# Patient Record
Sex: Female | Born: 1995
Health system: Southern US, Community
[De-identification: ages and names within clinical notes are randomized; demographics above are authoritative.]

## PROBLEM LIST (undated history)

## (undated) DIAGNOSIS — I498 Other specified cardiac arrhythmias: Secondary | ICD-10-CM

## (undated) DIAGNOSIS — I951 Orthostatic hypotension: Secondary | ICD-10-CM

## (undated) DIAGNOSIS — G90A Postural orthostatic tachycardia syndrome (POTS): Secondary | ICD-10-CM

## (undated) DIAGNOSIS — F319 Bipolar disorder, unspecified: Secondary | ICD-10-CM

## (undated) DIAGNOSIS — R000 Tachycardia, unspecified: Secondary | ICD-10-CM

## (undated) DIAGNOSIS — I341 Nonrheumatic mitral (valve) prolapse: Secondary | ICD-10-CM

## (undated) HISTORY — PX: EXTRACORPOREAL CIRCULATION: SHX266

## (undated) HISTORY — PX: COLPOSCOPY: SHX161

## (undated) HISTORY — DX: Nonrheumatic mitral (valve) prolapse: I34.1

---
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
## 2013-08-16 ENCOUNTER — Other Ambulatory Visit: Payer: Self-pay

## 2013-11-04 ENCOUNTER — Emergency Department (HOSPITAL_COMMUNITY)
Admission: EM | Admit: 2013-11-04 | Discharge: 2013-11-04 | Disposition: A | Discharge status: Home / Self Care | Payer: 59 | Attending: Emergency Medicine | Admitting: Emergency Medicine

## 2013-11-04 ENCOUNTER — Encounter (HOSPITAL_COMMUNITY): Payer: Self-pay | Admitting: Emergency Medicine

## 2013-11-04 DIAGNOSIS — Z3202 Encounter for pregnancy test, result negative: Secondary | ICD-10-CM | POA: Insufficient documentation

## 2013-11-04 DIAGNOSIS — Z8679 Personal history of other diseases of the circulatory system: Secondary | ICD-10-CM | POA: Insufficient documentation

## 2013-11-04 DIAGNOSIS — R45851 Suicidal ideations: Secondary | ICD-10-CM | POA: Insufficient documentation

## 2013-11-04 DIAGNOSIS — F319 Bipolar disorder, unspecified: Secondary | ICD-10-CM | POA: Insufficient documentation

## 2013-11-04 DIAGNOSIS — F39 Unspecified mood [affective] disorder: Secondary | ICD-10-CM | POA: Insufficient documentation

## 2013-11-04 DIAGNOSIS — IMO0002 Reserved for concepts with insufficient information to code with codable children: Secondary | ICD-10-CM | POA: Insufficient documentation

## 2013-11-04 DIAGNOSIS — F329 Major depressive disorder, single episode, unspecified: Secondary | ICD-10-CM | POA: Insufficient documentation

## 2013-11-04 DIAGNOSIS — F32A Depression, unspecified: Secondary | ICD-10-CM

## 2013-11-04 DIAGNOSIS — F3289 Other specified depressive episodes: Secondary | ICD-10-CM | POA: Insufficient documentation

## 2013-11-04 HISTORY — DX: Bipolar disorder, unspecified: F31.9

## 2013-11-04 LAB — POCT I-STAT, CHEM 8
BUN: 5 mg/dL — AB (ref 6–23)
CREATININE: 1 mg/dL (ref 0.50–1.10)
Calcium, Ion: 1.25 mmol/L — ABNORMAL HIGH (ref 1.12–1.23)
Chloride: 103 mEq/L (ref 96–112)
Glucose, Bld: 107 mg/dL — ABNORMAL HIGH (ref 70–99)
HCT: 44 % (ref 36.0–46.0)
Hemoglobin: 15 g/dL (ref 12.0–15.0)
Potassium: 3.5 mEq/L — ABNORMAL LOW (ref 3.7–5.3)
SODIUM: 146 meq/L (ref 137–147)
TCO2: 26 mmol/L (ref 0–100)

## 2013-11-04 LAB — CBC WITH DIFFERENTIAL/PLATELET
Basophils Absolute: 0 10*3/uL (ref 0.0–0.1)
Basophils Relative: 0 % (ref 0–1)
Eosinophils Absolute: 0.1 10*3/uL (ref 0.0–0.7)
Eosinophils Relative: 1 % (ref 0–5)
HCT: 38.8 % (ref 36.0–46.0)
Hemoglobin: 13.2 g/dL (ref 12.0–15.0)
LYMPHS ABS: 2 10*3/uL (ref 0.7–4.0)
LYMPHS PCT: 40 % (ref 12–46)
MCH: 29.7 pg (ref 26.0–34.0)
MCHC: 34 g/dL (ref 30.0–36.0)
MCV: 87.2 fL (ref 78.0–100.0)
Monocytes Absolute: 0.3 10*3/uL (ref 0.1–1.0)
Monocytes Relative: 7 % (ref 3–12)
NEUTROS ABS: 2.6 10*3/uL (ref 1.7–7.7)
NEUTROS PCT: 52 % (ref 43–77)
PLATELETS: 284 10*3/uL (ref 150–400)
RBC: 4.45 MIL/uL (ref 3.87–5.11)
RDW: 11.9 % (ref 11.5–15.5)
WBC: 5 10*3/uL (ref 4.0–10.5)

## 2013-11-04 LAB — ETHANOL: ALCOHOL ETHYL (B): 131 mg/dL — AB (ref 0–11)

## 2013-11-04 LAB — RAPID URINE DRUG SCREEN, HOSP PERFORMED
AMPHETAMINES: NOT DETECTED
BENZODIAZEPINES: NOT DETECTED
Barbiturates: NOT DETECTED
Cocaine: NOT DETECTED
OPIATES: NOT DETECTED
Tetrahydrocannabinol: NOT DETECTED

## 2013-11-04 LAB — PREGNANCY, URINE: Preg Test, Ur: NEGATIVE

## 2013-11-04 MED ORDER — ONDANSETRON HCL 4 MG PO TABS
4.0000 mg | ORAL_TABLET | Freq: Three times a day (TID) | ORAL | Status: DC | PRN
  Administered 2013-11-04: 4 mg via ORAL
  Filled 2013-11-04: qty 1

## 2013-11-04 MED ORDER — NICOTINE 21 MG/24HR TD PT24
21.0000 mg | MEDICATED_PATCH | Freq: Every day | TRANSDERMAL | Status: DC
  Filled 2013-11-04: qty 1

## 2013-11-04 NOTE — ED Provider Notes (Signed)
Medical screening examination/treatment/procedure(s) were performed by non-physician practitioner and as supervising physician I was immediately available for consultation/collaboration.    Kanija Remmel, MD 11/04/13 0714 

## 2013-11-04 NOTE — BH Assessment (Signed)
Clinician consulted with Alberteen SamFran Hobson NP who states pt does not meet criteria for inpatient admission. Clinician provided recommendation to Dr. Theodoro Kalataptiz who agreed. Pt contracted for safety with clinician and was discharged to follow up with Out-patient referral to [facility] where pt receives medication management services.  Victoria [name], MSW, LCSW Triage Specialist [phone]

## 2013-11-04 NOTE — ED Notes (Signed)
Per pt report: Pt c/o of feeling depressed for the past year.  Pt endorses ETOH use this evening. Pt currently denies SI/HI.  Pt denies pain.  Pt reports seeing psychiatry for control of her bipolar disorder.  Pt tearful in triage.

## 2013-11-04 NOTE — BH Assessment (Signed)
Clinician consulted with Dondra SpryGail, NP assigned to Pt for further information prior to assessment. Contacted assigned nurse to set up tele- assessment.  Assessment will be initiated.  Yaakov Guthrieelilah Stewart, MSW, LCSW Triage Specialist [phone]

## 2013-11-04 NOTE — ED Provider Notes (Signed)
CSN: [id number]     Arrival date & time 11/04/13  0210 History   First MD Initiated Contact with Patient 11/04/13 0254     Chief Complaint  Patient presents with  . Medical Clearance   (Consider location/radiation/quality/duration/timing/severity/associated sxs/prior Treatment) HPI Comments: This is a 18 year old female, who has had depression for the past year.  Tonight.  She drank alcohol She states she saw her therapist.  Burgess Estelle.  He was cutting down on her Risperdal has been making her too sleepy, but continued with her Lamictal at the same dosage.  She states this has been helping her, but she's been having crying episodes with increased depression for while. She can't put a trigger to her increased crying episodes.  She is suicidal without a specific plan  The history is provided by the patient.    Past Medical History  Diagnosis Date  . POTS (postural orthostatic tachycardia syndrome)   . Bipolar disorder    Past Surgical History  Procedure Laterality Date  . Extracorporeal circulation     No family history on file. History  Substance Use Topics  . Smoking status: Never Smoker   . Smokeless tobacco: Not on file  . Alcohol Use: No   OB History   Grav Para Term Preterm Abortions TAB SAB Ect Mult Living                 Review of Systems  Constitutional: Negative for fever.  Respiratory: Negative for cough and shortness of breath.   Cardiovascular: Negative for chest pain.  Neurological: Negative for headaches.  Psychiatric/Behavioral: Positive for suicidal ideas and dysphoric mood.  All other systems reviewed and are negative.    Allergies  Review of patient's allergies indicates no known allergies.  Home Medications   Current Outpatient Rx  Name  Route  Sig  Dispense  Refill  . fludrocortisone (FLORINEF) 0.1 MG tablet   Oral   Take 0.2 mg by mouth daily.          Marland Kitchen lamoTRIgine (LAMICTAL) 200 MG tablet   Oral   Take 200 mg by mouth at bedtime.         . risperiDONE (RISPERDAL) 1 MG tablet   Oral   Take 1 mg by mouth at bedtime.          BP 122/58  Pulse 109  Temp(Src) 98 F (36.7 C) (Oral)  Resp 18  SpO2 100%  LMP 10/07/2013 Physical Exam  Nursing note and vitals reviewed. Constitutional: She is oriented to person, place, and time. She appears well-developed and well-nourished. No distress.  HENT:  Head: Normocephalic and atraumatic.  Eyes: Pupils are equal, round, and reactive to light.  Neck: Normal range of motion.  Cardiovascular: Normal rate.   Pulmonary/Chest: Effort normal.  Musculoskeletal: Normal range of motion.  Neurological: She is alert and oriented to person, place, and time.  Skin: Skin is warm.  Psychiatric: Her speech is normal and behavior is normal. Judgment normal. Cognition and memory are normal. She exhibits a depressed mood. She expresses suicidal ideation. She expresses no suicidal plans.    ED Course  Procedures (including critical care time) Labs Review Labs Reviewed  ETHANOL - Abnormal; Notable for the following:    Alcohol, Ethyl (B) 131 (*)    All other components within normal limits  POCT I-STAT, CHEM 8 - Abnormal; Notable for the following:    Potassium 3.5 (*)    BUN 5 (*)    Glucose, Bld 107 (*)  Calcium, Ion 1.25 (*)    All other components within normal limits  CBC WITH DIFFERENTIAL  URINE RAPID DRUG SCREEN (HOSP PERFORMED)  PREGNANCY, URINE   Imaging Review No results found.  EKG Interpretation   None       MDM   1. Depression     Will have this patient evaluated by TTS TTS evaluation.  Patient contracts for safety.  I recommend she call her counselor first thing in the morning.  Return anytime she becomes concerned about her overall health, depression, increases, or she becomes suicidal   Arman FilterGail K Kenzlie Disch, NP 11/04/13 [phone]

## 2013-11-04 NOTE — Discharge Instructions (Signed)
Depression, Adult Depression is feeling sad, low, down in the dumps, blue, gloomy, or empty. In general, there are two kinds of depression:  Normal sadness or grief. This can happen after something upsetting. It often goes away on its own within 2 weeks. After losing a loved one (bereavement), normal sadness and grief may last longer than two weeks. It usually gets better with time.  Clinical depression. This kind lasts longer than normal sadness or grief. It keeps you from doing the things you normally do in life. It is often hard to function at home, work, or at school. It may affect your relationships with others. Treatment is often needed. GET HELP RIGHT AWAY IF:  You have thoughts about hurting yourself or others.  You lose touch with reality (psychotic symptoms). You may:  See or hear things that are not real.  Have untrue beliefs about your life or people around you.  Your medicine is giving you problems. MAKE SURE YOU:  Understand these instructions.  Will watch your condition.  Will get help right away if you are not doing well or get worse. Document Released: 10/30/2010 Document Revised: 06/21/2012 Document Reviewed: 01/27/2012 [facility] Patient Information 2014 [place]. Please call your counselor today.  Continue with your medication regime.  Return at anytime, you become acutely depressed or suicidal

## 2013-11-04 NOTE — ED Notes (Signed)
Pt states the Zofran helped her nausea

## 2013-11-04 NOTE — BH Assessment (Signed)
Tele Assessment Note   Marjory LiesMorgan M Sapp is an 18 y.o. female who [facility] depressive symptoms and SI.  At start of assessment Pt reported she was no longer experiencing SI.  She reported she was depressed and dx with anxiety and Bipolar. Pt reported her stressors were triggered by her fear of not performing well academically. Pt reported she was currently in school [facility] [employer]. She lives at home with her parents.  Pt reports she had some drinks tonight, which could have exacerbated her symptoms. She reported drinking about a 1/4 of bottle of liquor tonight.  Pt reported that for about a year now she has been depressed and experiencing symptoms of anxiety, increased sleep, decreased appetite, tearfulness, fatigue, guilt, anhedonia and irritability.  Pt reported experiencing anxiety attacks with most recent 1 day ago.    Pt was calm and cooperative.Pt was Ox4. Pt reported depressed mood with congruent affect.  Pt lives in the home with her parents.    Pt denied HI and any past suicide attempts. Pt denies A/V/H. She reports she is prescribed Risperdal and Lamictal [facility]  Pt denied past hospitalizations.  She reported having counseling at school which ended in November 2014.  Pt was able to contract for safety.  Axis I: Anxiety Disorder NOS and Major Depression, single episode Axis II: Deferred Axis III:  Past Medical History  Diagnosis Date  . POTS (postural orthostatic tachycardia syndrome)   . Bipolar disorder    Axis IV: academic concerns Axis V: 55  Past Medical History:  Past Medical History  Diagnosis Date  . POTS (postural orthostatic tachycardia syndrome)   . Bipolar disorder     Past Surgical History  Procedure Laterality Date  . Extracorporeal circulation      Family History: No family history on file.  Social History:  reports that she has never smoked. She does not have any smokeless tobacco history on file. She  reports that she drinks alcohol. She reports that she does not use illicit drugs.  Additional Social History:  Alcohol / Drug Use Pain Medications: none reported Prescriptions: Risperdal; Lamictal Over the Counter: none reported History of alcohol / drug use?: Yes Longest period of sobriety (when/how long): unk Substance #1 Name of Substance 1: ETOH 1 - Age of First Use: 16 1 - Amount (size/oz): 1/4 bottle of liquor 1 - Frequency: one occurence in a few months 1 - Duration: 9 months 1 - Last Use / Amount: 2 hours ago  CIWA: CIWA-Ar BP: 100/52 mmHg Pulse Rate: 92 COWS:    Allergies: No Known Allergies  Home Medications:  (Not in a hospital admission)  OB/GYN Status:  Patient's last menstrual period was 10/07/2013.  General Assessment Data Location of Assessment: [facility] Services Is this a Tele or Face-to-Face Assessment?: Tele Assessment Is this an Initial Assessment or a Re-assessment for this encounter?: Initial Assessment Living Arrangements: Parent Can pt return to current living arrangement?: Yes Admission Status: Voluntary Is patient capable of signing voluntary admission?: Yes Transfer from: Acute Hospital Referral Source: Self/Family/Friend  Medical Screening Exam [facility] Walk-in ONLY) Medical Exam completed: Yes  [facility] Living Arrangements: Parent Name of Psychiatrist:  (none) Name of Therapist:  (none)  Education Status Is patient currently in school?: Yes Current Grade:  (college courses) Highest grade of school patient has completed: 12 Name of school:  ([employer]) Contact person:  (none)  Risk to self Suicidal Ideation: No Suicidal Intent:  No Is patient at risk for suicide?: No Suicidal Plan?: No Access to Means: No What has been your use of drugs/alcohol within the last 12 months?:  (ETOH) Previous Attempts/Gestures: No How many times?: 0 Other Self Harm Risks:  (none) Triggers for Past Attempts:  (NA) Intentional Self  Injurious Behavior: None Family Suicide History: Unknown Recent stressful life event(s): Other (Comment) (Pt reports being stressed about academic performance. ) Persecutory voices/beliefs?: No Depression: Yes Depression Symptoms: Despondent;Tearfulness;Isolating;Fatigue;Guilt;Loss of interest in usual pleasures;Feeling worthless/self pity;Feeling angry/irritable Substance abuse history and/or treatment for substance abuse?: No Suicide prevention information given to non-admitted patients: Yes  Risk to Others Homicidal Ideation: No Thoughts of Harm to Others: No Current Homicidal Intent: No Current Homicidal Plan: No Access to Homicidal Means: No Identified Victim:  (NA) History of harm to others?: No Assessment of Violence: None Noted Violent Behavior Description:  (Pt was calm and cooperative. ) Does patient have access to weapons?: No Criminal Charges Pending?: No Does patient have a court date: No  Psychosis Hallucinations: None noted Delusions: None noted  Mental Status Report Appear/Hygiene:  (Pt wearing paper scrubs. ) Eye Contact: Good Motor Activity: Unremarkable Speech: Logical/coherent Level of Consciousness: Alert Mood: Depressed Affect: Depressed Anxiety Level: Moderate Thought Processes: Coherent;Relevant Judgement: Unimpaired Orientation: Person;Place;Situation;Time Obsessive Compulsive Thoughts/Behaviors: None  Cognitive Functioning Concentration: Normal Memory: Recent Intact;Remote Intact IQ: Average Insight: Fair Impulse Control: Fair Appetite:  (Pt reported decreased appetite. ) Weight Loss: 10 Weight Gain: 0 Sleep: Increased Total Hours of Sleep: 12 Vegetative Symptoms: None  ADLScreening [facility] Assessment Services) Patient's cognitive ability adequate to safely complete daily activities?: Yes Patient able to express need for assistance with ADLs?: Yes Independently performs ADLs?: Yes (appropriate for developmental age)  Prior Inpatient  Therapy Prior Inpatient Therapy: No Prior Therapy Dates:  (NA) Prior Therapy Facilty/Provider(s):  (NA) Reason for Treatment:  (NA)  Prior Outpatient Therapy Prior Outpatient Therapy: Yes Prior Therapy Dates:  (08/2013) Prior Therapy Facilty/Provider(s):  (Pt reported receving counseling at school.) Reason for Treatment:  (depression)  ADL Screening (condition at time of admission) Patient's cognitive ability adequate to safely complete daily activities?: Yes Is the patient deaf or have difficulty hearing?: No Does the patient have difficulty seeing, even when wearing glasses/contacts?: No Does the patient have difficulty concentrating, remembering, or making decisions?: No Patient able to express need for assistance with ADLs?: Yes Does the patient have difficulty dressing or bathing?: No Independently performs ADLs?: Yes (appropriate for developmental age) Does the patient have difficulty walking or climbing stairs?: No       Abuse/Neglect Assessment (Assessment to be complete while patient is alone) Physical Abuse: Denies Verbal Abuse: Denies Sexual Abuse: Denies Exploitation of patient/patient's resources: Denies Self-Neglect: Denies Values / Beliefs Cultural Requests During Hospitalization: None Spiritual Requests During Hospitalization: None   Advance Directives (For Healthcare) Advance Directive: Patient does not have advance directive    Additional Information 1:1 In Past 12 Months?: No CIRT Risk: No Elopement Risk: No Does patient have medical clearance?: Yes    Disposition: Clinician consulted with Alberteen Sam NP who states pt does not meet criteria for inpatient admission. Pt contracted for safety with clinician and was discharged to follow up with Out-patient referral to [facility] where pt receives medication management services. Yaakov Guthrie, MSW, LCSW Triage Specialist [phone]  Disposition:  Disposition Initial Assessment  Completed for this Encounter: Yes Disposition of Patient: Outpatient treatment Type of outpatient treatment: Adult  Stewart,Geraline Halberstadt R 11/04/2013 5:08 AM

## 2013-12-23 ENCOUNTER — Emergency Department (HOSPITAL_COMMUNITY)
Admission: EM | Admit: 2013-12-23 | Discharge: 2013-12-23 | Disposition: A | Discharge status: Home / Self Care | Payer: 59 | Attending: Emergency Medicine | Admitting: Emergency Medicine

## 2013-12-23 ENCOUNTER — Encounter (HOSPITAL_COMMUNITY): Payer: Self-pay | Admitting: Emergency Medicine

## 2013-12-23 DIAGNOSIS — IMO0002 Reserved for concepts with insufficient information to code with codable children: Secondary | ICD-10-CM | POA: Insufficient documentation

## 2013-12-23 DIAGNOSIS — I498 Other specified cardiac arrhythmias: Secondary | ICD-10-CM | POA: Insufficient documentation

## 2013-12-23 DIAGNOSIS — J069 Acute upper respiratory infection, unspecified: Secondary | ICD-10-CM

## 2013-12-23 DIAGNOSIS — R110 Nausea: Secondary | ICD-10-CM | POA: Insufficient documentation

## 2013-12-23 DIAGNOSIS — F319 Bipolar disorder, unspecified: Secondary | ICD-10-CM | POA: Insufficient documentation

## 2013-12-23 DIAGNOSIS — Z79899 Other long term (current) drug therapy: Secondary | ICD-10-CM | POA: Insufficient documentation

## 2013-12-23 NOTE — ED Provider Notes (Signed)
Medical screening examination/treatment/procedure(s) were performed by non-physician practitioner and as supervising physician I was immediately available for consultation/collaboration.   EKG Interpretation None        Richardean Canalavid H Tyshan Enderle, MD 12/23/13 2100

## 2013-12-23 NOTE — ED Notes (Signed)
Pt c/o URI s/s onset yesterday, with minimal nausea. Pt states she needs a note for work.

## 2013-12-23 NOTE — ED Provider Notes (Signed)
CSN: [id number]     Arrival date & time 12/23/13  1936 History  This chart was scribed for non-physician practitioner Marlon Peliffany Laurelle Skiver, PA-C working with Richardean Canalavid H Yao, MD by Dorothey Basemania Sutton, ED Scribe. This patient was seen in room WTR9/WTR9 and the patient's care was started at 7:51 PM.    Chief Complaint  Patient presents with  . URI   The history is provided by the patient. No language interpreter was used.   HPI Comments: Victoria [name] is a 18 y.o. female who presents to the Emergency Department complaining of URI-like symptoms including a mild, dry cough, congestion, sinus pressure, and general fatigue onset yesterday. She reports taking Mucinex at home without significant relief. She denies sore throat, fever, emesis. Patient is requesting a note for work. Patient has a history of postural orthostatic tachycardia syndrome and bipolar disorder.   Past Medical History  Diagnosis Date  . POTS (postural orthostatic tachycardia syndrome)   . Bipolar disorder    Past Surgical History  Procedure Laterality Date  . Extracorporeal circulation     No family history on file. History  Substance Use Topics  . Smoking status: Never Smoker   . Smokeless tobacco: Not on file  . Alcohol Use: Yes   OB History   Grav Para Term Preterm Abortions TAB SAB Ect Mult Living                 Review of Systems  Constitutional: Positive for fatigue. Negative for fever.  HENT: Positive for congestion and sinus pressure. Negative for sore throat.   Respiratory: Positive for cough.   Gastrointestinal: Positive for nausea. Negative for vomiting.  All other systems reviewed and are negative.   Allergies  Review of patient's allergies indicates no known allergies.  Home Medications   Current Outpatient Rx  Name  Route  Sig  Dispense  Refill  . fludrocortisone (FLORINEF) 0.1 MG tablet   Oral   Take 0.2 mg by mouth daily.          Marland Kitchen. lamoTRIgine (LAMICTAL) 200 MG tablet   Oral   Take 200 mg by mouth  at bedtime.         . risperiDONE (RISPERDAL) 1 MG tablet   Oral   Take 1 mg by mouth at bedtime.          Triage Vitals: BP 104/65  Pulse 79  Temp(Src) 97.9 F (36.6 C) (Oral)  Resp 18  SpO2 100%  LMP 11/25/2013  Physical Exam  Nursing note and vitals reviewed. Constitutional: She is oriented to person, place, and time. She appears well-developed and well-nourished. No distress.  HENT:  Head: Normocephalic and atraumatic.  Right Ear: Hearing, tympanic membrane, external ear and ear canal normal.  Left Ear: Hearing, tympanic membrane, external ear and ear canal normal.  Nose: Nose normal.  Mouth/Throat: Oropharynx is clear and moist. No oropharyngeal exudate.  Eyes: Conjunctivae are normal.  Neck: Normal range of motion. Neck supple.  Cardiovascular: Normal rate, regular rhythm and normal heart sounds.   Pulmonary/Chest: Effort normal and breath sounds normal. No respiratory distress.  Abdominal: She exhibits no distension.  Musculoskeletal: Normal range of motion.  Neurological: She is alert and oriented to person, place, and time.  Skin: Skin is warm and dry.  Psychiatric: She has a normal mood and affect. Her behavior is normal.    ED Course  Procedures (including critical care time)  DIAGNOSTIC STUDIES: Oxygen Saturation is 100% on room air, normal by my interpretation.  COORDINATION OF CARE: 7:54 PM- Discussed that symptoms are likely viral in nature. Advised patient to continue taking the Mucinex, but to increased her fluid intake. Patient is stable for discharge. Will provide a work note. Discussed treatment plan with patient at bedside and patient verbalized agreement.     Labs Review Labs Reviewed - No data to display Imaging Review No results found.   EKG Interpretation None      MDM   Final diagnoses:  URI (upper respiratory infection)    Patient really wants a work note. Feels fine now but didn't feel too great this morning when she  called out of work. Normal physical exam, is wearing sunglasses and a tank top.  17 y.o.Victoria [name]'s evaluation in the Emergency Department is complete. It has been determined that no acute conditions requiring further emergency intervention are present at this time. The patient/guardian have been advised of the diagnosis and plan. We have discussed signs and symptoms that warrant return to the ED, such as changes or worsening in symptoms.  Vital signs are stable at discharge. Filed Vitals:   12/23/13 1945  BP: 104/65  Pulse: 79  Temp: 97.9 F (36.6 C)  Resp: 18    Patient/guardian has voiced understanding and agreed to follow-up with the PCP or specialist.  I personally performed the services described in this documentation, which was scribed in my presence. The recorded information has been reviewed and is accurate.     Dorthula Matas, PA-C 12/23/13 2001

## 2013-12-23 NOTE — Discharge Instructions (Signed)

## 2014-01-07 DIAGNOSIS — F339 Major depressive disorder, recurrent, unspecified: Secondary | ICD-10-CM | POA: Insufficient documentation

## 2014-07-07 ENCOUNTER — Encounter (HOSPITAL_COMMUNITY): Payer: Self-pay | Admitting: Emergency Medicine

## 2014-07-07 ENCOUNTER — Emergency Department (INDEPENDENT_AMBULATORY_CARE_PROVIDER_SITE_OTHER)
Admission: EM | Admit: 2014-07-07 | Discharge: 2014-07-07 | Disposition: A | Discharge status: Home / Self Care | Payer: 59 | Source: Home / Self Care | Attending: Family Medicine | Admitting: Family Medicine

## 2014-07-07 DIAGNOSIS — J069 Acute upper respiratory infection, unspecified: Secondary | ICD-10-CM

## 2014-07-07 NOTE — Discharge Instructions (Signed)
Upper Respiratory Infection, Adult °An upper respiratory infection (URI) is also sometimes known as the common cold. The upper respiratory tract includes the nose, sinuses, throat, trachea, and bronchi. Bronchi are the airways leading to the lungs. Most people improve within 1 week, but symptoms can last up to 2 weeks. A residual cough may last even longer.  °CAUSES °Many different viruses can infect the tissues lining the upper respiratory tract. The tissues become irritated and inflamed and often become very moist. Mucus production is also common. A cold is contagious. You can easily spread the virus to others by oral contact. This includes kissing, sharing a glass, coughing, or sneezing. Touching your mouth or nose and then touching a surface, which is then touched by another person, can also spread the virus. °SYMPTOMS  °Symptoms typically develop 1 to 3 days after you come in contact with a cold virus. Symptoms vary from person to person. They may include: °· Runny nose. °· Sneezing. °· Nasal congestion. °· Sinus irritation. °· Sore throat. °· Loss of voice (laryngitis). °· Cough. °· Fatigue. °· Muscle aches. °· Loss of appetite. °· Headache. °· Low-grade fever. °DIAGNOSIS  °You might diagnose your own cold based on familiar symptoms, since most people get a cold 2 to 3 times a year. Your caregiver can confirm this based on your exam. Most importantly, your caregiver can check that your symptoms are not due to another disease such as strep throat, sinusitis, pneumonia, asthma, or epiglottitis. Blood tests, throat tests, and X-rays are not necessary to diagnose a common cold, but they may sometimes be helpful in excluding other more serious diseases. Your caregiver will decide if any further tests are required. °RISKS AND COMPLICATIONS  °You may be at risk for a more severe case of the common cold if you smoke cigarettes, have chronic heart disease (such as heart failure) or lung disease (such as asthma), or if  you have a weakened immune system. The very Zanaria Morell and very old are also at risk for more serious infections. Bacterial sinusitis, middle ear infections, and bacterial pneumonia can complicate the common cold. The common cold can worsen asthma and chronic obstructive pulmonary disease (COPD). Sometimes, these complications can require emergency medical care and may be life-threatening. °PREVENTION  °The best way to protect against getting a cold is to practice good hygiene. Avoid oral or hand contact with people with cold symptoms. Wash your hands often if contact occurs. There is no clear evidence that vitamin C, vitamin E, echinacea, or exercise reduces the chance of developing a cold. However, it is always recommended to get plenty of rest and practice good nutrition. °TREATMENT  °Treatment is directed at relieving symptoms. There is no cure. Antibiotics are not effective, because the infection is caused by a virus, not by bacteria. Treatment may include: °· Increased fluid intake. Sports drinks offer valuable electrolytes, sugars, and fluids. °· Breathing heated mist or steam (vaporizer or shower). °· Eating chicken soup or other clear broths, and maintaining good nutrition. °· Getting plenty of rest. °· Using gargles or lozenges for comfort. °· Controlling fevers with ibuprofen or acetaminophen as directed by your caregiver. °· Increasing usage of your inhaler if you have asthma. °Zinc gel and zinc lozenges, taken in the first 24 hours of the common cold, can shorten the duration and lessen the severity of symptoms. Pain medicines may help with fever, muscle aches, and throat pain. A variety of non-prescription medicines are available to treat congestion and runny nose. Your caregiver   can make recommendations and may suggest nasal or lung inhalers for other symptoms.  HOME CARE INSTRUCTIONS   Only take over-the-counter or prescription medicines for pain, discomfort, or fever as directed by your  caregiver.  Use a warm mist humidifier or inhale steam from a shower to increase air moisture. This may keep secretions moist and make it easier to breathe.  Drink enough water and fluids to keep your urine clear or pale yellow.  Rest as needed.  Return to work when your temperature has returned to normal or as your caregiver advises. You may need to stay home longer to avoid infecting others. You can also use a face mask and careful hand washing to prevent spread of the virus. SEEK MEDICAL CARE IF:   After the first few days, you feel you are getting worse rather than better.  You need your caregiver's advice about medicines to control symptoms.  You develop chills, worsening shortness of breath, or brown or red sputum. These may be signs of pneumonia.  You develop yellow or brown nasal discharge or pain in the face, especially when you bend forward. These may be signs of sinusitis.  You develop a fever, swollen neck glands, pain with swallowing, or white areas in the back of your throat. These may be signs of strep throat. SEEK IMMEDIATE MEDICAL CARE IF:   You have a fever.  You develop severe or persistent headache, ear pain, sinus pain, or chest pain.  You develop wheezing, a prolonged cough, cough up blood, or have a change in your usual mucus (if you have chronic lung disease).  You develop sore muscles or a stiff neck. Document Released: 03/23/2001 Document Revised: 12/20/2011 Document Reviewed: 01/02/2014 [facility] Patient Information 2015 [place], [place]. This information is not intended to replace advice given to you by your health care provider. Make sure you discuss any questions you have with your health care provider.    Rest, lots of fluids. Sudafed as directed (buy behind the counter), claritin as directed with 4 Motrin or Ibuprofen. Usually passes within 4-5 days. If worsens return.

## 2014-07-07 NOTE — ED Provider Notes (Signed)
Medical screening examination/treatment/procedure(s) were performed by non-physician practitioner and as supervising physician I was immediately available for consultation/collaboration.  Leslee Home, M.D.  Reuben Likes, MD 07/07/14 [phone]

## 2014-07-07 NOTE — ED Provider Notes (Signed)
CSN: [id number]     Arrival date & time 07/07/14  1632 History   First MD Initiated Contact with Patient 07/07/14 1637     Chief Complaint  Patient presents with  . URI   (Consider location/radiation/quality/duration/timing/severity/associated sxs/prior Treatment) HPI Comments: Patient presents with nasal congestion x 2 days. Completed abx for tonsillitis last week. No fever or chills. No return of sore throat. No malase. No cough.   Patient is a 18 y.o. female presenting with URI. The history is provided by the patient.  URI Presenting symptoms: congestion   Presenting symptoms: no cough, no ear pain, no fever and no sore throat   Associated symptoms: no sneezing and no wheezing     Past Medical History  Diagnosis Date  . POTS (postural orthostatic tachycardia syndrome)   . Bipolar disorder    Past Surgical History  Procedure Laterality Date  . Extracorporeal circulation     History reviewed. No pertinent family history. History  Substance Use Topics  . Smoking status: Never Smoker   . Smokeless tobacco: Not on file  . Alcohol Use: Yes   OB History   Grav Para Term Preterm Abortions TAB SAB Ect Mult Living                 Review of Systems  Constitutional: Negative for fever and chills.  HENT: Positive for congestion and postnasal drip. Negative for ear pain, sinus pressure, sneezing and sore throat.   Eyes: Negative.   Respiratory: Negative for cough and wheezing.   Skin: Negative.   Allergic/Immunologic: Negative.     Allergies  Review of patient's allergies indicates no known allergies.  Home Medications   Prior to Admission medications   Medication Sig Start Date End Date Taking? Authorizing Provider  fludrocortisone (FLORINEF) 0.1 MG tablet Take 0.2 mg by mouth daily.    Yes Historical Provider, MD  risperiDONE (RISPERDAL) 1 MG tablet Take 1 mg by mouth at bedtime.   Yes Historical Provider, MD  lamoTRIgine (LAMICTAL) 200 MG tablet Take 200 mg by mouth at  bedtime.    Historical Provider, MD   BP 106/66  Pulse 106  Temp(Src) 98.4 F (36.9 C) (Oral)  Resp 16  SpO2 96%  LMP 07/07/2014 Physical Exam  Nursing note and vitals reviewed. Constitutional: She is oriented to person, place, and time. She appears well-developed and well-nourished. No distress.  HENT:  Head: Normocephalic and atraumatic.  Right Ear: External ear normal.  Left Ear: External ear normal.  Mild oropharynx injection. Mild turbinate erythema and swelling. Clear drainage  Eyes: Pupils are equal, round, and reactive to light.  Neck: Normal range of motion.  Cardiovascular: Normal rate, regular rhythm and normal heart sounds.   Pulmonary/Chest: Effort normal and breath sounds normal. She has no wheezes.  Lymphadenopathy:    She has no cervical adenopathy.  Neurological: She is alert and oriented to person, place, and time.  Skin: Skin is warm and dry. No rash noted.  Psychiatric: Her behavior is normal.    ED Course  Procedures (including critical care time) Labs Review Labs Reviewed - No data to display  Imaging Review No results found.   MDM   1. URI (upper respiratory infection)    No indication for an antibiotic. Symptomatic treatment only. F/U if worsens. Education given.     Riki Sheer, PA-C 07/07/14 1700

## 2014-07-07 NOTE — ED Notes (Signed)
C/o  Sinus pressure and pain.  Finished antibiotic for tonsillitis last week and states two days after finishing meds started feeling sick again.  Denies fever, n/v/d.   No otc treatments tried.

## 2016-03-24 ENCOUNTER — Encounter (HOSPITAL_COMMUNITY): Payer: Self-pay | Admitting: *Deleted

## 2016-03-24 DIAGNOSIS — R1032 Left lower quadrant pain: Secondary | ICD-10-CM | POA: Insufficient documentation

## 2016-03-24 DIAGNOSIS — M545 Low back pain: Secondary | ICD-10-CM | POA: Insufficient documentation

## 2016-03-24 DIAGNOSIS — Z79899 Other long term (current) drug therapy: Secondary | ICD-10-CM | POA: Insufficient documentation

## 2016-03-24 DIAGNOSIS — F319 Bipolar disorder, unspecified: Secondary | ICD-10-CM | POA: Insufficient documentation

## 2016-03-24 LAB — URINALYSIS, ROUTINE W REFLEX MICROSCOPIC
Bilirubin Urine: NEGATIVE
Glucose, UA: NEGATIVE mg/dL
KETONES UR: NEGATIVE mg/dL
LEUKOCYTES UA: NEGATIVE
NITRITE: NEGATIVE
PH: 5.5 (ref 5.0–8.0)
Protein, ur: NEGATIVE mg/dL
SPECIFIC GRAVITY, URINE: 1.008 (ref 1.005–1.030)

## 2016-03-24 LAB — COMPREHENSIVE METABOLIC PANEL
ALT: 13 U/L — ABNORMAL LOW (ref 14–54)
ANION GAP: 6 (ref 5–15)
AST: 24 U/L (ref 15–41)
Albumin: 4.1 g/dL (ref 3.5–5.0)
Alkaline Phosphatase: 82 U/L (ref 38–126)
BILIRUBIN TOTAL: 0.6 mg/dL (ref 0.3–1.2)
BUN: 7 mg/dL (ref 6–20)
CHLORIDE: 104 mmol/L (ref 101–111)
CO2: 27 mmol/L (ref 22–32)
Calcium: 9.3 mg/dL (ref 8.9–10.3)
Creatinine, Ser: 0.82 mg/dL (ref 0.44–1.00)
Glucose, Bld: 100 mg/dL — ABNORMAL HIGH (ref 65–99)
POTASSIUM: 4 mmol/L (ref 3.5–5.1)
Sodium: 137 mmol/L (ref 135–145)
TOTAL PROTEIN: 7.2 g/dL (ref 6.5–8.1)

## 2016-03-24 LAB — CBC
HEMATOCRIT: 41.4 % (ref 36.0–46.0)
HEMOGLOBIN: 13.7 g/dL (ref 12.0–15.0)
MCH: 29.7 pg (ref 26.0–34.0)
MCHC: 33.1 g/dL (ref 30.0–36.0)
MCV: 89.8 fL (ref 78.0–100.0)
Platelets: 352 10*3/uL (ref 150–400)
RBC: 4.61 MIL/uL (ref 3.87–5.11)
RDW: 12.6 % (ref 11.5–15.5)
WBC: 8 10*3/uL (ref 4.0–10.5)

## 2016-03-24 LAB — URINE MICROSCOPIC-ADD ON

## 2016-03-24 LAB — LIPASE, BLOOD: LIPASE: 25 U/L (ref 11–51)

## 2016-03-24 NOTE — ED Notes (Signed)
The pt ic c/o lower back and lower abd pain for 2 days with nausea and some diarrhea today  lmp

## 2016-03-25 ENCOUNTER — Emergency Department (HOSPITAL_COMMUNITY)
Admission: EM | Admit: 2016-03-25 | Discharge: 2016-03-25 | Disposition: A | Discharge status: Home / Self Care | Payer: Self-pay | Attending: Emergency Medicine | Admitting: Emergency Medicine

## 2016-03-25 DIAGNOSIS — M545 Low back pain, unspecified: Secondary | ICD-10-CM

## 2016-03-25 LAB — GC/CHLAMYDIA PROBE AMP (~~LOC~~) NOT AT ARMC
CHLAMYDIA, DNA PROBE: NEGATIVE
NEISSERIA GONORRHEA: NEGATIVE

## 2016-03-25 LAB — PREGNANCY, URINE: PREG TEST UR: NEGATIVE

## 2016-03-25 LAB — WET PREP, GENITAL
Clue Cells Wet Prep HPF POC: NONE SEEN
Sperm: NONE SEEN
Trich, Wet Prep: NONE SEEN
YEAST WET PREP: NONE SEEN

## 2016-03-25 MED ORDER — ONDANSETRON 4 MG PO TBDP
4.0000 mg | ORAL_TABLET | Freq: Once | ORAL | Status: AC
  Administered 2016-03-25: 4 mg via ORAL
  Filled 2016-03-25: qty 1

## 2016-03-25 MED ORDER — AZITHROMYCIN 250 MG PO TABS
1000.0000 mg | ORAL_TABLET | Freq: Once | ORAL | Status: AC
  Administered 2016-03-25: 1000 mg via ORAL
  Filled 2016-03-25: qty 4

## 2016-03-25 MED ORDER — ONDANSETRON HCL 4 MG PO TABS: 4.0000 mg | ORAL_TABLET | Freq: Four times a day (QID) | ORAL | Status: DC

## 2016-03-25 MED ORDER — CEFTRIAXONE SODIUM 250 MG IJ SOLR
250.0000 mg | Freq: Once | INTRAMUSCULAR | Status: AC
  Administered 2016-03-25: 250 mg via INTRAMUSCULAR
  Filled 2016-03-25: qty 250

## 2016-03-25 MED ORDER — OXYCODONE-ACETAMINOPHEN 5-325 MG PO TABS
1.0000 | ORAL_TABLET | Freq: Once | ORAL | Status: AC
  Administered 2016-03-25: 1 via ORAL
  Filled 2016-03-25: qty 1

## 2016-03-25 MED ORDER — TRAMADOL HCL 50 MG PO TABS: 50.0000 mg | ORAL_TABLET | Freq: Four times a day (QID) | ORAL | Status: DC | PRN

## 2016-03-25 NOTE — Discharge Instructions (Signed)

## 2016-03-25 NOTE — ED Notes (Signed)
Pelvic cart ready, pt placed in gown.

## 2016-03-25 NOTE — ED Provider Notes (Signed)
CSN: [id number]     Arrival date & time 03/24/16  1959 History   First MD Initiated Contact with Patient 03/25/16 0124     Chief Complaint  Patient presents with  . Abdominal Pain     (Consider location/radiation/quality/duration/timing/severity/associated sxs/prior Treatment) HPI  This is a 20 year old Caucasian female who presents to the emergency department with complaints of bilateral low back pain and abdominal pain. She states that the back pain came on randomly two days ago and describes the pain as an "intense ache" that radiates to her lower abdomen bilaterally. The pain is constant and does not improve with Ibuprofen. She is not able to reproduce the pain. She rates the pain a 7/10. She also reports nausea and one episode of diarrhea today. She denies urinary symptoms, vomiting, fever, chills, and trauma.  She is currently on her menstrual cycle and states that she frequently has cramping associated with her period, but this pain is different from what she typically experiences.  Past Medical History  Diagnosis Date  . POTS (postural orthostatic tachycardia syndrome)   . Bipolar disorder [facility](HCC)    Past Surgical History  Procedure Laterality Date  . Extracorporeal circulation     No family history on file. Social History  Substance Use Topics  . Smoking status: Never Smoker   . Smokeless tobacco: None  . Alcohol Use: Yes   OB History    No data available     Review of Systems  Review of Systems All other systems negative except as documented in the HPI. All pertinent positives and negatives as reviewed in the HPI.   Allergies  Review of patient's allergies indicates no known allergies.  Home Medications   Prior to Admission medications   Medication Sig Start Date End Date Taking? Authorizing Provider  ondansetron (ZOFRAN) 4 MG tablet Take 1 tablet (4 mg total) by mouth every 6 (six) hours. 03/25/16   Casen Pryor Neva SeatGreene, PA-C  traMADol (ULTRAM) 50 MG tablet Take 1  tablet (50 mg total) by mouth every 6 (six) hours as needed. 03/25/16   Briselda Naval Neva SeatGreene, PA-C   BP 107/61 mmHg  Pulse 78  Temp(Src) 97.5 F (36.4 C) (Oral)  Resp 14  Ht 5\' 4"  (1.626 m)  Wt 74.617 kg  BMI 28.22 kg/m2  SpO2 99%  LMP 03/24/2016 Physical Exam  Constitutional: She appears well-developed and well-nourished. No distress.  HENT:  Head: Normocephalic and atraumatic.  Left Ear: External ear and ear canal normal.  Nose: Nose normal.  Mouth/Throat: Uvula is midline, oropharynx is clear and moist and mucous membranes are normal.  Eyes: Pupils are equal, round, and reactive to light.  Neck: Normal range of motion. Neck supple.  Cardiovascular: Normal rate and regular rhythm.   Pulmonary/Chest: Effort normal.  Abdominal: Soft. Bowel sounds are normal. She exhibits no distension. There is no hepatosplenomegaly. There is no tenderness. There is no rigidity, no rebound, no guarding and no CVA tenderness.  No signs of abdominal distention  Genitourinary: Vagina normal. There is no tenderness on the right labia. There is no tenderness on the left labia.  Musculoskeletal:  No LE swelling  Neurological: She is alert.  Acting at baseline  Skin: Skin is warm and dry. No rash noted.  Nursing note and vitals reviewed.   ED Course  Procedures (including critical care time) Labs Review Labs Reviewed  WET PREP, GENITAL - Abnormal; Notable for the following:    WBC, Wet Prep HPF POC MANY (*)  All other components within normal limits  COMPREHENSIVE METABOLIC PANEL - Abnormal; Notable for the following:    Glucose, Bld 100 (*)    ALT 13 (*)    All other components within normal limits  URINALYSIS, ROUTINE W REFLEX MICROSCOPIC (NOT AT [facility]) - Abnormal; Notable for the following:    Hgb urine dipstick LARGE (*)    All other components within normal limits  URINE MICROSCOPIC-ADD ON - Abnormal; Notable for the following:    Squamous Epithelial / LPF 0-5 (*)    Bacteria, UA RARE (*)     All other components within normal limits  LIPASE, BLOOD  CBC  PREGNANCY, URINE  GC/CHLAMYDIA PROBE AMP ([place]) NOT AT [facility]    Imaging Review No results found. I have personally reviewed and evaluated these images and lab results as part of my medical decision-making.   EKG Interpretation None      MDM   Final diagnoses:  Bilateral low back pain without sciatica   Medications  oxyCODONE-acetaminophen (PERCOCET/ROXICET) 5-325 MG per tablet 1 tablet (1 tablet Oral Given 03/25/16 0313)  azithromycin (ZITHROMAX) tablet 1,000 mg (1,000 mg Oral Given 03/25/16 0457)  cefTRIAXone (ROCEPHIN) injection 250 mg (250 mg Intramuscular Given 03/25/16 0454)  ondansetron (ZOFRAN-ODT) disintegrating tablet 4 mg (4 mg Oral Given 03/25/16 0453)   Patient does not have any abdominal pain on physical exam. Unclear what the etiology of patients pain is. She has WBC on wet prep. Treated with Azithromycin and Rocephin in ED.  Discussed with patient that cultures have been sent out. Will give pain and nausea medication PRN. She is to follow-up with her PCP or Ob/Gyn provider.  I discussed results, diagnoses and plan with [facility] Better Sapp. They voice there understanding and questions were answered. We discussed follow-up recommendations and return precautions.    Marlon Pel, PA-C 03/25/16 1610  Zadie Rhine, MD 03/25/16 [phone]

## 2016-03-25 NOTE — ED Notes (Signed)
Discharge instructions and prescriptions reviewed - voiced understanding 

## 2016-05-01 ENCOUNTER — Emergency Department (HOSPITAL_COMMUNITY)
Admission: EM | Admit: 2016-05-01 | Discharge: 2016-05-02 | Disposition: A | Discharge status: Home / Self Care | Payer: Self-pay | Attending: Emergency Medicine | Admitting: Emergency Medicine

## 2016-05-01 DIAGNOSIS — W228XXA Striking against or struck by other objects, initial encounter: Secondary | ICD-10-CM | POA: Insufficient documentation

## 2016-05-01 DIAGNOSIS — Y939 Activity, unspecified: Secondary | ICD-10-CM | POA: Insufficient documentation

## 2016-05-01 DIAGNOSIS — S0093XA Contusion of unspecified part of head, initial encounter: Secondary | ICD-10-CM | POA: Insufficient documentation

## 2016-05-01 DIAGNOSIS — S060X1A Concussion with loss of consciousness of 30 minutes or less, initial encounter: Secondary | ICD-10-CM | POA: Insufficient documentation

## 2016-05-01 DIAGNOSIS — T148XXA Other injury of unspecified body region, initial encounter: Secondary | ICD-10-CM

## 2016-05-01 DIAGNOSIS — Y929 Unspecified place or not applicable: Secondary | ICD-10-CM | POA: Insufficient documentation

## 2016-05-01 DIAGNOSIS — F319 Bipolar disorder, unspecified: Secondary | ICD-10-CM | POA: Insufficient documentation

## 2016-05-01 DIAGNOSIS — Y999 Unspecified external cause status: Secondary | ICD-10-CM | POA: Insufficient documentation

## 2016-05-01 MED ORDER — FENTANYL CITRATE (PF) 100 MCG/2ML IJ SOLN
75.0000 ug | Freq: Once | INTRAMUSCULAR | Status: AC
  Administered 2016-05-02: 75 ug via INTRAMUSCULAR
  Filled 2016-05-01: qty 2
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
## 2016-05-01 NOTE — ED Notes (Signed)
MD at bedside completing full physical assessment. Patient shows no signs of acute distress at this time.

## 2016-05-02 ENCOUNTER — Emergency Department (HOSPITAL_COMMUNITY): Payer: Self-pay

## 2016-05-02 LAB — POC URINE PREG, ED: PREG TEST UR: NEGATIVE

## 2016-05-02 NOTE — ED Notes (Signed)
Patient ambulatory to restroom with steady gait. Denies dizziness, lightheadedness at this time.

## 2016-05-02 NOTE — ED Notes (Signed)
See downtime notes.
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
## 2017-04-27 ENCOUNTER — Encounter: Payer: Self-pay | Admitting: Adult Health

## 2018-04-11 DIAGNOSIS — F331 Major depressive disorder, recurrent, moderate: Secondary | ICD-10-CM | POA: Insufficient documentation

## 2018-06-30 ENCOUNTER — Emergency Department (HOSPITAL_COMMUNITY)
Admission: EM | Admit: 2018-06-30 | Discharge: 2018-07-01 | Discharge status: Against Medical Advice | Payer: BLUE CROSS/BLUE SHIELD | Attending: Emergency Medicine | Admitting: Emergency Medicine

## 2018-06-30 ENCOUNTER — Encounter (HOSPITAL_COMMUNITY): Payer: Self-pay | Admitting: Emergency Medicine

## 2018-06-30 ENCOUNTER — Ambulatory Visit (HOSPITAL_COMMUNITY)
Admission: RE | Admit: 2018-06-30 | Discharge: 2018-06-30 | Disposition: A | Discharge status: Home / Self Care | Payer: BLUE CROSS/BLUE SHIELD | Source: Home / Self Care | Attending: Psychiatry | Admitting: Psychiatry

## 2018-06-30 DIAGNOSIS — F329 Major depressive disorder, single episode, unspecified: Secondary | ICD-10-CM | POA: Insufficient documentation

## 2018-06-30 DIAGNOSIS — R45851 Suicidal ideations: Secondary | ICD-10-CM

## 2018-06-30 DIAGNOSIS — Z79899 Other long term (current) drug therapy: Secondary | ICD-10-CM | POA: Diagnosis not present

## 2018-06-30 DIAGNOSIS — F32A Depression, unspecified: Secondary | ICD-10-CM
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
## 2018-06-30 NOTE — ED Triage Notes (Signed)
[facility],  Pt. Stated having active thoughts of hurting herself and being suicidal  for the past 4 days., denied HI. Alert and oriented x 4, calm and cooperative.

## 2018-06-30 NOTE — ED Notes (Signed)
Bed: WTR6 Expected date:  Expected time:  Means of arrival:  Comments: 

## 2018-07-01 DIAGNOSIS — F332 Major depressive disorder, recurrent severe without psychotic features: Secondary | ICD-10-CM | POA: Insufficient documentation

## 2018-07-01 DIAGNOSIS — F411 Generalized anxiety disorder: Secondary | ICD-10-CM | POA: Insufficient documentation

## 2018-07-01 LAB — CBC WITH DIFFERENTIAL/PLATELET
BASOS PCT: 0 %
Basophils Absolute: 0 10*3/uL (ref 0.0–0.1)
EOS ABS: 0 10*3/uL (ref 0.0–0.7)
Eosinophils Relative: 0 %
HEMATOCRIT: 44.5 % (ref 36.0–46.0)
Hemoglobin: 14.5 g/dL (ref 12.0–15.0)
Lymphocytes Relative: 17 %
Lymphs Abs: 1.8 10*3/uL (ref 0.7–4.0)
MCH: 30.4 pg (ref 26.0–34.0)
MCHC: 32.6 g/dL (ref 30.0–36.0)
MCV: 93.3 fL (ref 78.0–100.0)
MONOS PCT: 5 %
Monocytes Absolute: 0.5 10*3/uL (ref 0.1–1.0)
NEUTROS ABS: 8.2 10*3/uL — AB (ref 1.7–7.7)
Neutrophils Relative %: 78 %
PLATELETS: 368 10*3/uL (ref 150–400)
RBC: 4.77 MIL/uL (ref 3.87–5.11)
RDW: 13.1 % (ref 11.5–15.5)
WBC: 10.5 10*3/uL (ref 4.0–10.5)

## 2018-07-01 LAB — COMPREHENSIVE METABOLIC PANEL
ALBUMIN: 4.2 g/dL (ref 3.5–5.0)
ALK PHOS: 88 U/L (ref 38–126)
ALT: 23 U/L (ref 0–44)
AST: 20 U/L (ref 15–41)
Anion gap: 10 (ref 5–15)
BILIRUBIN TOTAL: 0.3 mg/dL (ref 0.3–1.2)
BUN: 12 mg/dL (ref 6–20)
CALCIUM: 9.6 mg/dL (ref 8.9–10.3)
CO2: 28 mmol/L (ref 22–32)
CREATININE: 0.88 mg/dL (ref 0.44–1.00)
Chloride: 107 mmol/L (ref 98–111)
GFR calc Af Amer: >60 mL/min (ref >60)
GLUCOSE: 115 mg/dL — AB (ref 70–99)
Potassium: 4.1 mmol/L (ref 3.5–5.1)
Sodium: 145 mmol/L (ref 135–145)
TOTAL PROTEIN: 7.6 g/dL (ref 6.5–8.1)

## 2018-07-01 LAB — SALICYLATE LEVEL: Salicylate Lvl: <7 mg/dL (ref 2.8–30.0)

## 2018-07-01 LAB — I-STAT BETA HCG BLOOD, ED (MC, WL, AP ONLY): I-stat hCG, quantitative: <5 m[IU]/mL (ref <5)

## 2018-07-01 LAB — ACETAMINOPHEN LEVEL: Acetaminophen (Tylenol), Serum: <10 ug/mL — ABNORMAL LOW (ref 10–30)

## 2018-07-01 LAB — ETHANOL: Alcohol, Ethyl (B): <10 mg/dL (ref <10)

## 2018-07-01 NOTE — [facility] Counselor (Signed)
Attempted to follow up with Mrs. Victoria [name] at [phone] and left HIPPA compliant voicemail message with return telephone number and a request to contact Counselor.  Attempted to follow up with Mr. Victoria [name] at [phone] and left HIPPA compliant voicemail message with a return telephone number and a request to contact this Counselor

## 2018-07-01 NOTE — H&P (Signed)
Behavioral Health Medical Screening Exam  Victoria [name] is an 22 y.o. female.The pt came in due to having suicidal thoughts.  The pt stopped her medication (lithium and Mirapex) 3 weeks ago.  Since she stopped taking the medication, her depression has increased and she has mentioned to her husband thoughts of wanting to crash her car into a tree.  The pt has one suicide attempt 2 years ago, when she overdosed on pills.  [facility].  The pt stated her biggest stressor [place].  The pt is currently going to [facility].    Total Time spent with patient: 15 minutes  Psychiatric Specialty Exam: Physical Exam  Constitutional: She is oriented to person, place, and time. She appears well-developed and well-nourished. No distress.  HENT:  Head: Normocephalic.  Eyes: Pupils are equal, round, and reactive to light.  Respiratory: Effort normal and breath sounds normal. No respiratory distress.  Neurological: She is alert and oriented to person, place, and time. No cranial nerve deficit.  Skin: Skin is warm and dry. She is not diaphoretic.  Psychiatric: Her speech is normal. She is withdrawn. Thought content is paranoid. Cognition and memory are normal. She expresses impulsivity. She exhibits a depressed mood. She expresses suicidal ideation. She expresses suicidal plans.    Review of Systems  Constitutional: Negative for chills, diaphoresis, fever, malaise/fatigue and weight loss.  Cardiovascular: Negative for chest pain and palpitations.  Gastrointestinal: Negative for nausea and vomiting.  Psychiatric/Behavioral: Positive for depression and suicidal ideas. Negative for hallucinations and substance abuse. The patient is not nervous/anxious and does not have insomnia.     Last menstrual period 06/26/2018.There is no height or weight on file to calculate BMI.  General Appearance: Casual  Eye Contact:  Fair  Speech:  Clear  and Coherent  Volume:  Normal  Mood:  Depressed  Affect:  Congruent  Thought Process:  Goal Directed  Orientation:  Full (Time, Place, and Person)  Thought Content:  Logical  Suicidal Thoughts:  Yes.  with intent/plan  Homicidal Thoughts:  No  Memory:  Immediate;   Fair  Judgement:  Fair  Insight:  Fair  Psychomotor Activity:  Normal  Concentration: Concentration: Fair  Recall:  [employer]Fair  Fund of Knowledge:Fair  Language: Fair  Akathisia:  Negative  Handed:  Right  AIMS (if indicated):     Assets:  Desire for Improvement  Sleep:       Musculoskeletal: Strength & Muscle Tone: within normal limits Gait & Station: normal Patient leans: N/A  Last menstrual period 06/26/2018.  Recommendations:  Based on my evaluation the patient does not appear to have an emergency medical condition.  Victoria HoughSpencer E Lavenia Stumpo, PA-C 07/01/2018, 3:14 AM

## 2018-07-01 NOTE — [facility] Counselor (Signed)
[id number]:  Attempted to follow up with Mrs. Victoria [name] at [phone] and left HIPPA compliant voicemail message with return telephone number and a request to contact Counselor.  Attempted to follow up with Mr. Victoria [name] at [phone] and left HIPPA compliant voicemail message with a return telephone number and a request to contact this Counselor   07-01-2018/1520:  Attempted to contact Mrs. Victoria [name] and Mr. Victoria [name] again after receiving no response from voicemail messages left this morning.

## 2018-07-01 NOTE — ED Notes (Signed)
While with another patient, patient has seemed to have left. No one present in room and family member gone.

## 2018-07-01 NOTE — ED Notes (Signed)
[facility]  Outpt services to offered to pt in the AM

## 2018-07-01 NOTE — ED Notes (Signed)
Call to pt residence @ [phone] spoke with female who identified himself as the pt spouse and he stated the pt was sleep. I attempted to explain to him the process involved with medical clearance when he repeatedly talked over what ai was trying to say. Stated whe were not happy with the service and that he felt more harm than good and that they would not return for any services in the future. [facility] contacted

## 2018-07-01 NOTE — ED Provider Notes (Signed)
[name]-EMERGENCY DEPT Provider Note   CSN: [id number] Arrival date & time: 06/30/18  2233     History   Chief Complaint Chief Complaint  Patient presents with  . Suicidal    HPI Victoria [name] is a 22 y.o. female.  Victoria [name] is a 22 y.o. Female history of POTS, bipolar disorder, depression and anxiety, who presents to the emergency department from behavioral health for medical clearance.  Patient reports she has been having active thoughts of hurting herself without specific plan for the past 4 days.  She denies any HI or AVH.  Reports increasing stressors in her life including recent move contributing to these worsening thoughts.  She reports she was on medication to manage this but has been without these for the past 3 weeks and also thinks this has made things worse.  She reports occasional alcohol use but denies any other substance abuse.  Denies any focal medical complaints today, no fevers or recent illness, no chest pain or shortness of breath, no abdominal pain, no headaches or vision changes.     Past Medical History:  Diagnosis Date  . Bipolar disorder (HCC)   . POTS (postural orthostatic tachycardia syndrome)     There are no active problems to display for this patient.   Past Surgical History:  Procedure Laterality Date  . EXTRACORPOREAL CIRCULATION       OB History   None      Home Medications    Prior to Admission medications   Medication Sig Start Date End Date Taking? Authorizing Provider  ondansetron (ZOFRAN) 4 MG tablet Take 1 tablet (4 mg total) by mouth every 6 (six) hours. 03/25/16   Marlon PelGreene, Tiffany, PA-C  traMADol (ULTRAM) 50 MG tablet Take 1 tablet (50 mg total) by mouth every 6 (six) hours as needed. 03/25/16   Marlon PelGreene, Tiffany, PA-C    Family History No family history on file.  Social History Social History   Tobacco Use  . Smoking status: Never Smoker  Substance Use Topics  . Alcohol use: Yes  .  Drug use: No     Allergies   Patient has no known allergies.   Review of Systems Review of Systems  Constitutional: Negative for chills and fever.  HENT: Negative.   Eyes: Negative for visual disturbance.  Respiratory: Negative for cough and shortness of breath.   Cardiovascular: Negative for chest pain.  Gastrointestinal: Negative for abdominal pain.  Genitourinary: Negative for dysuria.  Musculoskeletal: Negative for arthralgias and myalgias.  Skin: Negative for color change and rash.  Neurological: Negative for headaches.  Psychiatric/Behavioral: Positive for dysphoric mood and suicidal ideas.     Physical Exam Updated Vital Signs BP 119/77 (BP Location: Left Arm)   Pulse (!) 109   Temp 98.5 F (36.9 C) (Oral)   Resp 18   Ht 5\' 4"  (1.626 m)   Wt 81.6 kg   LMP 06/26/2018   SpO2 100%   BMI 30.90 kg/m   Physical Exam  Constitutional: She is oriented to person, place, and time. She appears well-developed and well-nourished. No distress.  HENT:  Head: Normocephalic and atraumatic.  Mouth/Throat: Oropharynx is clear and moist.  Eyes: Right eye exhibits no discharge. Left eye exhibits no discharge.  Neck: Neck supple.  Cardiovascular: Normal rate, regular rhythm, normal heart sounds and intact distal pulses.  Pulmonary/Chest: Effort normal and breath sounds normal. No respiratory distress.  Respirations equal and unlabored, patient able to speak in full sentences, lungs  clear to auscultation bilaterally  Abdominal: Soft. Bowel sounds are normal. She exhibits no distension and no mass. There is no tenderness. There is no guarding.  Abdomen soft, nondistended, nontender to palpation in all quadrants without guarding or peritoneal signs  Musculoskeletal: She exhibits no deformity.  Neurological: She is alert and oriented to person, place, and time. Coordination normal.  Speech is clear, able to follow commands CN III-XII intact Normal strength in upper and lower  extremities bilaterally including dorsiflexion and plantar flexion, strong and equal grip strength Sensation normal to light and sharp touch Moves extremities without ataxia, coordination intact  Skin: Skin is warm and dry. She is not diaphoretic.  Psychiatric: Her speech is normal. Judgment normal. Her mood appears anxious. She is slowed. She is not actively hallucinating. Cognition and memory are normal. She exhibits a depressed mood. She expresses suicidal ideation. She expresses no homicidal ideation. She expresses no suicidal plans.  Nursing note and vitals reviewed.    ED Treatments / Results  Labs (all labs ordered are listed, but only abnormal results are displayed) Labs Reviewed  COMPREHENSIVE METABOLIC PANEL - Abnormal; Notable for the following components:      Result Value   Glucose, Bld 115 (*)    All other components within normal limits  CBC WITH DIFFERENTIAL/PLATELET - Abnormal; Notable for the following components:   Neutro Abs 8.2 (*)    All other components within normal limits  ACETAMINOPHEN LEVEL - Abnormal; Notable for the following components:   Acetaminophen (Tylenol), Serum <10 (*)    All other components within normal limits  ETHANOL  SALICYLATE LEVEL  RAPID URINE DRUG SCREEN, HOSP PERFORMED  I-STAT BETA HCG BLOOD, ED ([facility], [facility], [facility] ONLY)    EKG None  Radiology No results found.  Procedures Procedures (including critical care time)  Medications Ordered in ED Medications - No data to display   Initial Impression / Assessment and Plan / ED Course  I have reviewed the triage vital signs and the nursing notes.  Pertinent labs & imaging results that were available during my care of the patient were reviewed by me and considered in my medical decision making (see chart for details).  Patient presents for behavioral health for medical clearance.  Reports worsening suicidal ideations without specific plan over the past 4 days attributed to being off of  her medications for the past 3 weeks and increasing stress at home.  Mild tachycardia on arrival which has resolved on my exam, no focal medical complaints or recent illnesses.  Patient appears well and in no acute distress.  No focal findings on exam.  Will get medical screening labs and EKG.  She and husband is at bedside with her and expresses frustration regarding wait time to be seen and evaluated after being sent over from behavioral health.  He reports they were under the impression they would be sent over here in room for evaluation and then sent back to behavioral health.  Husband is threatening to leave but after talking with the patient she agrees to stay for lab evaluation and medical clearance exam.  Medical clearance labs overall reassuring no leukocytosis, normal hemoglobin, no acute electrolyte derangements aside from slightly elevated glucose at 115, normal renal and liver function.  Acetaminophen, salicylate and ethanol levels are negative.  Negative pregnancy.  UDS pending.  Patient is medically cleared for continued psychiatric evaluation.  1:15 AM Went into talk with and reevaluate the patient, and she and her spouse were not present in  the room.  Alerted nursing staff who were unable to locate the patient.  Appears to have eloped from the emergency department, patient was here on a voluntary basis and not under IVC.  Charge nurse called and spoke with patient's husband reported her being in the emergency department "did more harm than good and they refused to return for any further eval".  Husband reported patient was sleeping, and charger and was not able to speak with patient directly.  Charge nurse spoke with [facility] who reports they will reach out to the patient with outpatient resources at this time and did not feel that police needed to be sent out to bring the patient back to the emergency department.  Final Clinical Impressions(s) / ED Diagnoses   Final diagnoses:    Suicidal ideation  Depression, unspecified depression type    ED Discharge Orders    None       Dartha Lodge, [place] 07/01/18 7829    Geoffery Lyons, MD 07/01/18 [phone]

## 2020-06-25 ENCOUNTER — Ambulatory Visit (INDEPENDENT_AMBULATORY_CARE_PROVIDER_SITE_OTHER): Payer: BLUE CROSS/BLUE SHIELD | Admitting: Internal Medicine

## 2020-06-25 ENCOUNTER — Encounter: Payer: Self-pay | Admitting: Internal Medicine

## 2020-06-25 ENCOUNTER — Other Ambulatory Visit: Payer: Self-pay

## 2020-06-25 VITALS — BP 102/70 | HR 100 | Resp 16 | Ht 64.0 in | Wt 160.0 lb

## 2020-06-25 DIAGNOSIS — Z0000 Encounter for general adult medical examination without abnormal findings: Secondary | ICD-10-CM

## 2020-06-25 DIAGNOSIS — F3161 Bipolar disorder, current episode mixed, mild: Secondary | ICD-10-CM

## 2020-06-25 DIAGNOSIS — F332 Major depressive disorder, recurrent severe without psychotic features: Secondary | ICD-10-CM | POA: Diagnosis not present

## 2020-06-25 DIAGNOSIS — G90A Postural orthostatic tachycardia syndrome (POTS): Secondary | ICD-10-CM

## 2020-06-25 DIAGNOSIS — I498 Other specified cardiac arrhythmias: Secondary | ICD-10-CM | POA: Diagnosis not present

## 2020-06-25 MED ORDER — FLUDROCORTISONE ACETATE 0.1 MG PO TABS: 0.1000 mg | ORAL_TABLET | Freq: Every day | ORAL | 3 refills | Status: AC

## 2020-06-25 NOTE — Patient Instructions (Signed)
Please follow up with Ob./Gyn. for PAP smear. Follow up with Psychiatrist regularly. Continue medications as advised.

## 2020-06-25 NOTE — Progress Notes (Signed)
New Patient Office Visit  Subjective:  Patient ID: Victoria [name], female    DOB: 1996/09/18  Age: 24 y.o. MRN: [id number]  CC:  Chief Complaint  Patient presents with  . New Patient (Initial Visit)    establish care, needs refill on bp med today    HPI Victoria [name] presents for establishing primary care. Patient takes Florinef for POTS, but ran out of it for last 1 month. Since then, patient feels mild dizziness at times, but denies any episodes of falls. Patient also takes Zoloft and Lamictal for bipolar disorder and follows up with Psychiatry. Patient denies any other active complaints.  Patient has not had PAP test for many years. Ob./Gyn. referral provided.  Past Medical History:  Diagnosis Date  . Bipolar disorder (HCC)   . POTS (postural orthostatic tachycardia syndrome)     Past Surgical History:  Procedure Laterality Date  . EXTRACORPOREAL CIRCULATION      History reviewed. No pertinent family history.  Social History   Socioeconomic History  . Marital status: Married    Spouse name: Not on file  . Number of children: Not on file  . Years of education: Not on file  . Highest education level: Not on file  Occupational History  . Not on file  Tobacco Use  . Smoking status: Never Smoker  . Smokeless tobacco: Never Used  Substance and Sexual Activity  . Alcohol use: Yes  . Drug use: No  . Sexual activity: Yes    Birth control/protection: Inserts  Other Topics Concern  . Not on file  Social History Narrative  . Not on file   Social Determinants of Health   Financial Resource Strain:   . Difficulty of Paying Living Expenses: Not on file  Food Insecurity:   . Worried About Programme researcher, broadcasting/film/video in the Last Year: Not on file  . Ran Out of Food in the Last Year: Not on file  Transportation Needs:   . Lack of Transportation (Medical): Not on file  . Lack of Transportation (Non-Medical): Not on file  Physical Activity:   . Days of Exercise per Week:  Not on file  . Minutes of Exercise per Session: Not on file  Stress:   . Feeling of Stress : Not on file  Social Connections:   . Frequency of Communication with Friends and Family: Not on file  . Frequency of Social Gatherings with Friends and Family: Not on file  . Attends Religious Services: Not on file  . Active Member of Clubs or Organizations: Not on file  . Attends Banker Meetings: Not on file  . Marital Status: Not on file  Intimate Partner Violence:   . Fear of Current or Ex-Partner: Not on file  . Emotionally Abused: Not on file  . Physically Abused: Not on file  . Sexually Abused: Not on file    ROS Review of Systems  Constitutional: Negative for chills and fever.  HENT: Negative for congestion and sore throat.   Eyes: Negative for pain and redness.  Respiratory: Negative for cough and shortness of breath.   Cardiovascular: Negative for chest pain and leg swelling.  Gastrointestinal: Negative for constipation, diarrhea, nausea and vomiting.  Endocrine: Negative for polydipsia and polyuria.  Genitourinary: Negative for dysuria and hematuria.  Musculoskeletal: Negative for back pain and neck pain.  Skin: Negative for rash.  Neurological: Positive for dizziness. Negative for weakness, numbness and headaches.  Hematological: Does not bruise/bleed easily.  Psychiatric/Behavioral:  Negative for agitation and behavioral problems.    Objective:   Today's Vitals: BP 102/70   Pulse 100   Resp 16   Ht 5\' 4"  (1.626 m)   Wt 160 lb 0.6 oz (72.6 kg)   SpO2 99%   BMI 27.47 kg/m   Physical Exam Vitals reviewed.  Constitutional:      General: She is not in acute distress.    Appearance: She is not diaphoretic.  HENT:     Head: Normocephalic and atraumatic.     Nose: Nose normal.     Mouth/Throat:     Mouth: Mucous membranes are moist.  Eyes:     General: No scleral icterus.    Extraocular Movements: Extraocular movements intact.     Pupils: Pupils are  equal, round, and reactive to light.  Cardiovascular:     Rate and Rhythm: Normal rate and regular rhythm.     Pulses: Normal pulses.     Heart sounds: No murmur heard.   Pulmonary:     Breath sounds: Normal breath sounds. No wheezing or rales.  Abdominal:     Palpations: Abdomen is soft.     Tenderness: There is no abdominal tenderness.  Musculoskeletal:     Cervical back: Neck supple. No tenderness.     Right lower leg: No edema.     Left lower leg: No edema.  Skin:    General: Skin is warm.     Findings: No rash.  Neurological:     General: No focal deficit present.     Mental Status: She is alert and oriented to person, place, and time.     Sensory: No sensory deficit.     Motor: No weakness.  Psychiatric:        Mood and Affect: Mood normal.        Behavior: Behavior normal.     Assessment & Plan:   Problem List Items Addressed This Visit      Cardiovascular and Mediastinum   Postural orthostatic tachycardia syndrome    Takes Fludrocortisone, unaware of dose Has not been taking for last 1 month C/o mild dizziness at times Started Fludrocortisone 0.1 mg QD Advised to avoid sudden changes in posture        Other   Major depressive disorder, recurrent, severe without psychotic features (HCC)    Follows up with Psychiatry Well-controlled on Zoloft and Lamictal      Relevant Medications   sertraline (ZOLOFT) 100 MG tablet    Other Visit Diagnoses    POTS (postural orthostatic tachycardia syndrome)    -  Primary   Relevant Medications   fludrocortisone (FLORINEF) 0.1 MG tablet   Other Relevant Orders   CBC with Differential   Comprehensive Metabolic Panel (CMET)   Magnesium   T4 AND TSH   Bipolar disorder, current episode mixed, mild (HCC)       Annual physical exam       Relevant Orders   HIV antibody (with reflex)   Ambulatory referral to Obstetrics / Gynecology      Outpatient Encounter Medications as of 06/25/2020  Medication Sig  . lamoTRIgine  (LAMICTAL) 200 MG tablet Take 200 mg by mouth daily.  . sertraline (ZOLOFT) 100 MG tablet Take 200 mg by mouth daily.  . [DISCONTINUED] FLUDROCORTISONE ACETATE PO Take 0.2 mg by mouth daily.  . fludrocortisone (FLORINEF) 0.1 MG tablet Take 1 tablet (0.1 mg total) by mouth daily.  . [DISCONTINUED] ondansetron (ZOFRAN) 4 MG tablet Take 1 tablet (  4 mg total) by mouth every 6 (six) hours. (Patient not taking: Reported on 06/25/2020)  . [DISCONTINUED] traMADol (ULTRAM) 50 MG tablet Take 1 tablet (50 mg total) by mouth every 6 (six) hours as needed. (Patient not taking: Reported on 06/25/2020)   No facility-administered encounter medications on file as of 06/25/2020.    Follow-up: Return in about 1 month (around 07/25/2020).   Anabel Halon, MD

## 2020-06-25 NOTE — Assessment & Plan Note (Signed)
Takes Fludrocortisone, unaware of dose Has not been taking for last 1 month C/o mild dizziness at times Started Fludrocortisone 0.1 mg QD Advised to avoid sudden changes in posture

## 2020-06-25 NOTE — Assessment & Plan Note (Signed)
Follows up with Psychiatry Well-controlled on Zoloft and Lamictal

## 2020-07-28 ENCOUNTER — Ambulatory Visit: Payer: BLUE CROSS/BLUE SHIELD | Admitting: Internal Medicine

## 2020-07-30 ENCOUNTER — Encounter: Payer: BLUE CROSS/BLUE SHIELD | Admitting: Adult Health

## 2020-08-06 ENCOUNTER — Ambulatory Visit: Payer: BLUE CROSS/BLUE SHIELD | Admitting: Internal Medicine

## 2020-08-28 ENCOUNTER — Ambulatory Visit (INDEPENDENT_AMBULATORY_CARE_PROVIDER_SITE_OTHER): Payer: BLUE CROSS/BLUE SHIELD | Admitting: Adult Health

## 2020-08-28 ENCOUNTER — Other Ambulatory Visit (HOSPITAL_COMMUNITY)
Admission: RE | Admit: 2020-08-28 | Discharge: 2020-08-28 | Disposition: A | Discharge status: Home / Self Care | Payer: BLUE CROSS/BLUE SHIELD | Source: Ambulatory Visit | Attending: Adult Health | Admitting: Adult Health

## 2020-08-28 ENCOUNTER — Other Ambulatory Visit: Payer: Self-pay

## 2020-08-28 ENCOUNTER — Encounter: Payer: Self-pay | Admitting: Adult Health

## 2020-08-28 VITALS — BP 107/74 | HR 94 | Ht 63.5 in | Wt 158.5 lb

## 2020-08-28 DIAGNOSIS — Z01419 Encounter for gynecological examination (general) (routine) without abnormal findings: Secondary | ICD-10-CM | POA: Insufficient documentation

## 2020-08-28 DIAGNOSIS — Z975 Presence of (intrauterine) contraceptive device: Secondary | ICD-10-CM

## 2020-08-28 NOTE — Progress Notes (Signed)
°  Subjective:     Patient ID: UNNAMED HINO, female   DOB: 1996-01-02, 24 y.o.   MRN: [id number]  HPI Victoria [name] is a 24 year old white female, separated, G0P0, in for pap smear. She has a nexplanon that is due to be removed and she wants another. PCP is Dr Allena Katz.  Review of Systems Patient denies any headaches, hearing loss, fatigue, blurred vision, shortness of breath, chest pain, abdominal pain, problems with bowel movements, urination, or intercourse(not active at present). No joint pain or mood swings. Reviewed past medical,surgical, social and family history. Reviewed medications and allergies.     Objective:   Physical Exam BP 107/74 (BP Location: Left Arm, Patient Position: Sitting, Cuff Size: Normal)    Pulse 94    Ht 5' 3.5" (1.613 m)    Wt 158 lb 8 oz (71.9 kg)    LMP 07/26/2020 (Approximate)    BMI 27.64 kg/m  Skin warm and dry.Pelvic: external genitalia is normal in appearance no lesions, vagina: pink  With good moisture,,urethra has no lesions or masses noted, cervix:smooth,pap with GC/CHL and high risk HPV genotyping performed, uterus: normal size, shape and contour, non tender, no masses felt, adnexa: no masses or tenderness noted. Bladder is non tender and no masses felt.  AA is 2 Fall risk is low  PHQ 9 score is 2, she is on zoloft, no SI   Upstream - 08/28/20 1424      Pregnancy Intention Screening   Does the patient want to become pregnant in the next year? No    Does the patient's partner want to become pregnant in the next year? No    Would the patient like to discuss contraceptive options today? No      Contraception Wrap Up   Current Method Hormonal Implant    End Method Abstinence;Hormonal Implant    Contraception Counseling Provided No         Examination chaperoned by Malachy Mood LPN     Assessment:     1. Encounter for gynecological examination with Papanicolaou smear of cervix Physical with PCP Pap sent Pap in 3 years if normal   2. Nexplanon in  place Return 12/3 for nexplanon removal and reinsertion     Plan:     Pap in 3 years if normal  Return 12/3 for nexplanon removal and reinsertion

## 2020-08-29 LAB — CBC WITH DIFFERENTIAL/PLATELET
Basos: 1 %
Hematocrit: 43 % (ref 34.0–46.6)
Hemoglobin: 14.4 g/dL (ref 11.1–15.9)
MCHC: 33.5 g/dL (ref 31.5–35.7)
MCV: 90 fL (ref 79–97)
Neutrophils: 67 %
RBC: 4.76 x10E6/uL (ref 3.77–5.28)
RDW: 12.5 % (ref 11.7–15.4)

## 2020-08-29 LAB — COMPREHENSIVE METABOLIC PANEL
AST: 15 IU/L (ref 0–40)
BUN/Creatinine Ratio: 6 — ABNORMAL LOW (ref 9–23)
BUN: 5 mg/dL — ABNORMAL LOW (ref 6–20)
Globulin, Total: 2.6 g/dL (ref 1.5–4.5)
Total Protein: 7.3 g/dL (ref 6.0–8.5)

## 2020-08-29 LAB — MAGNESIUM

## 2020-08-29 LAB — HIV ANTIBODY (ROUTINE TESTING W REFLEX): HIV Screen 4th Generation wRfx: NONREACTIVE

## 2020-08-29 LAB — T4 AND TSH: TSH: 0.949 u[IU]/mL (ref 0.450–4.500)

## 2020-08-30 LAB — CBC WITH DIFFERENTIAL/PLATELET
Basophils Absolute: 0 10*3/uL (ref 0.0–0.2)
EOS (ABSOLUTE): 0 10*3/uL (ref 0.0–0.4)
Eos: 0 %
Immature Grans (Abs): 0 10*3/uL (ref 0.0–0.1)
Immature Granulocytes: 0 %
Lymphocytes Absolute: 1.6 10*3/uL (ref 0.7–3.1)
Lymphs: 26 %
MCH: 30.3 pg (ref 26.6–33.0)
Monocytes Absolute: 0.4 10*3/uL (ref 0.1–0.9)
Monocytes: 6 %
Neutrophils Absolute: 4.3 10*3/uL (ref 1.4–7.0)
Platelets: 301 10*3/uL (ref 150–450)
WBC: 6.4 10*3/uL (ref 3.4–10.8)

## 2020-08-30 LAB — T4 AND TSH: T4, Total: 8 ug/dL (ref 4.5–12.0)

## 2020-08-30 LAB — COMPREHENSIVE METABOLIC PANEL
ALT: 8 IU/L (ref 0–32)
Albumin/Globulin Ratio: 1.8 (ref 1.2–2.2)
Albumin: 4.7 g/dL (ref 3.9–5.0)
Alkaline Phosphatase: 74 IU/L (ref 44–121)
Bilirubin Total: 0.4 mg/dL (ref 0.0–1.2)
CO2: 23 mmol/L (ref 20–29)
Calcium: 9.3 mg/dL (ref 8.7–10.2)
Chloride: 102 mmol/L (ref 96–106)
Creatinine, Ser: 0.84 mg/dL (ref 0.57–1.00)
GFR calc Af Amer: 111 mL/min/{1.73_m2} (ref >59)
GFR calc non Af Amer: 96 mL/min/{1.73_m2} (ref >59)
Glucose: 84 mg/dL (ref 65–99)
Potassium: 4.4 mmol/L (ref 3.5–5.2)
Sodium: 140 mmol/L (ref 134–144)

## 2020-09-01 ENCOUNTER — Other Ambulatory Visit: Payer: Self-pay

## 2020-09-01 ENCOUNTER — Ambulatory Visit (INDEPENDENT_AMBULATORY_CARE_PROVIDER_SITE_OTHER): Payer: BLUE CROSS/BLUE SHIELD | Admitting: Internal Medicine

## 2020-09-01 ENCOUNTER — Encounter: Payer: Self-pay | Admitting: Internal Medicine

## 2020-09-01 VITALS — BP 110/69 | HR 74 | Temp 97.6°F | Ht 63.0 in | Wt 159.0 lb

## 2020-09-01 DIAGNOSIS — F332 Major depressive disorder, recurrent severe without psychotic features: Secondary | ICD-10-CM

## 2020-09-01 DIAGNOSIS — G90A Postural orthostatic tachycardia syndrome (POTS): Secondary | ICD-10-CM

## 2020-09-01 DIAGNOSIS — I498 Other specified cardiac arrhythmias: Secondary | ICD-10-CM

## 2020-09-01 NOTE — Assessment & Plan Note (Signed)
Well-controlled on Zoloft and Lamictal Referral for Psychiatry provided as per patient request for local care.

## 2020-09-01 NOTE — Patient Instructions (Signed)
Please continue to take medications as prescribed.  Please contact us if you have persistent dizziness/lightheadedness/palpitations.  You are being referred to Psychiatry.

## 2020-09-01 NOTE — Assessment & Plan Note (Signed)
Takes Fludrocortisone 0.1 mg QD C/o mild dizziness at times, better now Advised to avoid sudden changes in posture Will check cortisol level in the next visit CMP wnl

## 2020-09-01 NOTE — Progress Notes (Signed)
Established Patient Office Visit  Subjective:  Patient ID: Victoria [name], female    DOB: 1996/09/27  Age: 24 y.o. MRN: [id number]  CC:  Chief Complaint  Patient presents with  . Follow-up    HPI MINNAH [name] is a 24 year old female with past medical history of POTS and bipolar disorder presents for follow-up of her POTS.  She states that she has been doing well overall.  Patient has started taking Florinef for her POTS since the last visit, and states that her dizziness has improved significantly now.  She denies any episodes of LOC or syncope.  She is also trying to avoid any sudden positional changes.  She takes Lamictal and Zoloft for her depression/bipolar disorder.  She used to follow-up with her psychiatrist through televisit, but wants a referral to a local psychiatrist.  Past Medical History:  Diagnosis Date  . Bipolar disorder (HCC)   . POTS (postural orthostatic tachycardia syndrome)     Past Surgical History:  Procedure Laterality Date  . EXTRACORPOREAL CIRCULATION      Family History  Problem Relation Age of Onset  . Diabetes Paternal Grandfather   . Colon cancer Maternal Grandfather     Social History   Socioeconomic History  . Marital status: Legally Separated    Spouse name: Not on file  . Number of children: Not on file  . Years of education: Not on file  . Highest education level: Not on file  Occupational History  . Not on file  Tobacco Use  . Smoking status: Never Smoker  . Smokeless tobacco: Never Used  Vaping Use  . Vaping Use: Former  Substance and Sexual Activity  . Alcohol use: Yes    Comment: occ  . Drug use: No  . Sexual activity: Not Currently    Birth control/protection: Implant  Other Topics Concern  . Not on file  Social History Narrative  . Not on file   Social Determinants of Health   Financial Resource Strain: Low Risk   . Difficulty of Paying Living Expenses: Not very hard  Food Insecurity: No Food Insecurity   . Worried About Programme researcher, broadcasting/film/video in the Last Year: Never true  . Ran Out of Food in the Last Year: Never true  Transportation Needs: No Transportation Needs  . Lack of Transportation (Medical): No  . Lack of Transportation (Non-Medical): No  Physical Activity: Insufficiently Active  . Days of Exercise per Week: 3 days  . Minutes of Exercise per Session: 30 min  Stress: No Stress Concern Present  . Feeling of Stress : Only a little  Social Connections: Socially Isolated  . Frequency of Communication with Friends and Family: Three times a week  . Frequency of Social Gatherings with Friends and Family: Twice a week  . Attends Religious Services: Never  . Active Member of Clubs or Organizations: No  . Attends Banker Meetings: Never  . Marital Status: Separated  Intimate Partner Violence: At Risk  . Fear of Current or Ex-Partner: Yes  . Emotionally Abused: No  . Physically Abused: No  . Sexually Abused: No    Outpatient Medications Prior to Visit  Medication Sig Dispense Refill  . etonogestrel (NEXPLANON) 68 MG IMPL implant 1 each by Subdermal route once.    . fludrocortisone (FLORINEF) 0.1 MG tablet Take 1 tablet (0.1 mg total) by mouth daily. 30 tablet 3  . lamoTRIgine (LAMICTAL) 200 MG tablet Take 200 mg by mouth daily.    Marland Kitchen  sertraline (ZOLOFT) 100 MG tablet Take 200 mg by mouth daily.     No facility-administered medications prior to visit.    No Known Allergies  ROS Review of Systems  Constitutional: Negative for chills and fever.  HENT: Negative for congestion and sore throat.   Eyes: Negative for pain and redness.  Respiratory: Negative for cough and shortness of breath.   Cardiovascular: Negative for chest pain and leg swelling.  Gastrointestinal: Negative for constipation, diarrhea, nausea and vomiting.  Endocrine: Negative for polydipsia and polyuria.  Genitourinary: Negative for dysuria and hematuria.  Musculoskeletal: Negative for back pain and  neck pain.  Skin: Negative for rash.  Neurological: Positive for dizziness. Negative for weakness, numbness and headaches.  Hematological: Does not bruise/bleed easily.  Psychiatric/Behavioral: Negative for agitation and behavioral problems.      Objective:    Physical Exam Vitals reviewed.  Constitutional:      General: She is not in acute distress.    Appearance: She is not diaphoretic.  HENT:     Head: Normocephalic and atraumatic.     Nose: Nose normal. No congestion.     Mouth/Throat:     Mouth: Mucous membranes are moist.     Pharynx: No posterior oropharyngeal erythema.  Eyes:     General: No scleral icterus.    Extraocular Movements: Extraocular movements intact.     Pupils: Pupils are equal, round, and reactive to light.  Cardiovascular:     Rate and Rhythm: Normal rate and regular rhythm.     Pulses: Normal pulses.     Heart sounds: Normal heart sounds. No murmur heard.   Pulmonary:     Breath sounds: Normal breath sounds. No wheezing or rales.  Abdominal:     Palpations: Abdomen is soft.     Tenderness: There is no abdominal tenderness.  Musculoskeletal:     Cervical back: Neck supple. No tenderness.     Right lower leg: No edema.     Left lower leg: No edema.  Skin:    General: Skin is warm.     Findings: No rash.  Neurological:     General: No focal deficit present.     Mental Status: She is alert and oriented to person, place, and time.     Sensory: No sensory deficit.     Motor: No weakness.  Psychiatric:        Mood and Affect: Mood normal.        Behavior: Behavior normal.     BP 110/69 (BP Location: Left Arm, Patient Position: Sitting, Cuff Size: Normal)   Pulse 74   Temp 97.6 F (36.4 C) (Temporal)   Ht 5\' 3"  (1.6 m)   Wt 159 lb (72.1 kg)   LMP 08/30/2020   SpO2 98%   BMI 28.17 kg/m  Wt Readings from Last 3 Encounters:  09/01/20 159 lb (72.1 kg)  08/28/20 158 lb 8 oz (71.9 kg)  06/25/20 160 lb 0.6 oz (72.6 kg)     Health  Maintenance Due  Topic Date Due  . Hepatitis C Screening  Never done  . PAP-Cervical Cytology Screening  Never done  . PAP SMEAR-Modifier  Never done    There are no preventive care reminders to display for this patient.  Lab Results  Component Value Date   TSH 0.949 08/28/2020   Lab Results  Component Value Date   WBC 6.4 08/28/2020   HGB 14.4 08/28/2020   HCT 43.0 08/28/2020   MCV 90 08/28/2020  PLT 301 08/28/2020   Lab Results  Component Value Date   NA 140 08/28/2020   K 4.4 08/28/2020   CO2 23 08/28/2020   GLUCOSE 84 08/28/2020   BUN 5 (L) 08/28/2020   CREATININE 0.84 08/28/2020   BILITOT 0.4 08/28/2020   ALKPHOS 74 08/28/2020   AST 15 08/28/2020   ALT 8 08/28/2020   PROT 7.3 08/28/2020   ALBUMIN 4.7 08/28/2020   CALCIUM 9.3 08/28/2020   ANIONGAP 10 07/01/2018   No results found for: CHOL No results found for: HDL No results found for: LDLCALC No results found for: TRIG No results found for: CHOLHDL No results found for: UJWJ1B    Assessment & Plan:   Problem List Items Addressed This Visit      Cardiovascular and Mediastinum   Postural orthostatic tachycardia syndrome - Primary    Takes Fludrocortisone 0.1 mg QD C/o mild dizziness at times, better now Advised to avoid sudden changes in posture Will check cortisol level in the next visit CMP wnl        Other   Major depressive disorder, recurrent, severe without psychotic features (HCC)    Well-controlled on Zoloft and Lamictal Referral for Psychiatry provided as per patient request for local care.      Relevant Orders   Ambulatory referral to Psychiatry      No orders of the defined types were placed in this encounter.   Follow-up: Return in about 6 months (around 03/01/2021) for Annual physical.    Anabel Halon, MD

## 2020-09-05 LAB — CYTOLOGY - PAP
Chlamydia: NEGATIVE
Comment: NEGATIVE
Comment: NEGATIVE
Comment: NEGATIVE
Comment: NORMAL
HPV 16: NEGATIVE
HPV 18 / 45: NEGATIVE
High risk HPV: POSITIVE — AB
Neisseria Gonorrhea: NEGATIVE

## 2020-09-08 ENCOUNTER — Encounter: Payer: Self-pay | Admitting: Adult Health

## 2020-09-08 ENCOUNTER — Telehealth: Payer: Self-pay | Admitting: Adult Health

## 2020-09-08 DIAGNOSIS — R87613 High grade squamous intraepithelial lesion on cytologic smear of cervix (HGSIL): Secondary | ICD-10-CM | POA: Insufficient documentation

## 2020-09-08 DIAGNOSIS — R87619 Unspecified abnormal cytological findings in specimens from cervix uteri: Secondary | ICD-10-CM | POA: Insufficient documentation

## 2020-09-08 DIAGNOSIS — R87618 Other abnormal cytological findings on specimens from cervix uteri: Secondary | ICD-10-CM

## 2020-09-08 HISTORY — DX: Other abnormal cytological findings on specimens from cervix uteri: R87.618

## 2020-09-08 NOTE — Telephone Encounter (Signed)
Left message to call or look at [facility]

## 2020-09-08 NOTE — Telephone Encounter (Signed)
Victoria [name] Called back and is aware of pap results, will make colpo appt.

## 2020-09-12 ENCOUNTER — Other Ambulatory Visit: Payer: Self-pay

## 2020-09-12 ENCOUNTER — Encounter: Payer: Self-pay | Admitting: Adult Health

## 2020-09-12 ENCOUNTER — Ambulatory Visit (INDEPENDENT_AMBULATORY_CARE_PROVIDER_SITE_OTHER): Payer: BLUE CROSS/BLUE SHIELD | Admitting: Adult Health

## 2020-09-12 VITALS — BP 107/71 | HR 93 | Ht 64.0 in | Wt 158.0 lb

## 2020-09-12 DIAGNOSIS — Z30017 Encounter for initial prescription of implantable subdermal contraceptive: Secondary | ICD-10-CM

## 2020-09-12 DIAGNOSIS — Z3202 Encounter for pregnancy test, result negative: Secondary | ICD-10-CM

## 2020-09-12 DIAGNOSIS — Z3046 Encounter for surveillance of implantable subdermal contraceptive: Secondary | ICD-10-CM

## 2020-09-12 LAB — POCT URINE PREGNANCY: Preg Test, Ur: NEGATIVE

## 2020-09-12 MED ORDER — ETONOGESTREL 68 MG ~~LOC~~ IMPL
68.0000 mg | DRUG_IMPLANT | Freq: Once | SUBCUTANEOUS | Status: AC
  Administered 2020-09-12: 68 mg via SUBCUTANEOUS

## 2020-09-12 NOTE — Patient Instructions (Signed)
Use condoms x 2 weeks, keep clean and dry x 24 hours, no heavy lifting, keep steri strips on x 72 hours, Keep pressure dressing on x 24 hours. Follow up prn problems.  

## 2020-09-12 NOTE — Progress Notes (Signed)
  Subjective:     Patient ID: Victoria [name], female   DOB: 03/24/1996, 24 y.o.   MRN: [id number]  HPI Victoria [name] is a 24 year old white female,single G0P0 in for nexplanon removal and reinsertion. PCP is Dr Allena Katz.  Review of Systems For nexplanon removal and reinsertion Reviewed past medical,surgical, social and family history. Reviewed medications and allergies.     Objective:   Physical Exam BP 107/71 (BP Location: Right Arm, Patient Position: Sitting, Cuff Size: Normal)   Pulse 93   Ht 5\' 4"  (1.626 m)   Wt 158 lb (71.7 kg)   LMP 08/30/2020   BMI 27.12 kg/m UPT is negative Consent signed time out called. Left arm cleansed with betadine, and injected with 1.5 cc 2% lidocaine and waited til numb.Under sterile technique a #11 blade was used to make small vertical incision, and a curved forceps was used to easily remove rod, and rod easily reinserted.palpated by provider and pt. Steri strips applied. Pressure dressing applied.     Upstream - 09/12/20 1246      Pregnancy Intention Screening   Does the patient want to become pregnant in the next year? No    Does the patient's partner want to become pregnant in the next year? No    Would the patient like to discuss contraceptive options today? Yes      Contraception Wrap Up   Current Method Hormonal Implant    End Method Hormonal Implant    Contraception Counseling Provided Yes          Assessment:     1. Nexplanon insertion Lot 14/03/21 Exp [id number]  2. Encounter for removal and reinsertion of Nexplanon    Plan:     Use condoms x 2 weeks, keep clean and dry x 24 hours, no heavy lifting, keep steri strips on x 72 hours, Keep pressure dressing on x 24 hours. Follow up prn problems. Has appt 12/6 for colpo

## 2020-09-15 ENCOUNTER — Ambulatory Visit (INDEPENDENT_AMBULATORY_CARE_PROVIDER_SITE_OTHER): Payer: BLUE CROSS/BLUE SHIELD | Admitting: Obstetrics & Gynecology

## 2020-09-15 ENCOUNTER — Encounter: Payer: Self-pay | Admitting: Obstetrics & Gynecology

## 2020-09-15 ENCOUNTER — Other Ambulatory Visit: Payer: Self-pay

## 2020-09-15 VITALS — BP 113/70 | HR 100 | Ht 64.0 in | Wt 161.5 lb

## 2020-09-15 DIAGNOSIS — N870 Mild cervical dysplasia: Secondary | ICD-10-CM | POA: Diagnosis not present

## 2020-09-15 DIAGNOSIS — Z3202 Encounter for pregnancy test, result negative: Secondary | ICD-10-CM | POA: Diagnosis not present

## 2020-09-15 LAB — POCT URINE PREGNANCY: Preg Test, Ur: NEGATIVE

## 2020-09-15 NOTE — Addendum Note (Signed)
Addended by: Colen Darling on: 09/15/2020 03:34 PM   Modules accepted: Orders

## 2020-09-15 NOTE — Progress Notes (Signed)
Colposcopy Procedure Note:  Colposcopy Procedure Note  Indications:  2021  LSIL/+HPV/negative 16/18 Previous Pap by history normal about 3 years ago   2019 ASCCP recommendation: 4.3% IR CIN3  Smoker:  No. New sexual partner:  No.    History of abnormal Pap: no  Procedure Details  The risks and benefits of the procedure and Written informed consent obtained.  Speculum placed in vagina and excellent visualization of cervix achieved, cervix swabbed x 3 with acetic acid solution.  Findings: Cervix: visible lesion(s) at 12 o'clock, acetowhite lesion(s) noted at 12 o'clock, punctation noted at 12 o'clock and mosaicism noted at 12 o'clock;   SCJ visualized - lesion at 12 o'clock, cervical biopsies taken at 12 o'clock, specimen labelled and sent to pathology and hemostasis achieved with Monsel's solution. Vaginal inspection: normal without visible lesions. Vulvar colposcopy: vulvar colposcopy not performed.  Specimens: cervical biopsy  Impression:  Low grade vs high grade dysplasia, large extension lesion  Complications: none.  Plan(Based on 2019 ASCCP recommendations) Specimens labelled and sent to Pathology.

## 2020-09-23 ENCOUNTER — Other Ambulatory Visit: Payer: Self-pay

## 2020-09-23 ENCOUNTER — Encounter: Payer: Self-pay | Admitting: Obstetrics & Gynecology

## 2020-09-23 ENCOUNTER — Telehealth (INDEPENDENT_AMBULATORY_CARE_PROVIDER_SITE_OTHER): Payer: BLUE CROSS/BLUE SHIELD | Admitting: Obstetrics & Gynecology

## 2020-09-23 DIAGNOSIS — R87613 High grade squamous intraepithelial lesion on cytologic smear of cervix (HGSIL): Secondary | ICD-10-CM

## 2020-09-23 NOTE — Progress Notes (Signed)
Preoperative History and Physical  Victoria [name] is a 24 y.o. G0P0000 with Patient's last menstrual period was 08/30/2020. admitted for a laser ablation of the cervix.  colpo directed biopsies HSIL  PMH:    Past Medical History:  Diagnosis Date  . Abnormal Papanicolaou smear of cervix with positive human papilloma virus (HPV) test 09/08/2020   Pap was LSIL +HPV:  Needs colpo per ASCCP guidelines,  CIN 3+ immediate risk is 4.3%______________  . Bipolar disorder (HCC)   . POTS (postural orthostatic tachycardia syndrome)     PSH:     Past Surgical History:  Procedure Laterality Date  . EXTRACORPOREAL CIRCULATION      POb/GynH:      OB History    Gravida  0   Para  0   Term  0   Preterm  0   AB  0   Living  0     SAB  0   IAB  0   Ectopic  0   Multiple  0   Live Births  0           SH:   Social History   Tobacco Use  . Smoking status: Never Smoker  . Smokeless tobacco: Never Used  Vaping Use  . Vaping Use: Former  Substance Use Topics  . Alcohol use: Yes    Comment: occ  . Drug use: No    FH:    Family History  Problem Relation Age of Onset  . Diabetes Paternal Grandfather   . Colon cancer Maternal Grandfather      Allergies: No Known Allergies  Medications:       Current Outpatient Medications:  .  etonogestrel (NEXPLANON) 68 MG IMPL implant, 1 each by Subdermal route once., Disp: , Rfl:  .  fludrocortisone (FLORINEF) 0.1 MG tablet, Take 1 tablet (0.1 mg total) by mouth daily., Disp: 30 tablet, Rfl: 3 .  lamoTRIgine (LAMICTAL) 200 MG tablet, Take 200 mg by mouth daily., Disp: , Rfl:  .  sertraline (ZOLOFT) 100 MG tablet, Take 200 mg by mouth daily., Disp: , Rfl:   Review of Systems:   Review of Systems  Constitutional: Negative for fever, chills, weight loss, malaise/fatigue and diaphoresis.  HENT: Negative for hearing loss, ear pain, nosebleeds, congestion, sore throat, neck pain, tinnitus and ear discharge.   Eyes: Negative for  blurred vision, double vision, photophobia, pain, discharge and redness.  Respiratory: Negative for cough, hemoptysis, sputum production, shortness of breath, wheezing and stridor.   Cardiovascular: Negative for chest pain, palpitations, orthopnea, claudication, leg swelling and PND.  Gastrointestinal: Positive for abdominal pain. Negative for heartburn, nausea, vomiting, diarrhea, constipation, blood in stool and melena.  Genitourinary: Negative for dysuria, urgency, frequency, hematuria and flank pain.  Musculoskeletal: Negative for myalgias, back pain, joint pain and falls.  Skin: Negative for itching and rash.  Neurological: Negative for dizziness, tingling, tremors, sensory change, speech change, focal weakness, seizures, loss of consciousness, weakness and headaches.  Endo/Heme/Allergies: Negative for environmental allergies and polydipsia. Does not bruise/bleed easily.  Psychiatric/Behavioral: Negative for depression, suicidal ideas, hallucinations, memory loss and substance abuse. The patient is not nervous/anxious and does not have insomnia.      PHYSICAL EXAM:  Last menstrual period 08/30/2020.    Vitals reviewed. Constitutional: She is oriented to person, place, and time. She appears well-developed and well-nourished.  HENT:  Head: Normocephalic and atraumatic.  Right Ear: External ear normal.  Left Ear: External ear normal.  Nose: Nose normal.  Mouth/Throat: Oropharynx is clear and moist.  Eyes: Conjunctivae and EOM are normal. Pupils are equal, round, and reactive to light. Right eye exhibits no discharge. Left eye exhibits no discharge. No scleral icterus.  Neck: Normal range of motion. Neck supple. No tracheal deviation present. No thyromegaly present.  Cardiovascular: Normal rate, regular rhythm, normal heart sounds and intact distal pulses.  Exam reveals no gallop and no friction rub.   No murmur heard. Respiratory: Effort normal and breath sounds normal. No respiratory  distress. She has no wheezes. She has no rales. She exhibits no tenderness.  GI: Soft. Bowel sounds are normal. She exhibits no distension and no mass. There is tenderness. There is no rebound and no guarding.  Genitourinary:       Vulva is normal without lesions Vagina is pink moist without discharge Cervix normal in appearance  Uterus is normal size, contour, position, consistency, mobility, non-tender Adnexa is negative with normal sized ovaries by sonogram  Musculoskeletal: Normal range of motion. She exhibits no edema and no tenderness.  Neurological: She is alert and oriented to person, place, and time. She has normal reflexes. She displays normal reflexes. No cranial nerve deficit. She exhibits normal muscle tone. Coordination normal.  Skin: Skin is warm and dry. No rash noted. No erythema. No pallor.  Psychiatric: She has a normal mood and affect. Her behavior is normal. Judgment and thought content normal.    Labs: Results for orders placed or performed in visit on 09/15/20 (from the past 336 hour(s))  POCT urine pregnancy   Collection Time: 09/15/20  2:59 PM  Result Value Ref Range   Preg Test, Ur Negative Negative  Results for orders placed or performed in visit on 09/12/20 (from the past 336 hour(s))  POCT urine pregnancy   Collection Time: 09/12/20  1:27 PM  Result Value Ref Range   Preg Test, Ur Negative Negative    EKG: Orders placed or performed during the hospital encounter of 06/30/18  . ED EKG  . ED EKG    Imaging Studies: No results found.    Assessment: High grade cervical dysplsia  Plan: Laser ablation of the cervix, tentatively, 10/17/20  Amaryllis Dyke Parul Porcelli 09/23/2020 11:43 AM

## 2020-10-16 DIAGNOSIS — Z20822 Contact with and (suspected) exposure to covid-19: Secondary | ICD-10-CM | POA: Diagnosis not present

## 2020-10-20 ENCOUNTER — Telehealth: Payer: Self-pay

## 2020-10-20 NOTE — Telephone Encounter (Signed)
Pt needs a referral to psych. States she never received a call from past referral.

## 2020-10-21 ENCOUNTER — Other Ambulatory Visit: Payer: Self-pay | Admitting: Obstetrics & Gynecology

## 2020-10-21 NOTE — Patient Instructions (Signed)
Victoria [name]  10/21/2020     @PREFPERIOPPHARMACY @   Your procedure is scheduled on  10/24/2020.  Report to [facility] at  1120  A.M.  Call this number if you have problems the morning of surgery:  [phone]   Remember:  No solid foods after midnight 10/23/2020. You may have clear liquids until you drink your carb drink at 0900. Take your medications at 0900 and then nothing else to drink after that.                       Take these medicines the morning of surgery with A SIP OF WATER  Florinef, lamictal, zoloft.    Do not wear jewelry, make-up or nail polish.  Do not wear lotions, powders, or perfumes, or deodorant. Please brush your teeth.  Do not shave 48 hours prior to surgery.  Men may shave face and neck.  Do not bring valuables to the hospital.  [facility] is not responsible for any belongings or valuables.  Contacts, dentures or bridgework may not be worn into surgery.  Leave your suitcase in the car.  After surgery it may be brought to your room.  For patients admitted to the hospital, discharge time will be determined by your treatment team.  Patients discharged the day of surgery will not be allowed to drive home.   Name and phone number of your driver:   family Special instructions:  Bathe with 1/2 bottle of CHG the night before and the morning of your surgery. DO NOT use the CHG on your face, hair or genitals. Dry off with a clean towel, wear clean clothes after your bath and put clean sheets on your bed before you go to sleep the night before your surgery            DO NOT smoke the morning of your procedure.    Please read over the following fact sheets that you were given. Anesthesia Post-op Instructions and Care and Recovery After Surgery       Cervical Laser Surgery, Care After This sheet gives you information about how to care for yourself after your procedure. Your health care provider may also give you more specific instructions. If you  have problems or questions, contact your health care provider. What can I expect after the procedure? After the procedure, it is common to have:  Pain or discomfort.  Mild cramping.  Bleeding, spotting, or brownish discharge from your vagina. Follow these instructions at home: Activity  Rest as told by your health care provider.  Do not lift anything that is heavier than 10 lb (4.5 kg), or the limit that you are told, until your health care provider says that it is safe.  Do not have sex until your health care provider says it is okay.  Return to your normal activities as told by your health care provider. Ask your health care provider what activities are safe for you.   General instructions  Take over-the-counter and prescription medicines only as told by your health care provider.  Ask your health care provider if the medicine prescribed to you requires you to avoid driving or using heavy machinery.  Wear sanitary pads to absorb any bleeding, spotting, and discharge.  Do not douche or put anything into your vagina, including tampons, until your health care provider says it is okay.  It is up to you to get the results of your procedure. Ask  your health care provider, or the department that is doing the procedure, when your results will be ready.  Keep all follow-up visits as told by your health care provider. This is important. Contact a health care provider if:  Your pain or cramping does not improve.  Your periods are more painful than usual.  You do not get your period as expected. Get help right away if you have:  Any symptoms of infection, such as: ? A fever. ? Chills. ? Discharge that smells bad.  Severe pain in your abdomen.  Heavy bleeding from your vagina (more than a normal period).  Vaginal bleeding with clumps of blood (blood clots). Summary  After this procedure, it is common to have pain or discomfort and mild cramping. It is also common to have  bleeding, spotting, or brownish discharge from your vagina.  Do not have sex, douche, use tampons, or put anything in your vagina until your health care provider says it is okay.  Return to your normal activities as told by your health care provider. Ask your health care provider what activities are safe for you.  Take over-the-counter and prescription medicines only as told by your health care provider.  You may need to wear sanitary pads to absorb any bleeding, spotting, and discharge. This information is not intended to replace advice given to you by your health care provider. Make sure you discuss any questions you have with your health care provider. Document Revised: 03/12/2019 Document Reviewed: 03/12/2019 Elsevier Patient Education  2021 [employer] General Anesthesia, Adult, Care After This sheet gives you information about how to care for yourself after your procedure. Your health care provider may also give you more specific instructions. If you have problems or questions, contact your health care provider. What can I expect after the procedure? After the procedure, the following side effects are common:  Pain or discomfort at the IV site.  Nausea.  Vomiting.  Sore throat.  Trouble concentrating.  Feeling cold or chills.  Feeling weak or tired.  Sleepiness and fatigue.  Soreness and body aches. These side effects can affect parts of the body that were not involved in surgery. Follow these instructions at home: For the time period you were told by your health care provider:  Rest.  Do not participate in activities where you could fall or become injured.  Do not drive or use machinery.  Do not drink alcohol.  Do not take sleeping pills or medicines that cause drowsiness.  Do not make important decisions or sign legal documents.  Do not take care of children on your own.   Eating and drinking  Follow any instructions from your health care provider about  eating or drinking restrictions.  When you feel hungry, start by eating small amounts of foods that are soft and easy to digest (bland), such as toast. Gradually return to your regular diet.  Drink enough fluid to keep your urine pale yellow.  If you vomit, rehydrate by drinking water, juice, or clear broth. General instructions  If you have sleep apnea, surgery and certain medicines can increase your risk for breathing problems. Follow instructions from your health care provider about wearing your sleep device: ? Anytime you are sleeping, including during daytime naps. ? While taking prescription pain medicines, sleeping medicines, or medicines that make you drowsy.  Have a responsible adult stay with you for the time you are told. It is important to have someone help care for you until you are awake  and alert.  Return to your normal activities as told by your health care provider. Ask your health care provider what activities are safe for you.  Take over-the-counter and prescription medicines only as told by your health care provider.  If you smoke, do not smoke without supervision.  Keep all follow-up visits as told by your health care provider. This is important. Contact a health care provider if:  You have nausea or vomiting that does not get better with medicine.  You cannot eat or drink without vomiting.  You have pain that does not get better with medicine.  You are unable to pass urine.  You develop a skin rash.  You have a fever.  You have redness around your IV site that gets worse. Get help right away if:  You have difficulty breathing.  You have chest pain.  You have blood in your urine or stool, or you vomit blood. Summary  After the procedure, it is common to have a sore throat or nausea. It is also common to feel tired.  Have a responsible adult stay with you for the time you are told. It is important to have someone help care for you until you are  awake and alert.  When you feel hungry, start by eating small amounts of foods that are soft and easy to digest (bland), such as toast. Gradually return to your regular diet.  Drink enough fluid to keep your urine pale yellow.  Return to your normal activities as told by your health care provider. Ask your health care provider what activities are safe for you. This information is not intended to replace advice given to you by your health care provider. Make sure you discuss any questions you have with your health care provider. Document Revised: 06/12/2020 Document Reviewed: 01/10/2020 Elsevier Patient Education  2021 [employer].

## 2020-10-23 ENCOUNTER — Other Ambulatory Visit (HOSPITAL_COMMUNITY)
Admission: RE | Admit: 2020-10-23 | Discharge: 2020-10-23 | Disposition: A | Discharge status: Home / Self Care | Payer: Managed Care, Other (non HMO) | Source: Ambulatory Visit | Attending: Obstetrics & Gynecology | Admitting: Obstetrics & Gynecology

## 2020-10-23 ENCOUNTER — Other Ambulatory Visit: Payer: Self-pay

## 2020-10-23 ENCOUNTER — Encounter (HOSPITAL_COMMUNITY)
Admission: RE | Admit: 2020-10-23 | Discharge: 2020-10-23 | Disposition: A | Discharge status: Home / Self Care | Payer: Managed Care, Other (non HMO) | Source: Ambulatory Visit | Attending: Obstetrics & Gynecology | Admitting: Obstetrics & Gynecology

## 2020-10-23 ENCOUNTER — Encounter (HOSPITAL_COMMUNITY): Payer: Self-pay

## 2020-10-23 DIAGNOSIS — Z01818 Encounter for other preprocedural examination: Secondary | ICD-10-CM | POA: Insufficient documentation

## 2020-10-23 DIAGNOSIS — Z20822 Contact with and (suspected) exposure to covid-19: Secondary | ICD-10-CM | POA: Insufficient documentation

## 2020-10-23 LAB — CBC
HCT: 40.7 % (ref 36.0–46.0)
Hemoglobin: 13.6 g/dL (ref 12.0–15.0)
MCH: 31.1 pg (ref 26.0–34.0)
MCHC: 33.4 g/dL (ref 30.0–36.0)
MCV: 92.9 fL (ref 80.0–100.0)
Platelets: 340 10*3/uL (ref 150–400)
RBC: 4.38 MIL/uL (ref 3.87–5.11)
RDW: 12.3 % (ref 11.5–15.5)
WBC: 5.3 10*3/uL (ref 4.0–10.5)
nRBC: 0 % (ref 0.0–0.2)

## 2020-10-23 LAB — RAPID HIV SCREEN (HIV 1/2 AB+AG)
HIV 1/2 Antibodies: NONREACTIVE
HIV-1 P24 Antigen - HIV24: NONREACTIVE

## 2020-10-23 LAB — URINALYSIS, ROUTINE W REFLEX MICROSCOPIC
Bacteria, UA: NONE SEEN
Bilirubin Urine: NEGATIVE
Glucose, UA: NEGATIVE mg/dL
Ketones, ur: NEGATIVE mg/dL
Leukocytes,Ua: NEGATIVE
Nitrite: NEGATIVE
Protein, ur: NEGATIVE mg/dL
Specific Gravity, Urine: 1.019 (ref 1.005–1.030)
pH: 5 (ref 5.0–8.0)

## 2020-10-23 LAB — COMPREHENSIVE METABOLIC PANEL
ALT: 10 U/L (ref 0–44)
AST: 13 U/L — ABNORMAL LOW (ref 15–41)
Albumin: 4 g/dL (ref 3.5–5.0)
Alkaline Phosphatase: 58 U/L (ref 38–126)
Anion gap: 8 (ref 5–15)
BUN: 8 mg/dL (ref 6–20)
CO2: 25 mmol/L (ref 22–32)
Calcium: 9.1 mg/dL (ref 8.9–10.3)
Chloride: 105 mmol/L (ref 98–111)
Creatinine, Ser: 0.82 mg/dL (ref 0.44–1.00)
GFR, Estimated: >60 mL/min (ref >60)
Glucose, Bld: 99 mg/dL (ref 70–99)
Potassium: 3.5 mmol/L (ref 3.5–5.1)
Sodium: 138 mmol/L (ref 135–145)
Total Bilirubin: 0.5 mg/dL (ref 0.3–1.2)
Total Protein: 7 g/dL (ref 6.5–8.1)

## 2020-10-23 LAB — SARS CORONAVIRUS 2 (TAT 6-24 HRS): SARS Coronavirus 2: NEGATIVE

## 2020-10-23 LAB — HCG, QUANTITATIVE, PREGNANCY: hCG, Beta Chain, Quant, S: <1 m[IU]/mL (ref <5)

## 2020-10-24 ENCOUNTER — Encounter (HOSPITAL_COMMUNITY): Payer: Self-pay | Admitting: Obstetrics & Gynecology

## 2020-10-24 ENCOUNTER — Other Ambulatory Visit: Payer: Self-pay

## 2020-10-24 ENCOUNTER — Ambulatory Visit (HOSPITAL_COMMUNITY): Payer: 59 | Admitting: Anesthesiology

## 2020-10-24 ENCOUNTER — Ambulatory Visit (HOSPITAL_COMMUNITY)
Admission: RE | Admit: 2020-10-24 | Discharge: 2020-10-24 | Disposition: A | Discharge status: Home / Self Care | Payer: 59 | Attending: Obstetrics & Gynecology | Admitting: Obstetrics & Gynecology

## 2020-10-24 ENCOUNTER — Encounter (HOSPITAL_COMMUNITY)
Admission: RE | Disposition: A | Discharge status: Home / Self Care | Payer: Self-pay | Source: Home / Self Care | Attending: Obstetrics & Gynecology

## 2020-10-24 DIAGNOSIS — Z79899 Other long term (current) drug therapy: Secondary | ICD-10-CM | POA: Insufficient documentation

## 2020-10-24 DIAGNOSIS — R87613 High grade squamous intraepithelial lesion on cytologic smear of cervix (HGSIL): Secondary | ICD-10-CM | POA: Insufficient documentation

## 2020-10-24 DIAGNOSIS — Z833 Family history of diabetes mellitus: Secondary | ICD-10-CM | POA: Insufficient documentation

## 2020-10-24 DIAGNOSIS — Z800 Family history of malignant neoplasm of digestive organs: Secondary | ICD-10-CM | POA: Diagnosis not present

## 2020-10-24 DIAGNOSIS — R87619 Unspecified abnormal cytological findings in specimens from cervix uteri: Secondary | ICD-10-CM

## 2020-10-24 HISTORY — PX: LASER ABLATION CONDOLAMATA: SHX5941

## 2020-10-24 SURGERY — ABLATION, CONDYLOMA, USING LASER
Anesthesia: General

## 2020-10-24 MED ORDER — ORAL CARE MOUTH RINSE: 15.0000 mL | Freq: Once | OROMUCOSAL | Status: AC

## 2020-10-24 MED ORDER — MIDAZOLAM HCL 2 MG/2ML IJ SOLN
INTRAMUSCULAR | Status: AC
  Filled 2020-10-24: qty 2

## 2020-10-24 MED ORDER — FENTANYL CITRATE (PF) 100 MCG/2ML IJ SOLN
INTRAMUSCULAR | Status: DC | PRN
  Administered 2020-10-24: 25 ug via INTRAVENOUS
  Administered 2020-10-24: 50 ug via INTRAVENOUS
  Administered 2020-10-24: 25 ug via INTRAVENOUS

## 2020-10-24 MED ORDER — KETOROLAC TROMETHAMINE 30 MG/ML IJ SOLN
INTRAMUSCULAR | Status: AC
  Filled 2020-10-24: qty 1

## 2020-10-24 MED ORDER — KETOROLAC TROMETHAMINE 10 MG PO TABS: 10.0000 mg | ORAL_TABLET | Freq: Three times a day (TID) | ORAL | 0 refills | Status: DC | PRN

## 2020-10-24 MED ORDER — KETOROLAC TROMETHAMINE 30 MG/ML IJ SOLN
30.0000 mg | Freq: Once | INTRAMUSCULAR | Status: AC
  Administered 2020-10-24: 30 mg via INTRAVENOUS

## 2020-10-24 MED ORDER — FENTANYL CITRATE (PF) 100 MCG/2ML IJ SOLN
INTRAMUSCULAR | Status: AC
  Filled 2020-10-24: qty 2

## 2020-10-24 MED ORDER — MEPERIDINE HCL 50 MG/ML IJ SOLN
6.2500 mg | INTRAMUSCULAR | Status: DC | PRN
Start: 2020-10-24 — End: 2020-10-24

## 2020-10-24 MED ORDER — CHLORHEXIDINE GLUCONATE 0.12 % MT SOLN
OROMUCOSAL | Status: AC
  Filled 2020-10-24: qty 15

## 2020-10-24 MED ORDER — PROMETHAZINE HCL 25 MG/ML IJ SOLN
6.2500 mg | INTRAMUSCULAR | Status: DC | PRN
Start: 2020-10-24 — End: 2020-10-24

## 2020-10-24 MED ORDER — SILVER SULFADIAZINE 1 % EX CREA
TOPICAL_CREAM | CUTANEOUS | Status: AC
  Filled 2020-10-24: qty 50

## 2020-10-24 MED ORDER — HYDROMORPHONE HCL 1 MG/ML IJ SOLN: 0.2500 mg | INTRAMUSCULAR | Status: DC | PRN

## 2020-10-24 MED ORDER — LIDOCAINE HCL (CARDIAC) PF 100 MG/5ML IV SOSY
PREFILLED_SYRINGE | INTRAVENOUS | Status: DC | PRN
  Administered 2020-10-24: 80 mg via INTRATRACHEAL

## 2020-10-24 MED ORDER — CHLORHEXIDINE GLUCONATE 0.12 % MT SOLN
15.0000 mL | Freq: Once | OROMUCOSAL | Status: AC
  Administered 2020-10-24: 15 mL via OROMUCOSAL

## 2020-10-24 MED ORDER — LACTATED RINGERS IV SOLN: INTRAVENOUS | Status: DC | PRN

## 2020-10-24 MED ORDER — WATER FOR IRRIGATION, STERILE IR SOLN
Status: DC | PRN
  Administered 2020-10-24: 1000 mL

## 2020-10-24 MED ORDER — ONDANSETRON HCL 4 MG/2ML IJ SOLN
INTRAMUSCULAR | Status: DC | PRN
  Administered 2020-10-24: 4 mg via INTRAVENOUS

## 2020-10-24 MED ORDER — LIDOCAINE HCL (PF) 2 % IJ SOLN
INTRAMUSCULAR | Status: AC
  Filled 2020-10-24: qty 5

## 2020-10-24 MED ORDER — CEFAZOLIN SODIUM-DEXTROSE 2-4 GM/100ML-% IV SOLN
INTRAVENOUS | Status: AC
  Filled 2020-10-24: qty 100

## 2020-10-24 MED ORDER — CEFAZOLIN SODIUM-DEXTROSE 2-4 GM/100ML-% IV SOLN
2.0000 g | INTRAVENOUS | Status: AC
  Administered 2020-10-24: 2 g via INTRAVENOUS

## 2020-10-24 MED ORDER — MIDAZOLAM HCL 5 MG/5ML IJ SOLN
INTRAMUSCULAR | Status: DC | PRN
  Administered 2020-10-24: 2 mg via INTRAVENOUS

## 2020-10-24 MED ORDER — ONDANSETRON HCL 4 MG/2ML IJ SOLN
INTRAMUSCULAR | Status: AC
  Filled 2020-10-24: qty 2

## 2020-10-24 MED ORDER — PROPOFOL 10 MG/ML IV BOLUS
INTRAVENOUS | Status: DC | PRN
  Administered 2020-10-24: 200 mg via INTRAVENOUS

## 2020-10-24 MED ORDER — FERRIC SUBSULFATE SOLN
Status: DC | PRN
  Administered 2020-10-24: 1

## 2020-10-24 MED ORDER — DEXAMETHASONE SODIUM PHOSPHATE 10 MG/ML IJ SOLN
INTRAMUSCULAR | Status: AC
  Filled 2020-10-24: qty 1

## 2020-10-24 MED ORDER — ONDANSETRON 8 MG PO TBDP: 8.0000 mg | ORAL_TABLET | Freq: Three times a day (TID) | ORAL | 0 refills | Status: DC | PRN

## 2020-10-24 MED ORDER — POVIDONE-IODINE 10 % EX SWAB: 2.0000 "application " | Freq: Once | CUTANEOUS | Status: DC

## 2020-10-24 MED ORDER — LACTATED RINGERS IV SOLN: Freq: Once | INTRAVENOUS | Status: AC

## 2020-10-24 MED ORDER — DEXAMETHASONE SODIUM PHOSPHATE 10 MG/ML IJ SOLN
INTRAMUSCULAR | Status: DC | PRN
  Administered 2020-10-24: 10 mg via INTRAVENOUS

## 2020-10-24 SURGICAL SUPPLY — 28 items
BAG HAMPER (MISCELLANEOUS) ×2 IMPLANT
CLOTH BEACON ORANGE TIMEOUT ST (SAFETY) ×2 IMPLANT
COVER LIGHT HANDLE STERIS (MISCELLANEOUS) ×4 IMPLANT
COVER WAND RF STERILE (DRAPES) ×2 IMPLANT
ELECT REM PT RETURN 9FT ADLT (ELECTROSURGICAL) ×2
ELECTRODE REM PT RTRN 9FT ADLT (ELECTROSURGICAL) ×1 IMPLANT
GAUZE 4X4 16PLY RFD (DISPOSABLE) ×2 IMPLANT
GLOVE BIOGEL PI IND STRL 7.0 (GLOVE) ×2 IMPLANT
GLOVE BIOGEL PI IND STRL 8 (GLOVE) ×1 IMPLANT
GLOVE BIOGEL PI INDICATOR 7.0 (GLOVE) ×2
GLOVE BIOGEL PI INDICATOR 8 (GLOVE) ×1
GLOVE ECLIPSE 8.0 STRL XLNG CF (GLOVE) ×2 IMPLANT
GOWN STRL REUS W/TWL LRG LVL3 (GOWN DISPOSABLE) ×2 IMPLANT
GOWN STRL REUS W/TWL XL LVL3 (GOWN DISPOSABLE) ×2 IMPLANT
KIT TURNOVER KIT A (KITS) ×2 IMPLANT
LASER FIBER DISP 1000U (UROLOGICAL SUPPLIES) ×2 IMPLANT
MANIFOLD NEPTUNE II (INSTRUMENTS) ×2 IMPLANT
NS IRRIG 1000ML POUR BTL (IV SOLUTION) ×2 IMPLANT
PACK BASIC III (CUSTOM PROCEDURE TRAY) ×2
PACK SRG BSC III STRL LF ECLPS (CUSTOM PROCEDURE TRAY) ×1 IMPLANT
PAD ARMBOARD 7.5X6 YLW CONV (MISCELLANEOUS) ×2 IMPLANT
PREFILTER SMOKE EVAC (FILTER) ×2 IMPLANT
SCOPETTES 8  STERILE (MISCELLANEOUS) ×2
SCOPETTES 8 STERILE (MISCELLANEOUS) ×1 IMPLANT
SET BASIN LINEN APH (SET/KITS/TRAYS/PACK) ×2 IMPLANT
SHEET LAVH (DRAPES) ×2 IMPLANT
TUBING SMOKE EVAC CO2 (TUBING) ×2 IMPLANT
WATER STERILE IRR 1000ML POUR (IV SOLUTION) ×2 IMPLANT

## 2020-10-24 NOTE — Anesthesia Preprocedure Evaluation (Signed)
Anesthesia Evaluation  Patient identified by MRN, date of birth, ID band Patient awake    Reviewed: Allergy & Precautions, NPO status , Patient's Chart, lab work & pertinent test results  History of Anesthesia Complications Negative for: history of anesthetic complications  Airway Mallampati: II  TM Distance: >3 FB Neck ROM: Full    Dental  (+) Dental Advisory Given, Teeth Intact   Pulmonary neg pulmonary ROS,    Pulmonary exam normal breath sounds clear to auscultation       Cardiovascular Exercise Tolerance: Good Normal cardiovascular exam Rhythm:Regular Rate:Normal  POTS   Neuro/Psych PSYCHIATRIC DISORDERS Depression Bipolar Disorder negative neurological ROS     GI/Hepatic negative GI ROS, Neg liver ROS,   Endo/Other  negative endocrine ROS  Renal/GU negative Renal ROS     Musculoskeletal negative musculoskeletal ROS (+)   Abdominal   Peds  Hematology negative hematology ROS (+)   Anesthesia Other Findings   Reproductive/Obstetrics negative OB ROS                             Anesthesia Physical Anesthesia Plan  ASA: II  Anesthesia Plan: General   Post-op Pain Management:    Induction: Intravenous  PONV Risk Score and Plan: 4 or greater and Ondansetron, Dexamethasone and Midazolam  Airway Management Planned: LMA  Additional Equipment:   Intra-op Plan:   Post-operative Plan: Extubation in OR  Informed Consent: I have reviewed the patients History and Physical, chart, labs and discussed the procedure including the risks, benefits and alternatives for the proposed anesthesia with the patient or authorized representative who has indicated his/her understanding and acceptance.     Dental advisory given  Plan Discussed with: CRNA and Surgeon  Anesthesia Plan Comments:         Anesthesia Quick Evaluation

## 2020-10-24 NOTE — Transfer of Care (Signed)
Immediate Anesthesia Transfer of Care Note  Patient: Victoria [name]  Procedure(s) Performed: LASER ABLATION of the cervix (N/A )  Patient Location: PACU  Anesthesia Type:General  Level of Consciousness: awake, alert , oriented and patient cooperative  Airway & Oxygen Therapy: Patient Spontanous Breathing  Post-op Assessment: Report given to RN and Post -op Vital signs reviewed and stable  Post vital signs: Reviewed and stable  Last Vitals:  Vitals Value Taken Time  BP 112/60 10/24/20 1445  Temp    Pulse 83 10/24/20 1446  Resp 12 10/24/20 1446  SpO2 99 % 10/24/20 1446  Vitals shown include unvalidated device data.  Last Pain:  Vitals:   10/24/20 1141  TempSrc: Oral         Complications: No complications documented.

## 2020-10-24 NOTE — Discharge Instructions (Signed)
Cervical Laser Surgery, Care After This sheet gives you information about how to care for yourself after your procedure. Your health care provider may also give you more specific instructions. If you have problems or questions, contact your health care provider. What can I expect after the procedure? After the procedure, it is common to have:  Pain or discomfort.  Mild cramping.  Bleeding, spotting, or brownish discharge from your vagina. Follow these instructions at home: Activity  Rest as told by your health care provider.  Do not lift anything that is heavier than 10 lb (4.5 kg), or the limit that you are told, until your health care provider says that it is safe.  Do not have sex until your health care provider says it is okay.  Return to your normal activities as told by your health care provider. Ask your health care provider what activities are safe for you.   General instructions  Take over-the-counter and prescription medicines only as told by your health care provider.  Ask your health care provider if the medicine prescribed to you requires you to avoid driving or using heavy machinery.  Wear sanitary pads to absorb any bleeding, spotting, and discharge.  Do not douche or put anything into your vagina, including tampons, until your health care provider says it is okay.  It is up to you to get the results of your procedure. Ask your health care provider, or the department that is doing the procedure, when your results will be ready.  Keep all follow-up visits as told by your health care provider. This is important. Contact a health care provider if:  Your pain or cramping does not improve.  Your periods are more painful than usual.  You do not get your period as expected. Get help right away if you have:  Any symptoms of infection, such as: ? A fever. ? Chills. ? Discharge that smells bad.  Severe pain in your abdomen.  Heavy bleeding from your vagina (more  than a normal period).  Vaginal bleeding with clumps of blood (blood clots). Summary  After this procedure, it is common to have pain or discomfort and mild cramping. It is also common to have bleeding, spotting, or brownish discharge from your vagina.  Do not have sex, douche, use tampons, or put anything in your vagina until your health care provider says it is okay.  Return to your normal activities as told by your health care provider. Ask your health care provider what activities are safe for you.  Take over-the-counter and prescription medicines only as told by your health care provider.  You may need to wear sanitary pads to absorb any bleeding, spotting, and discharge. This information is not intended to replace advice given to you by your health care provider. Make sure you discuss any questions you have with your health care provider. Document Revised: 03/12/2019 Document Reviewed: 03/12/2019 Elsevier Patient Education  2021 Elsevier Inc.    General Anesthesia, Adult, Care After This sheet gives you information about how to care for yourself after your procedure. Your health care provider may also give you more specific instructions. If you have problems or questions, contact your health care provider. What can I expect after the procedure? After the procedure, the following side effects are common:  Pain or discomfort at the IV site.  Nausea.  Vomiting.  Sore throat.  Trouble concentrating.  Feeling cold or chills.  Feeling weak or tired.  Sleepiness and fatigue.  Soreness and body aches.   These side effects can affect parts of the body that were not involved in surgery. Follow these instructions at home: For the time period you were told by your health care provider:  Rest.  Do not participate in activities where you could fall or become injured.  Do not drive or use machinery.  Do not drink alcohol.  Do not take sleeping pills or medicines that cause  drowsiness.  Do not make important decisions or sign legal documents.  Do not take care of children on your own.   Eating and drinking  Follow any instructions from your health care provider about eating or drinking restrictions.  When you feel hungry, start by eating small amounts of foods that are soft and easy to digest (bland), such as toast. Gradually return to your regular diet.  Drink enough fluid to keep your urine pale yellow.  If you vomit, rehydrate by drinking water, juice, or clear broth. General instructions  If you have sleep apnea, surgery and certain medicines can increase your risk for breathing problems. Follow instructions from your health care provider about wearing your sleep device: ? Anytime you are sleeping, including during daytime naps. ? While taking prescription pain medicines, sleeping medicines, or medicines that make you drowsy.  Have a responsible adult stay with you for the time you are told. It is important to have someone help care for you until you are awake and alert.  Return to your normal activities as told by your health care provider. Ask your health care provider what activities are safe for you.  Take over-the-counter and prescription medicines only as told by your health care provider.  If you smoke, do not smoke without supervision.  Keep all follow-up visits as told by your health care provider. This is important. Contact a health care provider if:  You have nausea or vomiting that does not get better with medicine.  You cannot eat or drink without vomiting.  You have pain that does not get better with medicine.  You are unable to pass urine.  You develop a skin rash.  You have a fever.  You have redness around your IV site that gets worse. Get help right away if:  You have difficulty breathing.  You have chest pain.  You have blood in your urine or stool, or you vomit blood. Summary  After the procedure, it is common  to have a sore throat or nausea. It is also common to feel tired.  Have a responsible adult stay with you for the time you are told. It is important to have someone help care for you until you are awake and alert.  When you feel hungry, start by eating small amounts of foods that are soft and easy to digest (bland), such as toast. Gradually return to your regular diet.  Drink enough fluid to keep your urine pale yellow.  Return to your normal activities as told by your health care provider. Ask your health care provider what activities are safe for you. This information is not intended to replace advice given to you by your health care provider. Make sure you discuss any questions you have with your health care provider. Document Revised: 06/12/2020 Document Reviewed: 01/10/2020 Elsevier Patient Education  2021 Elsevier Inc.   

## 2020-10-24 NOTE — Anesthesia Postprocedure Evaluation (Signed)
Anesthesia Post Note  Patient: Victoria [name]  Procedure(s) Performed: LASER ABLATION of the cervix (N/A )  Patient location during evaluation: PACU Anesthesia Type: General Level of consciousness: awake and alert, oriented and patient cooperative Pain management: pain level controlled Vital Signs Assessment: post-procedure vital signs reviewed and stable Respiratory status: spontaneous breathing, nonlabored ventilation and respiratory function stable Cardiovascular status: blood pressure returned to baseline and stable Postop Assessment: no headache, no backache and no apparent nausea or vomiting Anesthetic complications: no   No complications documented.   Last Vitals:  Vitals:   10/24/20 1141  BP: 116/74  Pulse: 90  Resp: 16  Temp: 36.8 C  SpO2: 100%    Last Pain:  Vitals:   10/24/20 1141  TempSrc: Oral                 Taden Witter

## 2020-10-24 NOTE — Op Note (Signed)
Preoperative Diagnosis:  High Grade Squamous Intraepithelial lesion, adequate colposcopy                                          Large expansive lesion  Postoperative Diagnosis:  Same as above  Procedure:  Laser ablation of the cervix  Surgeon:  Lazaro Arms MD  Anaesthesia:  Laryngeal Mask Airway  Findings:  Patient had an abnormal pap smear which was evaluated in the office with colposcopy and directed biopsies.  Pathology report returned as high Grade SIL.  The colposcopy was adequate.  The area of abnormality is quite large.  As a result, the patient is admitted for laser ablation of the cervix.  Description of Note:  Patient was taken to the OR and placed in the supine position where she underwent laryngeal mask airway anaesthesia.  She was placed in the dorsal lithotomy position.  She was draped for laser.  Graves speculum was placed and 3% acetic acid used and the laser microscope employed to perform colposcopy which confirmed the office findings.  Laser was used on typical cervical settings and used to vaporized the squamocolumnar junction to  depth of  5-7 mm peripherally and 7-9 mm centrally.  Surgical margin of several mm was employed beyond the acetowhite epithelium.  Hemostasis was achieved with the laser and Monsel's solution.  Patient was awakened from anaesthesia in good stable condition and all counts were correct.  She received Ancef 2 gram and Toradol 30 mg IV preoperatively prophylactically.  Lazaro Arms, MD 10/24/2020 2:31 PM

## 2020-10-24 NOTE — Interval H&P Note (Signed)
History and Physical Interval Note:  10/24/2020 1:23 PM  Victoria [name]  has presented today for surgery, with the diagnosis of HIgh Grade Cervical Dysplasia.  The various methods of treatment have been discussed with the patient and family. After consideration of risks, benefits and other options for treatment, the patient has consented to  Procedure(s): LASER ABLATION of the cervix (N/A) as a surgical intervention.  The patient's history has been reviewed, patient examined, no change in status, stable for surgery.  I have reviewed the patient's chart and labs.  Questions were answered to the patient's satisfaction.     Lazaro Arms

## 2020-10-24 NOTE — H&P (Signed)
Preoperative History and Physical  Victoria [name] is a 25 y.o. G0P0000 with Patient's last menstrual period was 08/30/2020. admitted for a laser ablation of the cervix.  colpo directed biopsies HSIL  PMH:        Past Medical History:  Diagnosis Date  . Abnormal Papanicolaou smear of cervix with positive human papilloma virus (HPV) test 09/08/2020   Pap was LSIL +HPV:  Needs colpo per ASCCP guidelines,  CIN 3+ immediate risk is 4.3%______________  . Bipolar disorder (HCC)   . POTS (postural orthostatic tachycardia syndrome)     PSH:          Past Surgical History:  Procedure Laterality Date  . EXTRACORPOREAL CIRCULATION      POb/GynH:              OB History    Gravida  0   Para  0   Term  0   Preterm  0   AB  0   Living  0     SAB  0   IAB  0   Ectopic  0   Multiple  0   Live Births  0           SH:   Social History        Tobacco Use  . Smoking status: Never Smoker  . Smokeless tobacco: Never Used  Vaping Use  . Vaping Use: Former  Substance Use Topics  . Alcohol use: Yes    Comment: occ  . Drug use: No    FH:         Family History  Problem Relation Age of Onset  . Diabetes Paternal Grandfather   . Colon cancer Maternal Grandfather      Allergies: No Known Allergies  Medications:       Current Outpatient Medications:  .  etonogestrel (NEXPLANON) 68 MG IMPL implant, 1 each by Subdermal route once., Disp: , Rfl:  .  fludrocortisone (FLORINEF) 0.1 MG tablet, Take 1 tablet (0.1 mg total) by mouth daily., Disp: 30 tablet, Rfl: 3 .  lamoTRIgine (LAMICTAL) 200 MG tablet, Take 200 mg by mouth daily., Disp: , Rfl:  .  sertraline (ZOLOFT) 100 MG tablet, Take 200 mg by mouth daily., Disp: , Rfl:   Review of Systems:   Review of Systems  Constitutional: Negative for fever, chills, weight loss, malaise/fatigue and diaphoresis.  HENT: Negative for hearing loss, ear pain, nosebleeds, congestion, sore throat,  neck pain, tinnitus and ear discharge.   Eyes: Negative for blurred vision, double vision, photophobia, pain, discharge and redness.  Respiratory: Negative for cough, hemoptysis, sputum production, shortness of breath, wheezing and stridor.   Cardiovascular: Negative for chest pain, palpitations, orthopnea, claudication, leg swelling and PND.  Gastrointestinal: Positive for abdominal pain. Negative for heartburn, nausea, vomiting, diarrhea, constipation, blood in stool and melena.  Genitourinary: Negative for dysuria, urgency, frequency, hematuria and flank pain.  Musculoskeletal: Negative for myalgias, back pain, joint pain and falls.  Skin: Negative for itching and rash.  Neurological: Negative for dizziness, tingling, tremors, sensory change, speech change, focal weakness, seizures, loss of consciousness, weakness and headaches.  Endo/Heme/Allergies: Negative for environmental allergies and polydipsia. Does not bruise/bleed easily.  Psychiatric/Behavioral: Negative for depression, suicidal ideas, hallucinations, memory loss and substance abuse. The patient is not nervous/anxious and does not have insomnia.      PHYSICAL EXAM:  Last menstrual period 08/30/2020.    Vitals reviewed. Constitutional: She is oriented to person, place, and time. She appears  well-developed and well-nourished.  HENT:  Head: Normocephalic and atraumatic.  Right Ear: External ear normal.  Left Ear: External ear normal.  Nose: Nose normal.  Mouth/Throat: Oropharynx is clear and moist.  Eyes: Conjunctivae and EOM are normal. Pupils are equal, round, and reactive to light. Right eye exhibits no discharge. Left eye exhibits no discharge. No scleral icterus.  Neck: Normal range of motion. Neck supple. No tracheal deviation present. No thyromegaly present.  Cardiovascular: Normal rate, regular rhythm, normal heart sounds and intact distal pulses.  Exam reveals no gallop and no friction rub.   No murmur  heard. Respiratory: Effort normal and breath sounds normal. No respiratory distress. She has no wheezes. She has no rales. She exhibits no tenderness.  GI: Soft. Bowel sounds are normal. She exhibits no distension and no mass. There is tenderness. There is no rebound and no guarding.  Genitourinary:       Vulva is normal without lesions Vagina is pink moist without discharge Cervix normal in appearance  Uterus is normal size, contour, position, consistency, mobility, non-tender Adnexa is negative with normal sized ovaries by sonogram  Musculoskeletal: Normal range of motion. She exhibits no edema and no tenderness.  Neurological: She is alert and oriented to person, place, and time. She has normal reflexes. She displays normal reflexes. No cranial nerve deficit. She exhibits normal muscle tone. Coordination normal.  Skin: Skin is warm and dry. No rash noted. No erythema. No pallor.  Psychiatric: She has a normal mood and affect. Her behavior is normal. Judgment and thought content normal.    Labs:      Results for orders placed or performed in visit on 09/15/20 (from the past 336 hour(s))  POCT urine pregnancy   Collection Time: 09/15/20  2:59 PM  Result Value Ref Range   Preg Test, Ur Negative Negative  Results for orders placed or performed in visit on 09/12/20 (from the past 336 hour(s))  POCT urine pregnancy   Collection Time: 09/12/20  1:27 PM  Result Value Ref Range   Preg Test, Ur Negative Negative    Results for orders placed or performed during the hospital encounter of 10/23/20 (from the past 48 hour(s))  SARS CORONAVIRUS 2 (TAT 6-24 HRS) Nasopharyngeal Nasopharyngeal Swab     Status: None   Collection Time: 10/23/20  9:43 AM   Specimen: Nasopharyngeal Swab  Result Value Ref Range   SARS Coronavirus 2 NEGATIVE NEGATIVE    Comment: (NOTE) SARS-CoV-2 target nucleic acids are NOT DETECTED.  The SARS-CoV-2 RNA is generally detectable in upper and lower respiratory  specimens during the acute phase of infection. Negative results do not preclude SARS-CoV-2 infection, do not rule out co-infections with other pathogens, and should not be used as the sole basis for treatment or other patient management decisions. Negative results must be combined with clinical observations, patient history, and epidemiological information. The expected result is Negative.  Fact Sheet for Patients: HairSlick.no  Fact Sheet for Healthcare Providers: quierodirigir.com  This test is not yet approved or cleared by the [place] FDA and  has been authorized for detection and/or diagnosis of SARS-CoV-2 by FDA under an Emergency Use Authorization (EUA). This EUA will remain  in effect (meaning this test can be used) for the duration of the COVID-19 declaration under Se ction 564(b)(1) of the Act, 21 U.S.C. section 360bbb-3(b)(1), unless the authorization is terminated or revoked sooner.  Performed at [facility], [address] [place], [place] [address]   CBC  Status: None   Collection Time: 10/23/20  9:43 AM  Result Value Ref Range   WBC 5.3 4.0 - 10.5 K/uL   RBC 4.38 3.87 - 5.11 MIL/uL   Hemoglobin 13.6 12.0 - 15.0 g/dL   HCT 26.3 78.5 - 88.5 %   MCV 92.9 80.0 - 100.0 fL   MCH 31.1 26.0 - 34.0 pg   MCHC 33.4 30.0 - 36.0 g/dL   RDW 02.7 74.1 - 28.7 %   Platelets 340 150 - 400 K/uL   nRBC 0.0 0.0 - 0.2 %    Comment: Performed at [facility], [address], [place], [place] [address]  Comprehensive metabolic panel     Status: Abnormal   Collection Time: 10/23/20  9:43 AM  Result Value Ref Range   Sodium 138 135 - 145 mmol/L   Potassium 3.5 3.5 - 5.1 mmol/L   Chloride 105 98 - 111 mmol/L   CO2 25 22 - 32 mmol/L   Glucose, Bld 99 70 - 99 mg/dL    Comment: Glucose reference range applies only to samples taken after fasting for at least 8 hours.   BUN 8 6 - 20 mg/dL   Creatinine, Ser 2.09 0.44  - 1.00 mg/dL   Calcium 9.1 8.9 - 47.0 mg/dL   Total Protein 7.0 6.5 - 8.1 g/dL   Albumin 4.0 3.5 - 5.0 g/dL   AST 13 (L) 15 - 41 U/L   ALT 10 0 - 44 U/L   Alkaline Phosphatase 58 38 - 126 U/L   Total Bilirubin 0.5 0.3 - 1.2 mg/dL   GFR, Estimated >96 >28 mL/min    Comment: (NOTE) Calculated using the CKD-EPI Creatinine Equation (2021)    Anion gap 8 5 - 15    Comment: Performed at [facility], [address] [place], [place] [address]  hCG, quantitative, pregnancy     Status: None   Collection Time: 10/23/20  9:43 AM  Result Value Ref Range   hCG, Beta Chain, Quant, S <1 <5 mIU/mL    Comment:          GEST. AGE      CONC.  (mIU/mL)   <=1 WEEK        5 - 50     2 WEEKS       50 - 500     3 WEEKS       100 - 10,000     4 WEEKS     1,000 - 30,000     5 WEEKS     3,500 - 115,000   6-8 WEEKS     12,000 - 270,000    12 WEEKS     15,000 - 220,000        FEMALE AND NON-PREGNANT FEMALE:     LESS THAN 5 mIU/mL Performed at [facility], [address], [place], [place] [address]   Rapid HIV screen (HIV 1/2 Ab+Ag)     Status: None   Collection Time: 10/23/20  9:43 AM  Result Value Ref Range   HIV-1 P24 Antigen - HIV24 NON REACTIVE NON REACTIVE    Comment: (NOTE) Detection of p24 may be inhibited by biotin in the sample, causing false negative results in acute infection.    HIV 1/2 Antibodies NON REACTIVE NON REACTIVE   Interpretation (HIV Ag Ab)      A non reactive test result means that HIV 1 or HIV 2 antibodies and HIV 1 p24 antigen were not detected in the specimen.  Comment: Performed at [facility], [address], [place], [place] [address]  Urinalysis, Routine w reflex microscopic Urine, Clean Catch     Status: Abnormal   Collection Time: 10/23/20  9:43 AM  Result Value Ref Range   Color, Urine YELLOW YELLOW   APPearance CLEAR CLEAR   Specific Gravity, Urine 1.019 1.005 - 1.030   pH 5.0 5.0 - 8.0   Glucose, UA NEGATIVE NEGATIVE mg/dL   Hgb urine dipstick MODERATE (A)  NEGATIVE   Bilirubin Urine NEGATIVE NEGATIVE   Ketones, ur NEGATIVE NEGATIVE mg/dL   Protein, ur NEGATIVE NEGATIVE mg/dL   Nitrite NEGATIVE NEGATIVE   Leukocytes,Ua NEGATIVE NEGATIVE   RBC / HPF 0-5 0 - 5 RBC/hpf   WBC, UA 0-5 0 - 5 WBC/hpf   Bacteria, UA NONE SEEN NONE SEEN   Squamous Epithelial / LPF 0-5 0 - 5   Mucus PRESENT     Comment: Performed at [facility], [address] [place], [place] [address]   EKG:    Orders placed or performed during the hospital encounter of 06/30/18  . ED EKG  . ED EKG    Imaging Studies: Imaging Results  No results found.      Assessment: High grade cervical dysplsia  Plan: Laser ablation of the cervix, tentatively, 10/17/20  Victoria [name] 09/23/2020 11:43 AM

## 2020-10-30 ENCOUNTER — Encounter (HOSPITAL_COMMUNITY): Payer: Self-pay | Admitting: Obstetrics & Gynecology

## 2020-11-03 ENCOUNTER — Encounter: Payer: Self-pay | Admitting: Obstetrics & Gynecology

## 2020-11-03 ENCOUNTER — Other Ambulatory Visit: Payer: Self-pay

## 2020-11-03 ENCOUNTER — Ambulatory Visit (INDEPENDENT_AMBULATORY_CARE_PROVIDER_SITE_OTHER): Payer: Managed Care, Other (non HMO) | Admitting: Obstetrics & Gynecology

## 2020-11-03 VITALS — BP 104/70 | HR 90 | Ht 64.0 in | Wt 157.0 lb

## 2020-11-03 DIAGNOSIS — Z9889 Other specified postprocedural states: Secondary | ICD-10-CM

## 2020-11-03 NOTE — Progress Notes (Signed)
  HPI: Patient returns for routine postoperative follow-up having undergone laser ablation of the cervix on 10/24/20.  The patient's immediate postoperative recovery has been unremarkable. Since hospital discharge the patient reports no problems.   Current Outpatient Medications: etonogestrel (NEXPLANON) 68 MG IMPL implant, 1 each by Subdermal route once., Disp: , Rfl:  ketorolac (TORADOL) 10 MG tablet, Take 1 tablet (10 mg total) by mouth every 8 (eight) hours as needed., Disp: 15 tablet, Rfl: 0 lamoTRIgine (LAMICTAL) 200 MG tablet, Take 200 mg by mouth daily., Disp: , Rfl:  ondansetron (ZOFRAN ODT) 8 MG disintegrating tablet, Take 1 tablet (8 mg total) by mouth every 8 (eight) hours as needed for nausea or vomiting., Disp: 8 tablet, Rfl: 0 sertraline (ZOLOFT) 100 MG tablet, Take 200 mg by mouth daily., Disp: , Rfl:   No current facility-administered medications for this visit.    Blood pressure 104/70, pulse 90, height 5\' 4"  (1.626 m), weight 157 lb (71.2 kg), last menstrual period 10/09/2020.  Physical Exam: Normal healing of laser bed of the cervix  Diagnostic Tests: normal  Pathology: HSIL on cx bx  Impression:   ICD-10-CM   1. S/P laser ablation of the cervix for HSIL 10/24/20  Z98.890       Plan: n sex for 4-5 more weeks    Follow up: Return in about 6 months (around 05/03/2021) for Follow up pap.     05/05/2021, MD

## 2020-12-02 ENCOUNTER — Other Ambulatory Visit: Payer: Self-pay

## 2020-12-02 ENCOUNTER — Ambulatory Visit (INDEPENDENT_AMBULATORY_CARE_PROVIDER_SITE_OTHER): Payer: Managed Care, Other (non HMO) | Admitting: Internal Medicine

## 2020-12-02 ENCOUNTER — Encounter: Payer: Self-pay | Admitting: Internal Medicine

## 2020-12-02 VITALS — BP 103/61 | HR 88 | Temp 98.3°F | Ht 64.0 in | Wt 158.0 lb

## 2020-12-02 DIAGNOSIS — R87613 High grade squamous intraepithelial lesion on cytologic smear of cervix (HGSIL): Secondary | ICD-10-CM

## 2020-12-02 DIAGNOSIS — R69 Illness, unspecified: Secondary | ICD-10-CM | POA: Diagnosis not present

## 2020-12-02 DIAGNOSIS — F332 Major depressive disorder, recurrent severe without psychotic features: Secondary | ICD-10-CM

## 2020-12-02 DIAGNOSIS — I498 Other specified cardiac arrhythmias: Secondary | ICD-10-CM | POA: Diagnosis not present

## 2020-12-02 DIAGNOSIS — G90A Postural orthostatic tachycardia syndrome (POTS): Secondary | ICD-10-CM

## 2020-12-02 DIAGNOSIS — Z0000 Encounter for general adult medical examination without abnormal findings: Secondary | ICD-10-CM | POA: Diagnosis not present

## 2020-12-02 MED ORDER — FLUDROCORTISONE ACETATE 0.1 MG PO TABS: 0.1000 mg | ORAL_TABLET | Freq: Two times a day (BID) | ORAL | 1 refills | Status: DC

## 2020-12-02 NOTE — Addendum Note (Signed)
Addended byTrena Platt on: 12/02/2020 04:16 PM   Modules accepted: Orders

## 2020-12-02 NOTE — Assessment & Plan Note (Signed)
Well-controlled on Zoloft and Lamictal Referral for Psychiatry provided as per patient request for local care. - will try to follow up with referral.

## 2020-12-02 NOTE — Patient Instructions (Signed)
Please start taking Fludrocortisone 0.1 mg twice daily. Please take Potassium supplement  - Potassium gluconate 550 mg once daily.  Please maintain proper hydration by taking at least 60 ounces of fluid in a day.  Please continue to follow up with OB/GYN as scheduled.

## 2020-12-02 NOTE — Progress Notes (Signed)
Established Patient Office Visit  Subjective:  Patient ID: Victoria [name], female    DOB: 04-27-1996  Age: 25 y.o. MRN: [id number]  CC:  Chief Complaint  Patient presents with  . Annual Exam    CPE    HPI Victoria [name] is a 25 year old female with past medical history of POTS and bipolar disorder who presents for annual physical.  She has been doing well overall. She has been taking Fludrocortisone for POTS, but still has been having mild dizziness at times. She denies any episodes of LOC or syncope.  She has been taking Lamictal and Zoloft for her depression/bipolar disorder. She has not been able to follow up with a local Psychiatrist. Denies any suicidal or homicidal ideation.  She was found to have HSIL on PAP smear and had laser ablation in 10/2020.  Past Medical History:  Diagnosis Date  . Abnormal Papanicolaou smear of cervix with positive human papilloma virus (HPV) test 09/08/2020   Pap was LSIL +HPV:  Needs colpo per ASCCP guidelines,  CIN 3+ immediate risk is 4.3%______________  . Bipolar disorder (HCC)   . POTS (postural orthostatic tachycardia syndrome)     Past Surgical History:  Procedure Laterality Date  . EXTRACORPOREAL CIRCULATION    . LASER ABLATION CONDOLAMATA N/A 10/24/2020   Procedure: LASER ABLATION of the cervix;  Surgeon: Lazaro Arms, MD;  Location: [facility];  Service: Gynecology;  Laterality: N/A;    Family History  Problem Relation Age of Onset  . Diabetes Paternal Grandfather   . Colon cancer Maternal Grandfather     Social History   Socioeconomic History  . Marital status: Legally Separated    Spouse name: Not on file  . Number of children: Not on file  . Years of education: Not on file  . Highest education level: Not on file  Occupational History  . Not on file  Tobacco Use  . Smoking status: Never Smoker  . Smokeless tobacco: Never Used  Vaping Use  . Vaping Use: Former  Substance and Sexual Activity  . Alcohol use: Yes     Comment: occ  . Drug use: No  . Sexual activity: Not Currently    Birth control/protection: Implant  Other Topics Concern  . Not on file  Social History Narrative  . Not on file   Social Determinants of Health   Financial Resource Strain: Low Risk   . Difficulty of Paying Living Expenses: Not very hard  Food Insecurity: No Food Insecurity  . Worried About Programme researcher, broadcasting/film/video in the Last Year: Never true  . Ran Out of Food in the Last Year: Never true  Transportation Needs: No Transportation Needs  . Lack of Transportation (Medical): No  . Lack of Transportation (Non-Medical): No  Physical Activity: Insufficiently Active  . Days of Exercise per Week: 3 days  . Minutes of Exercise per Session: 30 min  Stress: No Stress Concern Present  . Feeling of Stress : Only a little  Social Connections: Socially Isolated  . Frequency of Communication with Friends and Family: Three times a week  . Frequency of Social Gatherings with Friends and Family: Twice a week  . Attends Religious Services: Never  . Active Member of Clubs or Organizations: No  . Attends Banker Meetings: Never  . Marital Status: Separated  Intimate Partner Violence: At Risk  . Fear of Current or Ex-Partner: Yes  . Emotionally Abused: No  . Physically Abused: No  . Sexually Abused:  No    Outpatient Medications Prior to Visit  Medication Sig Dispense Refill  . etonogestrel (NEXPLANON) 68 MG IMPL implant 1 each by Subdermal route once.    . lamoTRIgine (LAMICTAL) 200 MG tablet Take 200 mg by mouth daily.    . ondansetron (ZOFRAN ODT) 8 MG disintegrating tablet Take 1 tablet (8 mg total) by mouth every 8 (eight) hours as needed for nausea or vomiting. 8 tablet 0  . sertraline (ZOLOFT) 100 MG tablet Take 200 mg by mouth daily.    . fludrocortisone (FLORINEF) 0.1 MG tablet Take 0.1 mg by mouth daily.    Marland Kitchen UNABLE TO FIND Fludocortisone 0.1mg  daily    . ketorolac (TORADOL) 10 MG tablet Take 1 tablet (10 mg  total) by mouth every 8 (eight) hours as needed. (Patient not taking: Reported on 12/02/2020) 15 tablet 0   No facility-administered medications prior to visit.    No Known Allergies  ROS Review of Systems  Constitutional: Negative for chills and fever.  HENT: Negative for congestion, sinus pressure, sinus pain and sore throat.   Eyes: Negative for pain, discharge and redness.  Respiratory: Negative for cough and shortness of breath.   Cardiovascular: Negative for chest pain, palpitations and leg swelling.  Gastrointestinal: Negative for abdominal pain, constipation, diarrhea, nausea and vomiting.  Endocrine: Negative for polydipsia and polyuria.  Genitourinary: Negative for dysuria and hematuria.  Musculoskeletal: Negative for back pain, neck pain and neck stiffness.  Skin: Negative for rash.  Neurological: Negative for dizziness, weakness, numbness and headaches.  Hematological: Does not bruise/bleed easily.  Psychiatric/Behavioral: Negative for agitation and behavioral problems.      Objective:    Physical Exam Vitals reviewed.  Constitutional:      General: She is not in acute distress.    Appearance: She is not diaphoretic.  HENT:     Head: Normocephalic and atraumatic.     Nose: Nose normal. No congestion.     Mouth/Throat:     Mouth: Mucous membranes are moist.     Pharynx: No posterior oropharyngeal erythema.  Eyes:     General: No scleral icterus.    Extraocular Movements: Extraocular movements intact.     Pupils: Pupils are equal, round, and reactive to light.  Cardiovascular:     Rate and Rhythm: Normal rate and regular rhythm.     Pulses: Normal pulses.     Heart sounds: Normal heart sounds. No murmur heard.   Pulmonary:     Breath sounds: Normal breath sounds. No wheezing or rales.  Abdominal:     Palpations: Abdomen is soft.     Tenderness: There is no abdominal tenderness.  Musculoskeletal:     Cervical back: Neck supple. No tenderness.     Right  lower leg: No edema.     Left lower leg: No edema.  Skin:    General: Skin is warm.     Findings: No rash.  Neurological:     General: No focal deficit present.     Mental Status: She is alert and oriented to person, place, and time.     Cranial Nerves: No cranial nerve deficit.     Sensory: No sensory deficit.     Motor: No weakness.  Psychiatric:        Mood and Affect: Mood normal.        Behavior: Behavior normal.     BP 103/61 (BP Location: Right Arm, Patient Position: Sitting, Cuff Size: Normal)   Pulse 88   Temp  98.3 F (36.8 C) (Temporal)   Ht 5\' 4"  (1.626 m)   Wt 158 lb (71.7 kg)   LMP 12/02/2020   SpO2 98%   BMI 27.12 kg/m  Wt Readings from Last 3 Encounters:  12/02/20 158 lb (71.7 kg)  11/03/20 157 lb (71.2 kg)  10/23/20 150 lb (68 kg)     Health Maintenance Due  Topic Date Due  . Hepatitis C Screening  Never done    There are no preventive care reminders to display for this patient.  Lab Results  Component Value Date   TSH 0.949 08/28/2020   Lab Results  Component Value Date   WBC 5.3 10/23/2020   HGB 13.6 10/23/2020   HCT 40.7 10/23/2020   MCV 92.9 10/23/2020   PLT 340 10/23/2020   Lab Results  Component Value Date   NA 138 10/23/2020   K 3.5 10/23/2020   CO2 25 10/23/2020   GLUCOSE 99 10/23/2020   BUN 8 10/23/2020   CREATININE 0.82 10/23/2020   BILITOT 0.5 10/23/2020   ALKPHOS 58 10/23/2020   AST 13 (L) 10/23/2020   ALT 10 10/23/2020   PROT 7.0 10/23/2020   ALBUMIN 4.0 10/23/2020   CALCIUM 9.1 10/23/2020   ANIONGAP 8 10/23/2020   No results found for: CHOL No results found for: HDL No results found for: LDLCALC No results found for: TRIG No results found for: CHOLHDL No results found for: 10/25/2020    Assessment & Plan:   Problem List Items Addressed This Visit      Annual physical exam Annual exam as documented. Counseling done  re healthy lifestyle involving commitment to 150 minutes exercise per week, heart healthy  diet, and attaining healthy weight.The importance of adequate sleep also discussed. Changes in health habits are decided on by the patient with goals and time frames  set for achieving them. Immunization and cancer screening needs are specifically addressed at this visit.   Cardiovascular and Mediastinum   Postural orthostatic tachycardia syndrome    Takes Fludrocortisone 0.1 mg QD C/o mild dizziness at times BP in lower normal range Increased Fludrocortisone to 0.1 mg BID Advised to avoid sudden changes in posture CMP wnl        Other   Major depressive disorder, recurrent, severe without psychotic features (HCC)    Well-controlled on Zoloft and Lamictal Referral for Psychiatry provided as per patient request for local care. - will try to follow up with referral.      High grade squamous intraepithelial cervical dysplasia    S/p laser ablation No acute symptoms like intermenstrual bleeding or discharge       Meds ordered this encounter  Medications  . fludrocortisone (FLORINEF) 0.1 MG tablet    Sig: Take 1 tablet (0.1 mg total) by mouth 2 (two) times daily.    Dispense:  60 tablet    Refill:  1    Follow-up: Return in about 6 months (around 06/01/2021).    06/03/2021, MD

## 2020-12-02 NOTE — Assessment & Plan Note (Signed)
Takes Fludrocortisone 0.1 mg QD C/o mild dizziness at times BP in lower normal range Increased Fludrocortisone to 0.1 mg BID Advised to avoid sudden changes in posture CMP wnl

## 2020-12-02 NOTE — Assessment & Plan Note (Signed)
S/p laser ablation No acute symptoms like intermenstrual bleeding or discharge

## 2021-01-08 ENCOUNTER — Ambulatory Visit (INDEPENDENT_AMBULATORY_CARE_PROVIDER_SITE_OTHER): Payer: 59 | Admitting: Psychology

## 2021-01-08 DIAGNOSIS — F319 Bipolar disorder, unspecified: Secondary | ICD-10-CM

## 2021-01-08 DIAGNOSIS — R69 Illness, unspecified: Secondary | ICD-10-CM | POA: Diagnosis not present

## 2021-01-15 ENCOUNTER — Ambulatory Visit (INDEPENDENT_AMBULATORY_CARE_PROVIDER_SITE_OTHER): Payer: 59 | Admitting: Psychology

## 2021-01-15 DIAGNOSIS — R69 Illness, unspecified: Secondary | ICD-10-CM | POA: Diagnosis not present

## 2021-01-15 DIAGNOSIS — F319 Bipolar disorder, unspecified: Secondary | ICD-10-CM

## 2021-02-12 ENCOUNTER — Ambulatory Visit (INDEPENDENT_AMBULATORY_CARE_PROVIDER_SITE_OTHER): Payer: 59 | Admitting: Psychology

## 2021-02-12 DIAGNOSIS — F319 Bipolar disorder, unspecified: Secondary | ICD-10-CM

## 2021-02-12 DIAGNOSIS — R69 Illness, unspecified: Secondary | ICD-10-CM | POA: Diagnosis not present

## 2021-02-26 ENCOUNTER — Ambulatory Visit (INDEPENDENT_AMBULATORY_CARE_PROVIDER_SITE_OTHER): Payer: 59 | Admitting: Psychology

## 2021-02-26 DIAGNOSIS — F319 Bipolar disorder, unspecified: Secondary | ICD-10-CM

## 2021-02-26 DIAGNOSIS — R69 Illness, unspecified: Secondary | ICD-10-CM | POA: Diagnosis not present

## 2021-03-12 ENCOUNTER — Ambulatory Visit (INDEPENDENT_AMBULATORY_CARE_PROVIDER_SITE_OTHER): Payer: 59 | Admitting: Psychology

## 2021-03-12 DIAGNOSIS — F319 Bipolar disorder, unspecified: Secondary | ICD-10-CM | POA: Diagnosis not present

## 2021-03-12 DIAGNOSIS — R69 Illness, unspecified: Secondary | ICD-10-CM | POA: Diagnosis not present

## 2021-04-02 ENCOUNTER — Ambulatory Visit (INDEPENDENT_AMBULATORY_CARE_PROVIDER_SITE_OTHER): Payer: 59 | Admitting: Psychology

## 2021-04-02 DIAGNOSIS — F319 Bipolar disorder, unspecified: Secondary | ICD-10-CM | POA: Diagnosis not present

## 2021-04-02 DIAGNOSIS — R69 Illness, unspecified: Secondary | ICD-10-CM | POA: Diagnosis not present

## 2021-04-07 ENCOUNTER — Other Ambulatory Visit: Payer: Self-pay

## 2021-04-07 ENCOUNTER — Telehealth: Payer: Self-pay

## 2021-04-07 DIAGNOSIS — G90A Postural orthostatic tachycardia syndrome (POTS): Secondary | ICD-10-CM

## 2021-04-07 MED ORDER — FLUDROCORTISONE ACETATE 0.1 MG PO TABS: 0.1000 mg | ORAL_TABLET | Freq: Two times a day (BID) | ORAL | 1 refills | Status: DC

## 2021-04-07 NOTE — Telephone Encounter (Signed)
Pt needs Fludrocortisone called in --if there is a problem   PLEASE call the pt to advise

## 2021-04-07 NOTE — Telephone Encounter (Signed)
Rx sent 

## 2021-04-14 ENCOUNTER — Encounter: Payer: Self-pay | Admitting: Women's Health

## 2021-04-14 ENCOUNTER — Other Ambulatory Visit (HOSPITAL_COMMUNITY)
Admission: RE | Admit: 2021-04-14 | Discharge: 2021-04-14 | Disposition: A | Discharge status: Home / Self Care | Payer: 59 | Source: Ambulatory Visit | Attending: Obstetrics & Gynecology | Admitting: Obstetrics & Gynecology

## 2021-04-14 ENCOUNTER — Ambulatory Visit (INDEPENDENT_AMBULATORY_CARE_PROVIDER_SITE_OTHER): Payer: 59 | Admitting: Women's Health

## 2021-04-14 ENCOUNTER — Other Ambulatory Visit: Payer: Self-pay

## 2021-04-14 VITALS — BP 118/71 | HR 77 | Wt 170.2 lb

## 2021-04-14 DIAGNOSIS — Z124 Encounter for screening for malignant neoplasm of cervix: Secondary | ICD-10-CM

## 2021-04-14 DIAGNOSIS — R87613 High grade squamous intraepithelial lesion on cytologic smear of cervix (HGSIL): Secondary | ICD-10-CM | POA: Insufficient documentation

## 2021-04-14 DIAGNOSIS — N941 Unspecified dyspareunia: Secondary | ICD-10-CM

## 2021-04-14 DIAGNOSIS — N942 Vaginismus: Secondary | ICD-10-CM | POA: Diagnosis not present

## 2021-04-14 NOTE — Progress Notes (Signed)
   GYN VISIT (Pap only) Patient name: Victoria [name] MRN [id number]  Date of birth: 1996-09-08 Chief Complaint:   Abnormal Pap Smear (6 month repeat pap smear)  History of Present Illness:   Victoria [name] is a 25 y.o. G0P0000 Caucasian female being seen today for f/u pap after laser ablation of cervix. Reports pain w/ sex and tampons, feels like muscles clench tight. Uses pads instead. Not currently sexually active, but when was, it was very hard to have sex. Wonders if she can do anything.     Patient's last menstrual period was 03/17/2021. The current method of family planning is abstinence and Nexplanon.  Last pap 08/28/20. Results were: LSIL w/ HRHPV positive: other (not 16, 18/45), colpo 09/15/20 w/ bx CIN I & II, s/p laser ablation of cervix on 10/24/20, needs f/u pap. H/O abnormal pap: yes  Depression screen [facility] 2/9 12/02/2020 09/01/2020 08/28/2020 06/25/2020  Decreased Interest 0 0 0 0  Down, Depressed, Hopeless 1 0 0 0  PHQ - 2 Score 1 0 0 0  Altered sleeping - 0 1 -  Tired, decreased energy - 0 1 -  Change in appetite - 0 0 -  Feeling bad or failure about yourself  - 0 0 -  Trouble concentrating - 1 0 -  Moving slowly or fidgety/restless - 0 0 -  Suicidal thoughts - 0 0 -  PHQ-9 Score - 1 2 -     GAD 7 : Generalized Anxiety Score 08/28/2020  Nervous, Anxious, on Edge 1  Control/stop worrying 0  Worry too much - different things 1  Trouble relaxing 0  Restless 1  Easily annoyed or irritable 2  Afraid - awful might happen 0  Total GAD 7 Score 5     Review of Systems:   Pertinent items are noted in HPI Denies fever/chills, dizziness, headaches, visual disturbances, fatigue, shortness of breath, chest pain, abdominal pain, vomiting, abnormal vaginal discharge/itching/odor/irritation, problems with periods, bowel movements, urination, or intercourse unless otherwise stated above.  Pertinent History Reviewed:  Reviewed past medical,surgical, social, obstetrical  and family history.  Reviewed problem list, medications and allergies. Physical Assessment:   Vitals:   04/14/21 1338  BP: 118/71  Pulse: 77  Weight: 170 lb 3.2 oz (77.2 kg)  Body mass index is 29.21 kg/m.       Physical Examination:   General appearance: alert, well appearing, and in no distress  Mental status: alert, oriented to person, place, and time  Skin: warm & dry   Cardiovascular: normal heart rate noted  Respiratory: normal respiratory effort, no distress  Abdomen: soft, non-tender   Pelvic: VULVA: normal appearing vulva with no masses, tenderness or lesions, VAGINA: normal appearing vagina with normal color and discharge, no lesions, CERVIX: normal appearing cervix without discharge or lesions  Extremities: no edema   Chaperone: Peggy Dones    No results found for this or any previous visit (from the past 24 hour(s)).  Assessment & Plan:  1) H/O CIN I&II, s/p laser ablation cervix 10/24/20> repeat pap today  2) Dyspareunia, pain w/ tampons> likely vaginismus, pelvic floor PT referral ordered  Meds: No orders of the defined types were placed in this encounter.   Orders Placed This Encounter  Procedures   Ambulatory referral to Physical Therapy    Return in about 1 year (around 04/14/2022) for Pap & physical.  Cheral Marker CNM, WHNP-BC 04/14/2021 2:08 PM

## 2021-04-23 ENCOUNTER — Ambulatory Visit (INDEPENDENT_AMBULATORY_CARE_PROVIDER_SITE_OTHER): Payer: 59 | Admitting: Psychology

## 2021-04-23 DIAGNOSIS — R69 Illness, unspecified: Secondary | ICD-10-CM | POA: Diagnosis not present

## 2021-04-23 DIAGNOSIS — F319 Bipolar disorder, unspecified: Secondary | ICD-10-CM

## 2021-04-24 ENCOUNTER — Encounter: Payer: Self-pay | Admitting: Physical Therapy

## 2021-04-24 ENCOUNTER — Other Ambulatory Visit: Payer: Self-pay

## 2021-04-24 ENCOUNTER — Ambulatory Visit: Payer: 59 | Attending: Women's Health | Admitting: Physical Therapy

## 2021-04-24 DIAGNOSIS — R252 Cramp and spasm: Secondary | ICD-10-CM | POA: Insufficient documentation

## 2021-04-24 DIAGNOSIS — R293 Abnormal posture: Secondary | ICD-10-CM | POA: Insufficient documentation

## 2021-04-24 DIAGNOSIS — M6281 Muscle weakness (generalized): Secondary | ICD-10-CM

## 2021-04-24 DIAGNOSIS — R2689 Other abnormalities of gait and mobility: Secondary | ICD-10-CM | POA: Insufficient documentation

## 2021-04-24 DIAGNOSIS — R279 Unspecified lack of coordination: Secondary | ICD-10-CM

## 2021-04-24 NOTE — Patient Instructions (Addendum)
Access Code: [email or web address] URL: [email or web address] Date: 04/24/2021 Prepared by: Stacy Gardner  Exercises Supine Diaphragmatic Breathing - 1 x daily - 7 x weekly - 3 sets - 10 reps Child's Pose Stretch - 1 x daily - 7 x weekly - 2 sets - 3 reps - 30s hold Child's Pose with Sidebending - 1 x daily - 7 x weekly - 2 sets - 3 reps - 30s hold Supine Pelvic Floor Stretch - 1 x daily - 7 x weekly - 2 sets - 3 reps - 30s hold V Sit Hip Adductor Hamstring Stretch - 1 x daily - 7 x weekly - 2 sets - 3 reps - 30s hold Butterfly Groin Stretch - 1 x daily - 7 x weekly - 2 sets - 3 reps - 30s hold Sidelying Open Book Thoracic Lumbar Rotation and Extension - 1 x daily - 7 x weekly - 2 sets - 3 reps - 30s hold    Lubrication Used for intercourse to reduce friction Avoid ones that have glycerin, warming gels, tingling gels, icing or cooling gel, scented Avoid parabens due to a preservative similar to female sex hormone May need to be reapplied once or several times during sexual activity Can be applied to both partners genitals prior to vaginal penetration to minimize friction or irritation Prevent irritation and mucosal tears that cause post coital pain and increased the risk of vaginal and urinary tract infections Oil-based lubricants cannot be used with condoms due to breaking them down.  Least likely to irritate vaginal tissue.  Plant based-lubes are safe Silicone-based lubrication are thicker and last long and used for post-menopausal women  Vaginal Lubricators Here is a list of some suggested lubricators you can use for intercourse. Use the most hypoallergenic product.  You can place on you or your partner.  Slippery Stuff ( water based) Sylk or Sliquid Natural H2O ( good  if frequent UTI's)- walmart, amazon Sliquid organics silk-(aloe and silicone based ) [employer] (www.blossom-organics.com)- (aloe based ) Coconut oil, olive oil -not good with condoms  PJur Woman Nude-  (water based) amazon Uberlube- ( silicon) [place] has an organic one Yes lubricant- (water based and has plant oil based similar to silicone) [employer] Platinum-Silicone, Target, [facility] Olive and Bee intimate cream-  www.oliveandbee.com.au [place] Things to avoid in lubricants are glycerin, warming gels, tingling gels, icing or cooling  gels, and scented gels.  Also avoid Vaseline. KY jelly, Replens, and Astroglide contain chlorhexidine which kills good bacteria(lactobacilli)  Things to avoid in the vaginal area Do not use things to irritate the vulvar area No lotions- see below Soaps you  can use :Aveeno, [place]ula, Good Clean Love cleanser if needed. Must be gentle No deodorants No douches Good to sleep without underwear to let the vaginal area to air out No scrubbing: spread the lips to let warm water rinse over labias and pat dry  Creams that can be used on the Vulva Area V [employer], walmart Vital V Wild Yam Salve Julva- [employer] Botanical Pro-Meno Wild Yam Cream Coconut oil, olive oil Cleo by [employer] labial moisturizer -Amazon,  Desert Cofield Releveum ( lidocaine) or Desert [employer] Yes Moisturizer   Pelvic Floor Vaginal dilators  Amielle Restore Vaginal Dilator Kit                                                        Vulva Tech                                                                              Restore                                                                                                                   Soul Source                                                                                    Intimate Rose                                                                                        Inspire Silicone Dilator Set                                                                                  V Well  dilator set  Milli Dilator that you pump                                                                               Berman dilator                                                                                             [place] dilators                                                                  Vaginismus Vaginal dilators                                                    Oh Nut for deep vaginal penetration limitation  Most of these dilators you can get on [employer]. The ones you are not able to do then look at the company website or smartrecharges.com    https://gentry.org/

## 2021-04-24 NOTE — Therapy (Signed)
[facility] [address], [address] [place], [place], [address] Phone: [phone]   Fax:  [phone]  Physical Therapy Evaluation  Patient Details  Name: Victoria [name] MRN: [id number] Date of Birth: [birth year] Referring Provider (PT): Cheral Marker, [place]   Encounter Date: 04/24/2021   PT End of Session - 04/24/21 1026     Visit Number 1    Authorization Type AETNA    Authorization - Visit Number 1    PT Start Time 0836    PT Stop Time 0925    PT Time Calculation (min) 49 min    Activity Tolerance Patient tolerated treatment well;Patient limited by pain    Behavior During Therapy [facility] for tasks assessed/performed             Past Medical History:  Diagnosis Date   Abnormal Papanicolaou smear of cervix with positive human papilloma virus (HPV) test 09/08/2020   Pap was LSIL +HPV:  Needs colpo per ASCCP guidelines,  CIN 3+ immediate risk is 4.3%______________   Bipolar disorder (HCC)    POTS (postural orthostatic tachycardia syndrome)     Past Surgical History:  Procedure Laterality Date   EXTRACORPOREAL CIRCULATION     LASER ABLATION CONDOLAMATA N/A 10/24/2020   Procedure: LASER ABLATION of the cervix;  Surgeon: Lazaro Arms, MD;  Location: [facility];  Service: Gynecology;  Laterality: N/A;    There were no vitals filed for this visit.    Subjective Assessment - 04/24/21 0843     Subjective Pt reports she has had pain since 25yo with any vaginal penetration. Pt no longer uses or attempts to use tampons. Pt states pain feels like intense tension and unable to relief no matter what she tries.    How long can you sit comfortably? no limitations    How long can you stand comfortably? no limitations    How long can you walk comfortably? no limitations    Patient Stated Goals to have less pain    Currently in Pain? No/denies                [facility] PT Assessment - 04/24/21 0001       Assessment    Medical Diagnosis N94.10 (ICD-10-CM) - Dyspareunia, female  N20.2 (ICD-10-CM) - Vaginismus    Referring Provider (PT) Cheral Marker, CNM    Prior Therapy none      Precautions   Precautions None      Restrictions   Weight Bearing Restrictions No      Balance Screen   Has the patient fallen in the past 6 months No    Has the patient had a decrease in activity level because of a fear of falling?  No    Is the patient reluctant to leave their home because of a fear of falling?  No      Home Environment   Living Environment Private residence    Living Arrangements Alone    Type of Home House      Prior Function   Level of Independence Independent    Vocation Full time employment    Vocation Requirements works from home, sitting mostly      Cognition   Overall Cognitive Status Within Functional Limits for tasks assessed      Sensation   Light Touch Appears Intact      Coordination   Gross Motor Movements are Fluid and Coordinated Yes    Fine Motor Movements are Fluid and  Coordinated Yes      ROM / Strength   AROM / PROM / Strength AROM;Strength      AROM   AROM Assessment Site Lumbar;Thoracic    Lumbar Flexion Limited by 25%    Lumbar Extension WFL    Lumbar - Right Side Bend limited by 25%    Lumbar - Left Side Bend WFL    Lumbar - Right Rotation WFL    Lumbar - Left [facility]ation [facility]    Thoracic Flexion limited but 25%    Thoracic Extension WFL    Thoracic - Right Side Bend limited by 25%    Thoracic - Left Side Bend [facility]    Thoracic - Right Rot[facility]    Thoracic - Left Rotation [facility]      Strength   Overall Strength Deficits    Overall Strength Comments LLE WFL    Strength Assessment Site Hip    Right/Left Hip Right;Left    Right Hip Flexion 4+/5    Right Hip Extension 4/5    Right Hip External Rotation  4+/5    Right Hip Internal Rotation 4+/5    Right Hip ABduction 4/5    Right Hip ADduction 4+/5      Flexibility   Soft Tissue Assessment /Muscle  Length yes   limited by 25% at bil hamstrings, adductors, IR/ER hips                       Objective measurements completed on examination: See above findings.     Pelvic Floor Special Questions - 04/24/21 0001     Are you Pregnant or attempting pregnancy? No    Prior Pregnancies No    Currently Sexually Active No    History of sexually transmitted disease Yes   HPV   Marinoff Scale pain prevents any attempts at intercourse    Urinary Leakage No    Fecal incontinence No    Falling out feeling (prolapse) No    External Perineal Exam slight dryness noted all else [facility]    Pelvic Floor Internal Exam patient identified and patient confirms consent for PT to perform inernal soft tissue work and muscle strength and integrity assessment    Exam Type Vaginal    Palpation no pain noted with external palpation at adductors though noted muscular tightness at proximal attachment. No pain reported by pt with external palpation for pubic bone or vulva area. Internally: 2/10 pain with pentration for exam though with deep breathing pt report improved pain levels with this. Pt found to have multiple trigger points at bulbocavernosus, ischiocavernosus, and then reported slight increase in pain with attempting to acess deeper muscles and exam halted at that time.    Strength weak squeeze, no lift   needed moderate cues to achieve 2/5 and with quick release technique demonstrated 3/5 in all quadrants   Strength # of reps 1    Strength # of seconds 2    Tone increased                      PT Education - 04/24/21 1023     Education Details Pt educated on female pelvic anatomy with handout for visual understanding, exam findings, diaphragmatic breathing, coordination technique with contract/relax of pelvic floor, vaginal dialators, lubriants, and vluva irritants, and HEP. Access Code [email or web address]    Person(s) Educated Patient    Methods Explanation;Demonstration;Tactile cues;Verbal  cues;Handout    Comprehension Verbalized understanding;Returned demonstration  PT Short Term Goals - 04/24/21 1039       PT SHORT TERM GOAL #1   Title Pt to be I with HEP    Time 6    Period Weeks    Status New    Target Date 06/05/21      PT SHORT TERM GOAL #2   Title pt to demonstrated improved pain with vaginal penetration to no more than 4/10 with size 2 dialator to equivalent sizing for progression to tolerate vaginal penetration.    Time 6    Period Weeks    Target Date 06/05/21      PT SHORT TERM GOAL #3   Title pt to demonstrate I ability to self correct and complete diaphragmatic breathing technique to promote improved pelvic relaxation    Time 6    Period Weeks    Status New    Target Date 06/05/21               PT Long Term Goals - 04/24/21 1041       PT LONG TERM GOAL #1   Title pt to be I with advanced HEP    Time 4    Period Months    Status New    Target Date 08/25/21      PT LONG TERM GOAL #2   Title pt to demonstrate improved bil hip strength to at least 5/5 globally for improved functional mobility and decreased compensatory strategies    Time 4    Period Months    Status New    Target Date 08/25/21      PT LONG TERM GOAL #3   Title pt to report no more than 2/10 pain with size 4 vaginal dialator or size equivalent for progression to tolerate vaginal penetration    Time 4    Period Months    Status New    Target Date 08/25/21                    Plan - 04/24/21 1027     Clinical Impression Statement Pt is 25 yo female presenting to clinic with long term history of pain with vaginal penetration. Pt reports she has stopped attempting to insert tampons and has no longer had vaginal intercourse since seperating from husband. She reports she having intercourse previously with husband and this was consistently painful. During evaluation pt found to have decreased breathing mechanics, bil hip weakness and flexibility,  noted increased tone in adductors, restrictions in trunk mobility, no pain externally noted. Internal vaginal exam found to have(-) q-tip test,  2/10 pain with vaginal entry for exam but cued to do x3 diaphragmatic breaths and completed at exhale which pt reported greatly improved pain she had previous experienced, increased tone in all quadrants, multiple trigger points with superficial layer of muscles, and very poor coordination of contract/relax of pelvic floor, decreased strength noted throughout though unclear if this is true strength deficit or due to poor ability to fully relax floor. Pt was able to improve coordinatoin with quick release in all quadrants. Pt would benefit from continued PT for improvement with findings and promote improved QOL.    Personal Factors and Comorbidities Time since onset of injury/illness/exacerbation    Examination-Participation Restrictions Interpersonal Relationship    Stability/Clinical Decision Making Evolving/Moderate complexity    Clinical Decision Making Moderate    Rehab Potential Good    PT Frequency 1x / week    PT Duration 12 weeks  PT Treatment/Interventions ADLs/Self Care Home Management;Functional mobility training;Therapeutic activities;Therapeutic exercise;Manual techniques;Passive range of motion;Dry needling;Taping    PT Next Visit Plan go over HEP, dialators, hip stretches, foam rolling abductors, relaxation, trigger point release    PT Home Exercise Plan Access Code [email or web address]    Consulted and Agree with Plan of Care Patient             Patient will benefit from skilled therapeutic intervention in order to improve the following deficits and impairments:  Decreased coordination, Decreased range of motion, Decreased endurance, Decreased strength, Decreased mobility, Postural dysfunction, Improper body mechanics, Impaired flexibility, Pain  Visit Diagnosis: Unspecified lack of coordination - Plan: PT plan of care cert/re-cert  Muscle  weakness (generalized) - Plan: PT plan of care cert/re-cert     Problem List Patient Active Problem List   Diagnosis Date Noted   Abnormal Pap smear of cervix 09/08/2020   Nexplanon in place 08/28/2020   Major depressive disorder, recurrent, severe without psychotic features (HCC) 07/01/2018   Postural orthostatic tachycardia syndrome 08/27/2011    Otelia Sergeant, PT 04/24/2209:45 AM   [facility] [address] [place], [place], [address] Phone: [phone]   Fax:  [phone]  Name: ROSAISELA JAMROZ MRN: [id number] Date of Birth: 05/07/1996

## 2021-04-29 ENCOUNTER — Other Ambulatory Visit: Payer: Self-pay | Admitting: Women's Health

## 2021-04-29 LAB — CYTOLOGY - PAP
Comment: NEGATIVE
Comment: NEGATIVE
Diagnosis: UNDETERMINED — AB
HPV 16: NEGATIVE
HPV 18 / 45: NEGATIVE
High risk HPV: POSITIVE — AB

## 2021-05-01 ENCOUNTER — Other Ambulatory Visit: Payer: Self-pay

## 2021-05-01 ENCOUNTER — Ambulatory Visit: Payer: 59 | Admitting: Physical Therapy

## 2021-05-01 DIAGNOSIS — M6281 Muscle weakness (generalized): Secondary | ICD-10-CM | POA: Diagnosis not present

## 2021-05-01 DIAGNOSIS — R252 Cramp and spasm: Secondary | ICD-10-CM

## 2021-05-01 DIAGNOSIS — R279 Unspecified lack of coordination: Secondary | ICD-10-CM

## 2021-05-01 DIAGNOSIS — R293 Abnormal posture: Secondary | ICD-10-CM | POA: Diagnosis not present

## 2021-05-01 DIAGNOSIS — R2689 Other abnormalities of gait and mobility: Secondary | ICD-10-CM | POA: Diagnosis not present

## 2021-05-01 NOTE — Therapy (Signed)
[name] [address], [address] [place], [place], [address] Phone: [phone]   Fax:  [phone]  Physical Therapy Treatment  Patient Details  Name: Victoria [name] MRN: [id number] Date of Birth: 02/14/1996 Referring Provider (PT): Victoria [name], [place]   Encounter Date: 05/01/2021   PT End of Session - 05/01/21 1017     Visit Number 2    Authorization Type AETNA    Authorization - Visit Number 2    PT Start Time 0930    PT Stop Time 1014    PT Time Calculation ([name]) 44 [name]    Activity Tolerance Patient tolerated treatment well;Patient limited by pain    Behavior During Therapy Victoria [name] [place] for tasks assessed/performed             Past Medical History:  Diagnosis Date   Abnormal Papanicolaou smear of cervix with positive human papilloma virus (HPV) test 09/08/2020   Pap was LSIL +HPV:  Needs colpo per ASCCP guidelines,  CIN 3+ immediate risk is 4.3%______________   Bipolar disorder (HCC)    POTS (postural orthostatic tachycardia syndrome)     Past Surgical History:  Procedure Laterality Date   EXTRACORPOREAL CIRCULATION     LASER ABLATION CONDOLAMATA N/A 10/24/2020   Procedure: LASER ABLATION of the cervix;  Surgeon: Victoria Arms, MD;  Location: [facility];  Service: Gynecology;  Laterality: N/A;    There were no vitals filed for this visit.   Subjective Assessment - 05/01/21 0935     Subjective Pt reports she has not attempted any internal penetration since last visit and therefore no pain. Pt reported she has been compliant with HEP and thinks this is helping relax pelvis.    How long can you sit comfortably? no limitations    How long can you stand comfortably? no limitations    How long can you walk comfortably? no limitations    Patient Stated Goals to have less pain    Currently in Pain? No/denies                               [facility] Adult PT Treatment/Exercise - 05/01/21 0001        Neuro Re-ed    Neuro Re-ed Details  internal vaginal with on egloved finger for retraining with contract and relax techniques with proper breathing mechanics, 3x10 completed with extra time to complete for improved technique and facilitaiton. Pt demonstrated improved ability to more fully relax with audible exhale into fist.      Exercises   Exercises Lumbar;Knee/Hip      Lumbar Exercises: Stretches   Piriformis Stretch 30 seconds;Left;Right   in supine   Other Lumbar Stretch Exercise child's pose 30s, and childs pse with trunk rotation Rt and Lt both 30s with diaphragmatic breathing    Other Lumbar Stretch Exercise deep squat 2x30s, happy baby 2x30s      Lumbar Exercises: Quadruped   Madcat/Old Horse 5 reps    Other Quadruped Lumbar Exercises needle threaders x4 Rt and Lt      Manual Therapy   Manual Therapy Myofascial release    Myofascial Release trigger release at Rt ischiocavernosus and bulbocavernosus and Lt bublocavernosus which released with gentle pressure. pt denied pain throughout.                    PT Education - 05/01/21 1009     Education Details Pt educated on  diaphragmatic breathing with stretching and with pevlic drops, proper contract/relax techniques to decrease tone with pelvic floor    Person(s) Educated Patient    Methods Explanation;Tactile cues;Verbal cues;Demonstration    Comprehension Verbalized understanding;Returned demonstration              PT Short Term Goals - 04/24/21 1039       PT SHORT TERM GOAL #1   Title Pt to be I with HEP    Time 6    Period Weeks    Status New    Target Date 06/05/21      PT SHORT TERM GOAL #2   Title pt to demonstrated improved pain with vaginal penetration to no more than 4/10 with size 2 dialator to equivalent sizing for progression to tolerate vaginal penetration.    Time 6    Period Weeks    Target Date 06/05/21      PT SHORT TERM GOAL #3   Title pt to demonstrate I ability to self correct and  complete diaphragmatic breathing technique to promote improved pelvic relaxation    Time 6    Period Weeks    Status New    Target Date 06/05/21               PT Long Term Goals - 04/24/21 1041       PT LONG TERM GOAL #1   Title pt to be I with advanced HEP    Time 4    Period Months    Status New    Target Date 08/25/21      PT LONG TERM GOAL #2   Title pt to demonstrate improved bil hip strength to at least 5/5 globally for improved functional mobility and decreased compensatory strategies    Time 4    Period Months    Status New    Target Date 08/25/21      PT LONG TERM GOAL #3   Title pt to report no more than 2/10 pain with size 4 vaginal dialator or size equivalent for progression to tolerate vaginal penetration    Time 4    Period Months    Status New    Target Date 08/25/21                   Plan - 05/01/21 1018     Clinical Impression Statement Pt presenting to clinic reporting she continues to do HEP and thinks stretching has improved flexibility slightly and she continues to attempt meditation with breathing work to improve relaxation. Pt reports she has a follow up with gynecologist for some of her test results and reported she will relay additional information as avaliable if there needs to be any changes to POC with pelvic floor therapy. Pt agreeable to internal treatment this date to further assist in feedback for relaxation, pt greatly improved with ability to relax when needed with audible exhale into fist and pt reported she felt with this feedback and deep breathing she had much lower pain levels <2-3/10 overall and feels like she is improving her understanding of pattern of when to contract/relax. Pt also found to have trigger points internally and agreed to release, denied pain throughout but did report some tension or tightness. pt reported improved relief after this. Pt also directed in stretches at end of session to assist in further  relaxation of pelvic floor as she continued to demonstrate global tightness. Pt would benefit from PT to improve flexibility, pelvic floor relaxation, decreased  pain and ability to tolerate vaginal penetration without pain.    Personal Factors and Comorbidities Time since onset of injury/illness/exacerbation    Examination-Participation Restrictions Interpersonal Relationship    Stability/Clinical Decision Making Evolving/Moderate complexity    Clinical Decision Making Moderate    Rehab Potential Good    PT Frequency 1x / week    PT Duration 12 weeks    PT Treatment/Interventions ADLs/Self Care Home Management;Functional mobility training;Therapeutic activities;Therapeutic exercise;Manual techniques;Passive range of motion;Dry needling;Taping    PT Next Visit Plan go over HEP, dialators, foam rolling abductors, relaxation, body mechanics for bowel movements and urination    PT Home Exercise Plan Access Code [email or web address]    Consulted and Agree with Plan of Care Patient             Patient will benefit from skilled therapeutic intervention in order to improve the following deficits and impairments:  Decreased coordination, Decreased range of motion, Decreased endurance, Decreased strength, Decreased mobility, Postural dysfunction, Improper body mechanics, Impaired flexibility, Pain  Visit Diagnosis: Unspecified lack of coordination  Other abnormalities of gait and mobility  Cramp and spasm     Problem List Patient Active Problem List   Diagnosis Date Noted   Abnormal Pap smear of cervix 09/08/2020   Nexplanon in place 08/28/2020   Major depressive disorder, recurrent, severe without psychotic features (HCC) 07/01/2018   Postural orthostatic tachycardia syndrome 08/27/2011    Otelia Sergeant, PT 05/01/2209:33 AM   [facility] [address] [place], [place], [address] Phone: [phone]   Fax:  [phone]  Name:  Victoria [name] MRN: [id number] Date of Birth: 1996/09/04

## 2021-05-07 ENCOUNTER — Ambulatory Visit (INDEPENDENT_AMBULATORY_CARE_PROVIDER_SITE_OTHER): Payer: 59 | Admitting: Psychology

## 2021-05-07 DIAGNOSIS — F319 Bipolar disorder, unspecified: Secondary | ICD-10-CM | POA: Diagnosis not present

## 2021-05-07 DIAGNOSIS — R69 Illness, unspecified: Secondary | ICD-10-CM | POA: Diagnosis not present

## 2021-05-08 ENCOUNTER — Ambulatory Visit: Payer: 59 | Admitting: Physical Therapy

## 2021-05-08 ENCOUNTER — Other Ambulatory Visit: Payer: Self-pay

## 2021-05-08 DIAGNOSIS — R2689 Other abnormalities of gait and mobility: Secondary | ICD-10-CM | POA: Diagnosis not present

## 2021-05-08 DIAGNOSIS — R293 Abnormal posture: Secondary | ICD-10-CM | POA: Diagnosis not present

## 2021-05-08 DIAGNOSIS — M6281 Muscle weakness (generalized): Secondary | ICD-10-CM | POA: Diagnosis not present

## 2021-05-08 DIAGNOSIS — R279 Unspecified lack of coordination: Secondary | ICD-10-CM

## 2021-05-08 DIAGNOSIS — R252 Cramp and spasm: Secondary | ICD-10-CM | POA: Diagnosis not present

## 2021-05-08 NOTE — Therapy (Signed)
[facility] [address] [place], [place], [address] Phone: [phone]   Fax:  [phone]  Physical Therapy Treatment  Patient Details  Name: Victoria [name] MRN: [id number] Date of Birth: [birth year] Referring Provider (PT): Cheral Marker, [place]   Encounter Date: 05/08/2021   PT End of Session - 05/08/21 1011     Visit Number 3    Authorization Type AETNA    Authorization - Visit Number 3    PT Start Time 0930    PT Stop Time 1009    PT Time Calculation (min) 39 min    Activity Tolerance Patient tolerated treatment well    Behavior During Therapy [facility] for tasks assessed/performed             Past Medical History:  Diagnosis Date   Abnormal Papanicolaou smear of cervix with positive human papilloma virus (HPV) test 09/08/2020   Pap was LSIL +HPV:  Needs colpo per ASCCP guidelines,  CIN 3+ immediate risk is 4.3%______________   Bipolar disorder (HCC)    POTS (postural orthostatic tachycardia syndrome)     Past Surgical History:  Procedure Laterality Date   EXTRACORPOREAL CIRCULATION     [name] ABLATION CONDOLAMATA N/A 10/24/2020   Procedure: [name] ABLATION of the cervix;  Surgeon: Lazaro Arms, MD;  Location: [facility];  Service: Gynecology;  Laterality: N/A;    There were no vitals filed for this visit.   Subjective Assessment - 05/08/21 0942     Subjective Pt reports she feels stretching has been helping but has not attempted any internal penetration, looked into purchasing dilators now that she has moved into new residence and has time.    How long can you stand comfortably? no limitations    How long can you walk comfortably? no limitations    Patient Stated Goals to have less pain    Currently in Pain? No/denies                               [facility] Adult PT Treatment/Exercise - 05/08/21 0001       Self-Care   Self-Care Other Self-Care Comments    Other Self-Care  Comments  pt educated on dilator progression and use of lubricants with this when purchased. Pt reported being on her period last week and did not attempt any tampons based on bad exeriences previously, educated on options for products to potentially decrease pain.      Neuro Re-ed    Neuro Re-ed Details  diaphragtic breathing with relaxation techniques in supine, prone, sidelying      Exercises   Exercises Lumbar;Knee/Hip      Lumbar Exercises: Stretches   Other Lumbar Stretch Exercise diaphragmatic breathing x20 in hooklying to improve pelvic relaxation;      Lumbar Exercises: Seated   Other Seated Lumbar Exercises 2x10 rt/lt and anterior/posterior pelvic tilts on pool noodle for improved mobility and relaxation at pelvic floor.    Other Seated Lumbar Exercises adductor rocks x5 each side      Manual Therapy   Manual Therapy Soft tissue mobilization    Soft tissue mobilization with use of suction cup at abdomen and anterior pelvis, and rib cage bil for decreased fasical tightness to promote improved tissue mobility in all directions                    PT Education - 05/08/21 1010  Education Details Pt continued to be educated on breathing exercises and techniques in different positions, pelvic drops with breathing, and relaxation techniques.    Person(s) Educated Patient    Methods Explanation;Demonstration;Tactile cues;Verbal cues    Comprehension Verbalized understanding;Returned demonstration              PT Short Term Goals - 04/24/21 1039       PT SHORT TERM GOAL #1   Title Pt to be I with HEP    Time 6    Period Weeks    Status New    Target Date 06/05/21      PT SHORT TERM GOAL #2   Title pt to demonstrated improved pain with vaginal penetration to no more than 4/10 with size 2 dialator to equivalent sizing for progression to tolerate vaginal penetration.    Time 6    Period Weeks    Target Date 06/05/21      PT SHORT TERM GOAL #3   Title pt to  demonstrate I ability to self correct and complete diaphragmatic breathing technique to promote improved pelvic relaxation    Time 6    Period Weeks    Status New    Target Date 06/05/21               PT Long Term Goals - 04/24/21 1041       PT LONG TERM GOAL #1   Title pt to be I with advanced HEP    Time 4    Period Months    Status New    Target Date 08/25/21      PT LONG TERM GOAL #2   Title pt to demonstrate improved bil hip strength to at least 5/5 globally for improved functional mobility and decreased compensatory strategies    Time 4    Period Months    Status New    Target Date 08/25/21      PT LONG TERM GOAL #3   Title pt to report no more than 2/10 pain with size 4 vaginal dialator or size equivalent for progression to tolerate vaginal penetration    Time 4    Period Months    Status New    Target Date 08/25/21                   Plan - 05/08/21 1011     Clinical Impression Statement Pt presenting to clinic reporting she feels her HEP is really helping relax pelvis but has not attempted any vaginal penetration herlself and being on her period last week she did not attempt tampon use. Pt reports she has looked into purchasing vaginal dilators and interesting in this but has not yet. Pt she is able to bring them in for treatment if she would like this or if she is more comfortable doing this at home that is also ok. Pt agreed. Pt requested to no perform internal feedback/treatment this date and agreeable to pelvic relaxation stretches and manual work externally. Pt session focused on abdominal, bil ribcage, pelvis, and bil back suction cup fascial release with use of suction cup. Pt reported feeling much more "loose" after this and thinks it helps with her deep breathing and ability to relax pelvic floor. During this manual work pt also instructed on breathing techniques in various positions of supine, hooklying, sidelying, and prone. Pt reported improved  mobility of ribs and less restricted after treatment. Pt also then lead in pelvic relaxation stretches of pelvic tilts on  pool noodle and adductor rocks bil. Pt would benefit from PT to improve flexibility, pelvic floor relaxation, decreased pain and ability to tolerate vaginal penetration without pain.    Personal Factors and Comorbidities Time since onset of injury/illness/exacerbation    Examination-Participation Restrictions Interpersonal Relationship    Stability/Clinical Decision Making Evolving/Moderate complexity    Clinical Decision Making Moderate    Rehab Potential Good    PT Frequency 1x / week    PT Duration 12 weeks    PT Treatment/Interventions ADLs/Self Care Home Management;Functional mobility training;Therapeutic activities;Therapeutic exercise;Manual techniques;Passive range of motion;Dry needling;Taping    PT Next Visit Plan foam rolling abductors, relaxation, body mechanics for bowel movements and urination    PT Home Exercise Plan Access Code [email or web address]    Consulted and Agree with Plan of Care Patient             Patient will benefit from skilled therapeutic intervention in order to improve the following deficits and impairments:  Decreased coordination, Decreased range of motion, Decreased endurance, Decreased strength, Decreased mobility, Postural dysfunction, Improper body mechanics, Impaired flexibility, Pain  Visit Diagnosis: Unspecified lack of coordination  Abnormal posture  Other abnormalities of gait and mobility     Problem List Patient Active Problem List   Diagnosis Date Noted   Abnormal Pap smear of cervix 09/08/2020   Nexplanon in place 08/28/2020   Major depressive disorder, recurrent, severe without psychotic features (HCC) 07/01/2018   Postural orthostatic tachycardia syndrome 08/27/2011    Otelia Sergeant, PT 05/08/2209:21 AM   [facility] [address] [place], [place],  [address] Phone: [phone]   Fax:  [phone]  Name: Victoria [name] MRN: [id number] Date of Birth: 1996-06-14

## 2021-05-12 ENCOUNTER — Telehealth: Payer: Self-pay

## 2021-05-12 ENCOUNTER — Other Ambulatory Visit: Payer: Self-pay | Admitting: *Deleted

## 2021-05-12 DIAGNOSIS — I498 Other specified cardiac arrhythmias: Secondary | ICD-10-CM

## 2021-05-12 DIAGNOSIS — G90A Postural orthostatic tachycardia syndrome (POTS): Secondary | ICD-10-CM

## 2021-05-12 MED ORDER — FLUDROCORTISONE ACETATE 0.1 MG PO TABS: 0.1000 mg | ORAL_TABLET | Freq: Two times a day (BID) | ORAL | 1 refills | Status: DC

## 2021-05-12 NOTE — Telephone Encounter (Signed)
Pharmacy changed medication resent to new pharmacy

## 2021-05-12 NOTE — Telephone Encounter (Signed)
Patient called and would like to change her pharmacy to [facility]  fludrocortisone (FLORINEF) 0.1 MG tablet   Future Pharmacy: [facility].

## 2021-05-15 ENCOUNTER — Ambulatory Visit: Payer: 59 | Admitting: Physical Therapy

## 2021-05-22 ENCOUNTER — Other Ambulatory Visit: Payer: Self-pay

## 2021-05-22 ENCOUNTER — Ambulatory Visit: Payer: 59 | Attending: Women's Health | Admitting: Physical Therapy

## 2021-05-22 DIAGNOSIS — M6281 Muscle weakness (generalized): Secondary | ICD-10-CM | POA: Insufficient documentation

## 2021-05-22 DIAGNOSIS — R279 Unspecified lack of coordination: Secondary | ICD-10-CM

## 2021-05-22 DIAGNOSIS — R2689 Other abnormalities of gait and mobility: Secondary | ICD-10-CM | POA: Diagnosis not present

## 2021-05-22 DIAGNOSIS — R252 Cramp and spasm: Secondary | ICD-10-CM | POA: Diagnosis not present

## 2021-05-22 NOTE — Therapy (Signed)
[facility] [address] [place], [place], [address] Phone: [phone]   Fax:  [phone]  Physical Therapy Treatment  Patient Details  Name: Victoria [name] MRN: [id number] Date of Birth: Feb 16, 1996 Referring Provider (PT): Cheral Marker, [place]   Encounter Date: 05/22/2021   PT End of Session - 05/22/21 1147     Visit Number 4    Date for PT Re-Evaluation 08/25/21    Authorization Type AETNA    Authorization - Visit Number 4    PT Start Time 1015    PT Stop Time 1058    PT Time Calculation (min) 43 min    Activity Tolerance Patient tolerated treatment well    Behavior During Therapy [facility] for tasks assessed/performed             Past Medical History:  Diagnosis Date   Abnormal Papanicolaou smear of cervix with positive human papilloma virus (HPV) test 09/08/2020   Pap was LSIL +HPV:  Needs colpo per ASCCP guidelines,  CIN 3+ immediate risk is 4.3%______________   Bipolar disorder (HCC)    POTS (postural orthostatic tachycardia syndrome)     Past Surgical History:  Procedure Laterality Date   EXTRACORPOREAL CIRCULATION     LASER ABLATION CONDOLAMATA N/A 10/24/2020   Procedure: LASER ABLATION of the cervix;  Surgeon: Lazaro Arms, MD;  Location: [facility];  Service: Gynecology;  Laterality: N/A;    There were no vitals filed for this visit.   Subjective Assessment - 05/22/21 1021     Subjective Pt reports stretching with HEP continues to be helping and has not attempted penetration since last visist but did order vaginal dialators.    How long can you sit comfortably? no limitations    How long can you stand comfortably? no limitations    How long can you walk comfortably? no limitations    Patient Stated Goals to have less pain    Currently in Pain? No/denies                               [facility] Adult PT Treatment/Exercise - 05/22/21 0001       Exercises   Exercises  Knee/Hip;Lumbar      Lumbar Exercises: Stretches   Pelvic Tilt 5 reps;10 seconds    Pelvic Tilt Limitations pool noodle in Rt/Lt and ant/posterior directions    Other Lumbar Stretch Exercise child's pose 30s, and childs pse with trunk rotation Rt and Lt both 30s with diaphragmatic breathing    Other Lumbar Stretch Exercise abductor foam rolling x10 each side, quad frog stretch 2x30s      Lumbar Exercises: Seated   Other Seated Lumbar Exercises adductor rocks x5 each side      Lumbar Exercises: Supine   Other Supine Lumbar Exercises foam roller for thoraic extension 2x30s      Lumbar Exercises: Quadruped   Other Quadruped Lumbar Exercises needle threaders x4 Rt and Lt      Knee/Hip Exercises: Stretches   Active Hamstring Stretch Both;1 rep;30 seconds    Hip Flexor Stretch Both;30 seconds   half kneeling   ITB Stretch Both;1 rep;30 seconds    ITB Stretch Limitations with strap                    PT Education - 05/22/21 1146     Education Details Pt educated on dialator progression as she reported she bought  these and awaiting arrival and potentially will attempt size one prior to next session. Pt also educated on all excerises for proper technique.    Person(s) Educated Patient    Methods Explanation;Demonstration;Tactile cues;Verbal cues    Comprehension Verbalized understanding;Returned demonstration              PT Short Term Goals - 04/24/21 1039       PT SHORT TERM GOAL #1   Title Pt to be I with HEP    Time 6    Period Weeks    Status New    Target Date 06/05/21      PT SHORT TERM GOAL #2   Title pt to demonstrated improved pain with vaginal penetration to no more than 4/10 with size 2 dialator to equivalent sizing for progression to tolerate vaginal penetration.    Time 6    Period Weeks    Target Date 06/05/21      PT SHORT TERM GOAL #3   Title pt to demonstrate I ability to self correct and complete diaphragmatic breathing technique to promote  improved pelvic relaxation    Time 6    Period Weeks    Status New    Target Date 06/05/21               PT Long Term Goals - 04/24/21 1041       PT LONG TERM GOAL #1   Title pt to be I with advanced HEP    Time 4    Period Months    Status New    Target Date 08/25/21      PT LONG TERM GOAL #2   Title pt to demonstrate improved bil hip strength to at least 5/5 globally for improved functional mobility and decreased compensatory strategies    Time 4    Period Months    Status New    Target Date 08/25/21      PT LONG TERM GOAL #3   Title pt to report no more than 2/10 pain with size 4 vaginal dialator or size equivalent for progression to tolerate vaginal penetration    Time 4    Period Months    Status New    Target Date 08/25/21                   Plan - 05/22/21 1148     Clinical Impression Statement Pt presents to clinic reporting she has been continuing HEP and requests to do exercises/stretching this session to improve proper relaxation of pelvis. Pt did report she is purchasing vaginal dialators and potentially will use before next session, educated on these and verbalized understanding. Pt reported feeling improvement in pelvic and hip tightness at end of session and continued to benefit from cues and hands on assist for proper technique and position and cues for breathing mechanics. Pt would benefit from PT to improve flexibility, pelvic floor relaxation, decreased pain and ability to tolerate vaginal penetration without pain.    Personal Factors and Comorbidities Time since onset of injury/illness/exacerbation    Examination-Activity Limitations Other   vaginal penetration   Examination-Participation Restrictions Interpersonal Relationship    Stability/Clinical Decision Making Evolving/Moderate complexity    Clinical Decision Making Moderate    Rehab Potential Good    PT Frequency 1x / week    PT Duration 12 weeks    PT Treatment/Interventions  ADLs/Self Care Home Management;Functional mobility training;Therapeutic activities;Therapeutic exercise;Manual techniques;Passive range of motion;Dry needling;Taping    PT Next  Visit Plan internal work for contract/relax    PT Home Exercise Plan Access Code [phone]    Consulted and Agree with Plan of Care Patient             Patient will benefit from skilled therapeutic intervention in order to improve the following deficits and impairments:  Decreased coordination, Decreased range of motion, Decreased endurance, Decreased strength, Decreased mobility, Postural dysfunction, Improper body mechanics, Impaired flexibility, Pain  Visit Diagnosis: Cramp and spasm  Unspecified lack of coordination  Other abnormalities of gait and mobility     Problem List Patient Active Problem List   Diagnosis Date Noted   Abnormal Pap smear of cervix 09/08/2020   Nexplanon in place 08/28/2020   Major depressive disorder, recurrent, severe without psychotic features (HCC) 07/01/2018   Postural orthostatic tachycardia syndrome 08/27/2011   Otelia Sergeant, PT 05/22/2210:52 AM   [facility] [address] [place], [place], [address] Phone: [phone]   Fax:  [phone]  Name: Victoria [name] MRN: [id number] Date of Birth: Aug 24, 1996

## 2021-05-28 ENCOUNTER — Ambulatory Visit (INDEPENDENT_AMBULATORY_CARE_PROVIDER_SITE_OTHER): Payer: 59 | Admitting: Psychology

## 2021-05-28 DIAGNOSIS — F319 Bipolar disorder, unspecified: Secondary | ICD-10-CM

## 2021-05-28 DIAGNOSIS — R69 Illness, unspecified: Secondary | ICD-10-CM | POA: Diagnosis not present

## 2021-05-29 ENCOUNTER — Encounter: Payer: 59 | Admitting: Women's Health

## 2021-05-29 ENCOUNTER — Ambulatory Visit: Payer: 59 | Admitting: Physical Therapy

## 2021-05-29 ENCOUNTER — Other Ambulatory Visit: Payer: Self-pay

## 2021-05-29 DIAGNOSIS — R2689 Other abnormalities of gait and mobility: Secondary | ICD-10-CM | POA: Diagnosis not present

## 2021-05-29 DIAGNOSIS — M6281 Muscle weakness (generalized): Secondary | ICD-10-CM

## 2021-05-29 DIAGNOSIS — R252 Cramp and spasm: Secondary | ICD-10-CM | POA: Diagnosis not present

## 2021-05-29 DIAGNOSIS — R279 Unspecified lack of coordination: Secondary | ICD-10-CM

## 2021-05-29 NOTE — Therapy (Signed)
[facility] [address], [address] [place], [place], [address] Phone: [phone]   Fax:  [phone]  Physical Therapy Treatment  Patient Details  Name: Victoria [name] MRN: [id number] Date of Birth: 07/31/1996 Referring Provider (PT): Cheral Marker, [place]   Encounter Date: 05/29/2021   PT End of Session - 05/29/21 1137     Visit Number 5    Date for PT Re-Evaluation 08/25/21    Authorization Type AETNA    Authorization - Visit Number 5    PT Start Time 1100    PT Stop Time 1143    PT Time Calculation (min) 43 min    Activity Tolerance Patient tolerated treatment well    Behavior During Therapy [facility] for tasks assessed/performed             Past Medical History:  Diagnosis Date   Abnormal Papanicolaou smear of cervix with positive human papilloma virus (HPV) test 09/08/2020   Pap was LSIL +HPV:  Needs colpo per ASCCP guidelines,  CIN 3+ immediate risk is 4.3%______________   Bipolar disorder (HCC)    POTS (postural orthostatic tachycardia syndrome)     Past Surgical History:  Procedure Laterality Date   EXTRACORPOREAL CIRCULATION     LASER ABLATION CONDOLAMATA N/A 10/24/2020   Procedure: LASER ABLATION of the cervix;  Surgeon: Lazaro Arms, MD;  Location: [facility];  Service: Gynecology;  Laterality: N/A;    There were no vitals filed for this visit.   Subjective Assessment - 05/29/21 1107     Subjective Pt reports she has now gotten vaginal dialators, attempted at home with size 1 and reports annoying pain not bad. Pt then started period and did not want to attempt again until this is done.    How long can you sit comfortably? no limitations    How long can you stand comfortably? no limitations    How long can you walk comfortably? no limitations    Patient Stated Goals to have less pain    Currently in Pain? No/denies                               [facility] Adult PT Treatment/Exercise -  05/29/21 0001       Exercises   Exercises Knee/Hip;Lumbar      Lumbar Exercises: Stretches   Other Lumbar Stretch Exercise hip hinge stretch 2x30s with toes forward and toes inward 2x30s    Other Lumbar Stretch Exercise thoracic opening foam roller 2x45s; thoracic opening with trunk rotation, happy baby 2x30s      Lumbar Exercises: Quadruped   Other Quadruped Lumbar Exercises needle threaders x4 Rt and Lt      Knee/Hip Exercises: Stretches   Active Hamstring Stretch Both;1 rep;30 seconds    Hip Flexor Stretch Both;30 seconds   half kneeling   Other Knee/Hip Stretches adductor fall out stretch in hooklying 2x45s      Manual Therapy   Manual Therapy Soft tissue mobilization    Soft tissue mobilization manual work at lower abdomen for improved tissue mobility in all directions and decrease tightness at pelvis                    PT Education - 05/29/21 1135     Education Details Pt educated on all exercises and proper technique and progression with dialators, and different pelvic floor relaxation videos.    Person(s) Educated Patient  Methods Explanation;Demonstration;Tactile cues;Verbal cues    Comprehension Verbalized understanding;Returned demonstration              PT Short Term Goals - 04/24/21 1039       PT SHORT TERM GOAL #1   Title Pt to be I with HEP    Time 6    Period Weeks    Status New    Target Date 06/05/21      PT SHORT TERM GOAL #2   Title pt to demonstrated improved pain with vaginal penetration to no more than 4/10 with size 2 dialator to equivalent sizing for progression to tolerate vaginal penetration.    Time 6    Period Weeks    Target Date 06/05/21      PT SHORT TERM GOAL #3   Title pt to demonstrate I ability to self correct and complete diaphragmatic breathing technique to promote improved pelvic relaxation    Time 6    Period Weeks    Status New    Target Date 06/05/21               PT Long Term Goals - 04/24/21  1041       PT LONG TERM GOAL #1   Title pt to be I with advanced HEP    Time 4    Period Months    Status New    Target Date 08/25/21      PT LONG TERM GOAL #2   Title pt to demonstrate improved bil hip strength to at least 5/5 globally for improved functional mobility and decreased compensatory strategies    Time 4    Period Months    Status New    Target Date 08/25/21      PT LONG TERM GOAL #3   Title pt to report no more than 2/10 pain with size 4 vaginal dialator or size equivalent for progression to tolerate vaginal penetration    Time 4    Period Months    Status New    Target Date 08/25/21                   Plan - 05/29/21 1144     Clinical Impression Statement Pt presenting to clinic reporting she has starting using vaginal dialators at home with size 1 and no more than 3-4/10 pain with mobility in all directions and with penetration. Pt educated on progression as able. Pt also educated on additional pelvic floor relaxation videos as she had questions about this. Pt currently on her period and requested to not do internal work this date. Pt session focused on hip stretching and spineal opening stretching for improved mobility, cues throughout for technique and to unclench glutes and jaw to assist in further relaxation. Pt tolerated session well denied additional questions and would benefit from continued PT improve flexibility, pelvic floor relaxation, decreased pain and ability to tolerate vaginal penetration without pain.    Personal Factors and Comorbidities Time since onset of injury/illness/exacerbation    Examination-Activity Limitations Other   vaginal penetration   Examination-Participation Restrictions Interpersonal Relationship    Stability/Clinical Decision Making Evolving/Moderate complexity    Clinical Decision Making Moderate    Rehab Potential Good    PT Frequency 1x / week    PT Duration 12 weeks    PT Treatment/Interventions ADLs/Self Care Home  Management;Functional mobility training;Therapeutic activities;Therapeutic exercise;Manual techniques;Passive range of motion;Dry needling;Taping    PT Next Visit Plan internal work for contract/relax  PT Home Exercise Plan Access Code [email or web address]    Consulted and Agree with Plan of Care Patient             Patient will benefit from skilled therapeutic intervention in order to improve the following deficits and impairments:  Decreased coordination, Decreased range of motion, Decreased endurance, Decreased strength, Decreased mobility, Postural dysfunction, Improper body mechanics, Impaired flexibility, Pain  Visit Diagnosis: Lack of coordination  Muscle weakness (generalized)  Cramp and spasm     Problem List Patient Active Problem List   Diagnosis Date Noted   Abnormal Pap smear of cervix 09/08/2020   Nexplanon in place 08/28/2020   Major depressive disorder, recurrent, severe without psychotic features (HCC) 07/01/2018   Postural orthostatic tachycardia syndrome 08/27/2011    Otelia Sergeant, PT 05/29/2210:48 AM   [facility] [address] [place], [place], [address] Phone: [phone]   Fax:  [phone]  Name: VERENA SHAWGO MRN: [id number] Date of Birth: Feb 10, 1996

## 2021-06-01 ENCOUNTER — Encounter: Payer: Self-pay | Admitting: Women's Health

## 2021-06-01 ENCOUNTER — Ambulatory Visit (INDEPENDENT_AMBULATORY_CARE_PROVIDER_SITE_OTHER): Payer: 59 | Admitting: Women's Health

## 2021-06-01 ENCOUNTER — Ambulatory Visit: Payer: Managed Care, Other (non HMO) | Admitting: Internal Medicine

## 2021-06-01 ENCOUNTER — Other Ambulatory Visit (HOSPITAL_COMMUNITY)
Admission: RE | Admit: 2021-06-01 | Discharge: 2021-06-01 | Disposition: A | Discharge status: Home / Self Care | Payer: 59 | Source: Ambulatory Visit | Attending: Women's Health | Admitting: Women's Health

## 2021-06-01 ENCOUNTER — Other Ambulatory Visit: Payer: Self-pay

## 2021-06-01 VITALS — BP 120/70 | HR 94 | Wt 170.0 lb

## 2021-06-01 DIAGNOSIS — R87810 Cervical high risk human papillomavirus (HPV) DNA test positive: Secondary | ICD-10-CM | POA: Diagnosis not present

## 2021-06-01 DIAGNOSIS — R87610 Atypical squamous cells of undetermined significance on cytologic smear of cervix (ASC-US): Secondary | ICD-10-CM | POA: Insufficient documentation

## 2021-06-01 DIAGNOSIS — R69 Illness, unspecified: Secondary | ICD-10-CM | POA: Diagnosis not present

## 2021-06-01 DIAGNOSIS — N870 Mild cervical dysplasia: Secondary | ICD-10-CM | POA: Diagnosis not present

## 2021-06-01 NOTE — Patient Instructions (Signed)
[email or web address]  Colposcopy, Care After This sheet gives you information about how to care for yourself after your procedure. Your health care provider may also give you more specific instructions. If you have problems or questions, contact your health careprovider. What can I expect after the procedure? If you had a colposcopy without a biopsy, you can expect to feel fine right away after your procedure. However, you may have some spotting of blood for afew days. You can return to your normal activities. If you had a colposcopy with a biopsy, it is common after the procedure to have: Soreness and mild pain. These may last for a few days. Light-headedness. Mild vaginal bleeding or discharge that is dark-colored and grainy. This may last for a few days. The discharge may be caused by a liquid (solution) that was used during the procedure. You may need to wear a sanitary pad during this time. Spotting of blood for at least 48 hours after the procedure. Follow these instructions at home: Medicines Take over-the-counter and prescription medicines only as told by your health care provider. Talk with your health care provider about what type of over-the-counter pain medicine and prescription medicine you can start to take again. It is especially important to talk with your health care provider if you take blood thinners. Activity Limit your physical activity for the first day after your procedure as told by your health care provider. Avoid using douche products, using tampons, or having sex for at least 3 days after the procedure or for as long as told. Return to your normal activities as told by your health care provider. Ask your health care provider what activities are safe for you. General instructions  Drink enough fluid to keep your urine pale yellow. Ask your health care provider if you may take baths, swim, or use a hot tub. You may take showers. If you use  birth control (contraception), continue to use it. Keep all follow-up visits as told by your health care provider. This is important.  Contact a health care provider if: You develop a skin rash. Get help right away if: You bleed a lot from your vagina or pass blood clots. This includes using more than one sanitary pad each hour for 2 hours in a row. You have a fever or chills. You have vaginal discharge that is abnormal, is yellow in color, or smells bad. This could be a sign of infection. You have severe pain or cramps in your lower abdomen that do not go away with medicine. You faint. Summary If you had a colposcopy without a biopsy, you can expect to feel fine right away, but you may have some spotting of blood for a few days. You can return to your normal activities. If you had a colposcopy with a biopsy, it is common to have mild pain for a few days and spotting for 48 hours after the procedure. Avoid using douche products, using tampons, and having sex for at least 3 days after the procedure or for as long as told by your health care provider. Get help right away if you have heavy bleeding, severe pain, or signs of infection. This information is not intended to replace advice given to you by your health care provider. Make sure you discuss any questions you have with your healthcare provider. Document Revised: 09/26/2019 Document Reviewed: 09/26/2019 Elsevier Patient Education  2022 Elsevier Inc.  

## 2021-06-01 NOTE — Progress Notes (Signed)
   COLPOSCOPY PROCEDURE NOTE Patient name: Victoria [name] MRN [id number]  Date of birth: 1996/06/27 Subjective Findings:   Victoria [name] is a 25 y.o. G0P0000 Caucasian female being seen today for a colposcopy. Indication: Abnormal pap on 04/14/21: ASCUS w/ HRHPV positive: other (not 16, 18/45)  Prior cytology:  Date Result Procedure  08/28/2020 LSIL w/ HRHPV positive: other (not 16, 18/45) Colposcopy: CIN I&2>laser ablation 10/24/20              Patient's last menstrual period was 05/25/2021. Contraception: abstinence and Nexplanon. Menopausal: no. Hysterectomy: no.   Smoker: no. Immunocompromised: no.   The risks and benefits were explained and informed consent was obtained, and written copy is in chart. Pertinent History Reviewed:   Reviewed past medical,surgical, social, obstetrical and family history.  Reviewed problem list, medications and allergies. Objective Findings & Procedure:   Vitals:   06/01/21 1347  BP: 120/70  Pulse: 94  Weight: 170 lb (77.1 kg)  Body mass index is 29.18 kg/m.  No results found for this or any previous visit (from the past 24 hour(s)).   Time out was performed.  Speculum placed in the vagina, cervix fully visualized. SCJ: fully visualized. Cervix swabbed x 3 with acetic acid.  Acetowhitening present: Yes Cervix: no visible lesions, no mosaicism, no punctation, no abnormal vasculature, and acetowhite lesion(s) noted at 12 o'clock. Cervical biopsies taken at 12 o'clock and Hemostasis achieved with Monsel's solution. Vagina: vaginal colposcopy not performed Vulva: vulvar colposcopy not performed  Specimens: 1  Complications: none  Chaperone: Faith Rogue    Colposcopic Impression & Plan:   Colposcopy findings consistent with LSIL Plan: Post biopsy instructions given, Will notify patient of results when back, and Will base plan of care on pathology results and ASCCP guidelines  Return in about 1 year (around 06/01/2022) for Pap &  physical.  Cheral Marker CNM, WHNP-BC 06/01/2021 2:25 PM

## 2021-06-03 ENCOUNTER — Telehealth: Payer: Self-pay | Admitting: Obstetrics & Gynecology

## 2021-06-03 LAB — SURGICAL PATHOLOGY

## 2021-06-03 NOTE — Telephone Encounter (Signed)
See telephone message note.

## 2021-06-05 ENCOUNTER — Encounter: Payer: 59 | Admitting: Physical Therapy

## 2021-06-11 ENCOUNTER — Ambulatory Visit (INDEPENDENT_AMBULATORY_CARE_PROVIDER_SITE_OTHER): Payer: 59 | Admitting: Psychology

## 2021-06-11 DIAGNOSIS — F319 Bipolar disorder, unspecified: Secondary | ICD-10-CM

## 2021-06-11 DIAGNOSIS — R69 Illness, unspecified: Secondary | ICD-10-CM | POA: Diagnosis not present

## 2021-06-12 ENCOUNTER — Encounter: Payer: Self-pay | Admitting: Physical Therapy

## 2021-06-12 ENCOUNTER — Ambulatory Visit: Payer: 59 | Attending: Women's Health | Admitting: Physical Therapy

## 2021-06-12 ENCOUNTER — Other Ambulatory Visit: Payer: Self-pay

## 2021-06-12 DIAGNOSIS — R252 Cramp and spasm: Secondary | ICD-10-CM

## 2021-06-12 DIAGNOSIS — R2689 Other abnormalities of gait and mobility: Secondary | ICD-10-CM | POA: Diagnosis not present

## 2021-06-12 DIAGNOSIS — R279 Unspecified lack of coordination: Secondary | ICD-10-CM | POA: Insufficient documentation

## 2021-06-12 DIAGNOSIS — M6281 Muscle weakness (generalized): Secondary | ICD-10-CM | POA: Insufficient documentation

## 2021-06-12 DIAGNOSIS — R293 Abnormal posture: Secondary | ICD-10-CM | POA: Insufficient documentation

## 2021-06-12 NOTE — Therapy (Signed)
[facility] [address], [address] [place], [place], [address] Phone: [phone]   Fax:  [phone]  Physical Therapy Treatment  Patient Details  Name: Victoria [name] MRN: [id number] Date of Birth: [birth year] Referring Provider (PT): Cheral Marker, [place]   Encounter Date: 06/12/2021   PT End of Session - 06/12/21 1013     Visit Number 6    Date for PT Re-Evaluation 08/25/21    Authorization Type AETNA    Authorization - Visit Number 6    PT Start Time 0935    PT Stop Time 1013    PT Time Calculation (min) 38 min    Activity Tolerance Patient tolerated treatment well    Behavior During Therapy [facility] for tasks assessed/performed             Past Medical History:  Diagnosis Date   Abnormal Papanicolaou smear of cervix with positive human papilloma virus (HPV) test 09/08/2020   Pap was LSIL +HPV:  Needs colpo per ASCCP guidelines,  CIN 3+ immediate risk is 4.3%______________   Bipolar disorder (HCC)    POTS (postural orthostatic tachycardia syndrome)     Past Surgical History:  Procedure Laterality Date   EXTRACORPOREAL CIRCULATION     LASER ABLATION CONDOLAMATA N/A 10/24/2020   Procedure: LASER ABLATION of the cervix;  Surgeon: Lazaro Arms, MD;  Location: [facility];  Service: Gynecology;  Laterality: N/A;    There were no vitals filed for this visit.   Subjective Assessment - 06/12/21 0939     Subjective Pt reports she has been using vaginal dilators at home, starting with size 1 and progressing to 2, then with prepping with size 2 able to attempt size 3.    How long can you sit comfortably? no limitations    How long can you stand comfortably? no limitations    How long can you walk comfortably? no limitations    Patient Stated Goals to have less pain    Currently in Pain? No/denies                            Pelvic Floor Special Questions - 06/12/21 0001     Pelvic Floor Internal Exam  patient identified and patient confirms consent for PT to perform inernal soft tissue work and muscle strength and integrity assessment    Exam Type Vaginal    Palpation no pain thorughout treatment or with insertion of gloved finger.    Strength fair squeeze, definite lift    Strength # of reps 4    Strength # of seconds 4    Tone improving               [facility] Adult PT Treatment/Exercise - 06/12/21 0001       Self-Care   Self-Care Other Self-Care Comments    Other Self-Care Comments  Pt educated on progressions with vaginal dilators at home and reported understanding and agreeable.      Manual Therapy   Manual Therapy Myofascial release;Internal Pelvic Floor    Internal Pelvic Floor manual trigger point release completed during session at internal pelvic treatment at pt's consent for vaginal treatment. Pt had multiple trigger points at mostly Lt side of pelvis at bulbocavernosus, ischiocavernosus, and Lt pubococcygeus. minor trigger points noted at Rt side of pelvis at bulbocavernosus and ischiocavernosus. Pt denied pain but reported feeling "some tightness" with that relieved with treatment.  PT Education - 06/12/21 1013     Education Details Pt educated on progression with vaginal dilatiors for home and to continued pelvic floor relasxations.    Person(s) Educated Patient    Methods Explanation;Demonstration;Verbal cues;Tactile cues    Comprehension Verbalized understanding;Returned demonstration              PT Short Term Goals - 06/12/21 1023       PT SHORT TERM GOAL #1   Title Pt to be I with HEP    Time 6    Period Weeks    Status On-going    Target Date 06/05/21      PT SHORT TERM GOAL #2   Title pt to demonstrated improved pain with vaginal penetration to no more than 4/10 with size 2 dialator to equivalent sizing for progression to tolerate vaginal penetration.    Time 6    Period Weeks    Status Achieved    Target Date  06/05/21      PT SHORT TERM GOAL #3   Title pt to demonstrate I ability to self correct and complete diaphragmatic breathing technique to promote improved pelvic relaxation    Time 6    Period Weeks    Status Achieved    Target Date 06/05/21               PT Long Term Goals - 04/24/21 1041       PT LONG TERM GOAL #1   Title pt to be I with advanced HEP    Time 4    Period Months    Status New    Target Date 08/25/21      PT LONG TERM GOAL #2   Title pt to demonstrate improved bil hip strength to at least 5/5 globally for improved functional mobility and decreased compensatory strategies    Time 4    Period Months    Status New    Target Date 08/25/21      PT LONG TERM GOAL #3   Title pt to report no more than 2/10 pain with size 4 vaginal dialator or size equivalent for progression to tolerate vaginal penetration    Time 4    Period Months    Status New    Target Date 08/25/21                   Plan - 06/12/21 1014     Clinical Impression Statement Pt reporting this date she has been completing HEP with vaginal dilators and now able to use size three after progressing with size 1>2>then 3 during same session at home. Pt educated as able and comfortable to progress starting with size two. Pt educated not to continue if pain increases to 3-4/10 with penetration of dilator or mobility internally. Pt agreed. pt session then focused on internal pelvic treatment and manual work. Pt demontrated greatly improved tolerance to one lubricated gloved finger inserted into vaginal canal for treatment and noted decreased tone in all quadrants. Pt denied pain and agreeable to gentle stretching and manual work with identified trigger points in noted quadrants, see manual note for details. Pt did not noted increased trigger points at Lt>Rt and continues to have hypertonicity throughout pelvis but is improving compared to previous internal assessment. Pt benefits from cues for  diaphragmatic breathing to relax pelvis and tolerated session well and reported feeling less tight post trigger point release. Pt also reported feeling like she is progressing and pleased with progress  so far with PT but reports she needs more sessions to further improve symptoms. Pt also demonstrated improved strength at 3/5 in all quadrants without cues and improved endurance and coordination at pelvic floor compared to previous session with internal work. Pt tolerated session well denied additional questions and would benefit from continued PT improve flexibility, pelvic floor relaxation, decreased pain and ability to tolerate vaginal penetration without pain.    Personal Factors and Comorbidities Time since onset of injury/illness/exacerbation    Examination-Activity Limitations Other   vaginal penetration   Examination-Participation Restrictions Interpersonal Relationship    Stability/Clinical Decision Making Evolving/Moderate complexity    Clinical Decision Making Moderate    Rehab Potential Good    PT Frequency 1x / week    PT Duration 12 weeks    PT Treatment/Interventions ADLs/Self Care Home Management;Functional mobility training;Therapeutic activities;Therapeutic exercise;Manual techniques;Passive range of motion;Dry needling;Taping;Patient/family education;Neuromuscular re-education;Energy conservation    PT Next Visit Plan further relaxation training and breath work with pelvic drop and stretching    PT Home Exercise Plan Access Code [email or web address]    Consulted and Agree with Plan of Care Patient             Patient will benefit from skilled therapeutic intervention in order to improve the following deficits and impairments:  Decreased coordination, Decreased range of motion, Decreased endurance, Decreased strength, Decreased mobility, Postural dysfunction, Improper body mechanics, Impaired flexibility, Pain  Visit Diagnosis: Cramp and spasm  Muscle weakness (generalized)  Lack of  coordination     Problem List Patient Active Problem List   Diagnosis Date Noted   Abnormal Pap smear of cervix 09/08/2020   Nexplanon in place 08/28/2020   Major depressive disorder, recurrent, severe without psychotic features (HCC) 07/01/2018   Postural orthostatic tachycardia syndrome 08/27/2011    Otelia Sergeant, PT, DPT 06/12/2209:24 AM    [facility] [address] [place], [place], [address] Phone: [phone]   Fax:  [phone]  Name: Victoria [name] MRN: [id number] Date of Birth: 02/12/1996

## 2021-06-19 ENCOUNTER — Other Ambulatory Visit: Payer: Self-pay

## 2021-06-19 ENCOUNTER — Ambulatory Visit: Payer: 59 | Admitting: Physical Therapy

## 2021-06-19 DIAGNOSIS — R279 Unspecified lack of coordination: Secondary | ICD-10-CM | POA: Diagnosis not present

## 2021-06-19 DIAGNOSIS — M6281 Muscle weakness (generalized): Secondary | ICD-10-CM | POA: Diagnosis not present

## 2021-06-19 DIAGNOSIS — R252 Cramp and spasm: Secondary | ICD-10-CM

## 2021-06-19 DIAGNOSIS — R293 Abnormal posture: Secondary | ICD-10-CM | POA: Diagnosis not present

## 2021-06-19 DIAGNOSIS — R2689 Other abnormalities of gait and mobility: Secondary | ICD-10-CM | POA: Diagnosis not present

## 2021-06-19 NOTE — Therapy (Signed)
[facility] [address] [place], [place], [address] Phone: [phone]   Fax:  [phone]  Physical Therapy Treatment  Patient Details  Name: Victoria [name] MRN: [id number] Date of Birth: [birth year] Referring Provider (PT): Cheral Marker, [place]   Encounter Date: 06/19/2021   PT End of Session - 06/19/21 1011     Visit Number 7    Date for PT Re-Evaluation 08/25/21    Authorization Type AETNA    Authorization - Visit Number 7    PT Start Time 0934    PT Stop Time 1010    PT Time Calculation (min) 36 min    Activity Tolerance Patient tolerated treatment well    Behavior During Therapy [facility] for tasks assessed/performed             Past Medical History:  Diagnosis Date   Abnormal Papanicolaou smear of cervix with positive human papilloma virus (HPV) test 09/08/2020   Pap was LSIL +HPV:  Needs colpo per ASCCP guidelines,  CIN 3+ immediate risk is 4.3%______________   Bipolar disorder (HCC)    POTS (postural orthostatic tachycardia syndrome)     Past Surgical History:  Procedure Laterality Date   EXTRACORPOREAL CIRCULATION     LASER ABLATION CONDOLAMATA N/A 10/24/2020   Procedure: LASER ABLATION of the cervix;  Surgeon: Lazaro Arms, MD;  Location: [facility];  Service: Gynecology;  Laterality: N/A;    There were no vitals filed for this visit.   Subjective Assessment - 06/19/21 0937     Subjective Pt reports she has been using vaginal dilators at home now starting with size 2 and progressing to size 3 but unable to start with size 3 due to pain.    How long can you sit comfortably? no limitations    How long can you stand comfortably? no limitations    How long can you walk comfortably? no limitations    Patient Stated Goals to have less pain    Currently in Pain? No/denies                               [facility] Adult PT Treatment/Exercise - 06/19/21 0001       Exercises    Exercises Knee/Hip;Lumbar      Lumbar Exercises: Stretches   Piriformis Stretch Right;Left;1 rep;30 seconds      Lumbar Exercises: Seated   Other Seated Lumbar Exercises adductor rocks 5x10s each side    Other Seated Lumbar Exercises pool noodle pelvic tilt stretching anterior/posterior and Lt/Rt x10 each for pelvic relaxation      Lumbar Exercises: Supine   Bridge 20 reps    Bridge Limitations with abduction black band    Other Supine Lumbar Exercises thoracic opening on foam roller with diaphragmatic breathing 2x45s; thoracic rotation stretch 2x30s      Manual Therapy   Manual Therapy Soft tissue mobilization    Manual therapy comments manual work at coccyx as pt noted slight discomfort with sitting at this area and mild tightness noted at Rt side and noted improvment with mobilty post this treatment and pt reported no longer feeling this discomfort .                     PT Education - 06/19/21 1009     Education Details Pt educated on continued use of dilators for home and safe progressions and continued use of lubricant with  sample given to decrease any pain with penetration as well.    Person(s) Educated Patient    Methods Explanation;Demonstration;Tactile cues;Verbal cues    Comprehension Verbalized understanding;Returned demonstration              PT Short Term Goals - 06/12/21 1023       PT SHORT TERM GOAL #1   Title Pt to be I with HEP    Time 6    Period Weeks    Status On-going    Target Date 06/05/21      PT SHORT TERM GOAL #2   Title pt to demonstrated improved pain with vaginal penetration to no more than 4/10 with size 2 dialator to equivalent sizing for progression to tolerate vaginal penetration.    Time 6    Period Weeks    Status Achieved    Target Date 06/05/21      PT SHORT TERM GOAL #3   Title pt to demonstrate I ability to self correct and complete diaphragmatic breathing technique to promote improved pelvic relaxation    Time 6     Period Weeks    Status Achieved    Target Date 06/05/21               PT Long Term Goals - 04/24/21 1041       PT LONG TERM GOAL #1   Title pt to be I with advanced HEP    Time 4    Period Months    Status New    Target Date 08/25/21      PT LONG TERM GOAL #2   Title pt to demonstrate improved bil hip strength to at least 5/5 globally for improved functional mobility and decreased compensatory strategies    Time 4    Period Months    Status New    Target Date 08/25/21      PT LONG TERM GOAL #3   Title pt to report no more than 2/10 pain with size 4 vaginal dialator or size equivalent for progression to tolerate vaginal penetration    Time 4    Period Months    Status New    Target Date 08/25/21                   Plan - 06/19/21 1013     Clinical Impression Statement Pt presenting to clinic reporting she has continued to progress with vaginal dilators at home and now starting with size two and progressing to size 3 but unable to start with 3 due to pain with penetration and deeper mobility as well. Pt directed in bil hip stretching, pelvic stretching, manual mobility at coccyx with mild tightness noted here as well,  conintued cues for breathing mechanics during exercises and to do pelvic drops when needed for further stretching and relaxation. Pt continues to demonstrate tightness and difficulty with relaxation and proper coordination of pelvic floor/core contract/relax, continues to have pain with vaginal penetration, internal deeper pain with use of dilators at home and has continued to have trigger points with internal work when done. Pt this session focused on external flexibility and strength training. Pt tolerated session well denied additional questions and would benefit from continued PT improve flexibility, pelvic floor relaxation, decreased pain and ability to tolerate vaginal penetration without pain.    Personal Factors and Comorbidities Time since  onset of injury/illness/exacerbation    Examination-Activity Limitations Other    Examination-Participation Restrictions Interpersonal Relationship    Stability/Clinical Decision  Making Evolving/Moderate complexity    Clinical Decision Making Moderate    Rehab Potential Good    PT Frequency 1x / week    PT Duration 12 weeks    PT Treatment/Interventions ADLs/Self Care Home Management;Functional mobility training;Therapeutic activities;Therapeutic exercise;Manual techniques;Passive range of motion;Dry needling;Taping;Patient/family education;Neuromuscular re-education;Energy conservation    PT Next Visit Plan further relaxation training and breath work with pelvic drop and stretching    PT Home Exercise Plan Access Code [email or web address]    Consulted and Agree with Plan of Care Patient             Patient will benefit from skilled therapeutic intervention in order to improve the following deficits and impairments:  Decreased coordination, Decreased range of motion, Decreased endurance, Decreased strength, Decreased mobility, Postural dysfunction, Improper body mechanics, Impaired flexibility, Pain  Visit Diagnosis: Cramp and spasm  Muscle weakness (generalized)  Lack of coordination     Problem List Patient Active Problem List   Diagnosis Date Noted   Abnormal Pap smear of cervix 09/08/2020   Nexplanon in place 08/28/2020   Major depressive disorder, recurrent, severe without psychotic features (HCC) 07/01/2018   Postural orthostatic tachycardia syndrome 08/27/2011    Otelia Sergeant, PT, DPT 06/19/2209:17 AM    [facility] [address] [place], [place], [address] Phone: [phone]   Fax:  [phone]  Name: SANIYA TRANCHINA MRN: [id number] Date of Birth: May 03, 1996

## 2021-06-22 ENCOUNTER — Other Ambulatory Visit: Payer: Self-pay

## 2021-06-22 ENCOUNTER — Encounter: Payer: Self-pay | Admitting: Internal Medicine

## 2021-06-22 ENCOUNTER — Ambulatory Visit (INDEPENDENT_AMBULATORY_CARE_PROVIDER_SITE_OTHER): Payer: 59 | Admitting: Internal Medicine

## 2021-06-22 VITALS — BP 114/67 | HR 77 | Temp 97.4°F | Resp 18 | Ht 64.0 in | Wt 171.0 lb

## 2021-06-22 DIAGNOSIS — R519 Headache, unspecified: Secondary | ICD-10-CM | POA: Diagnosis not present

## 2021-06-22 DIAGNOSIS — F419 Anxiety disorder, unspecified: Secondary | ICD-10-CM

## 2021-06-22 DIAGNOSIS — R079 Chest pain, unspecified: Secondary | ICD-10-CM

## 2021-06-22 DIAGNOSIS — R69 Illness, unspecified: Secondary | ICD-10-CM | POA: Diagnosis not present

## 2021-06-22 DIAGNOSIS — Z23 Encounter for immunization: Secondary | ICD-10-CM | POA: Diagnosis not present

## 2021-06-22 DIAGNOSIS — G90A Postural orthostatic tachycardia syndrome (POTS): Secondary | ICD-10-CM

## 2021-06-22 DIAGNOSIS — F319 Bipolar disorder, unspecified: Secondary | ICD-10-CM | POA: Insufficient documentation

## 2021-06-22 DIAGNOSIS — F418 Other specified anxiety disorders: Secondary | ICD-10-CM

## 2021-06-22 DIAGNOSIS — G8929 Other chronic pain: Secondary | ICD-10-CM | POA: Diagnosis not present

## 2021-06-22 DIAGNOSIS — I498 Other specified cardiac arrhythmias: Secondary | ICD-10-CM | POA: Diagnosis not present

## 2021-06-22 MED ORDER — SERTRALINE HCL 50 MG PO TABS: 50.0000 mg | ORAL_TABLET | Freq: Every day | ORAL | 3 refills | Status: DC
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
## 2021-06-22 NOTE — Assessment & Plan Note (Signed)
Takes Fludrocortisone 0.1 mg BID Advised to avoid sudden changes in posture CMP wnl I do not think her chest pain is due to POTS, but will refer to Cardiology as per patient request.

## 2021-06-22 NOTE — Assessment & Plan Note (Addendum)
Could be related to anxiety/stress No other symptoms of migraine, but she has to work in front of screen for her work. Needs Ophthalmology evaluation for likely correctional error, can contribute to headache

## 2021-06-22 NOTE — Assessment & Plan Note (Signed)
Started Zoloft 50 mg QD Referred to [facility] therapist

## 2021-06-22 NOTE — Assessment & Plan Note (Signed)
EKG: Sinus rhythm.  No signs of active ischemia. Unlikely to be cardiac etiology, likely related to anxiety - reassured for now

## 2021-06-22 NOTE — Patient Instructions (Addendum)
Please start taking Zoloft as prescribed.  You are being referred to Behavioral health therapist.  Bonita Quin are being referred to Cardiologist for evaluation of POTS.

## 2021-06-25 ENCOUNTER — Ambulatory Visit: Payer: 59 | Admitting: Psychology

## 2021-06-26 ENCOUNTER — Other Ambulatory Visit: Payer: Self-pay

## 2021-06-26 ENCOUNTER — Ambulatory Visit: Payer: 59 | Admitting: Physical Therapy

## 2021-06-26 ENCOUNTER — Encounter: Payer: Self-pay | Admitting: Physical Therapy

## 2021-06-26 DIAGNOSIS — R279 Unspecified lack of coordination: Secondary | ICD-10-CM

## 2021-06-26 DIAGNOSIS — R293 Abnormal posture: Secondary | ICD-10-CM | POA: Diagnosis not present

## 2021-06-26 DIAGNOSIS — M6281 Muscle weakness (generalized): Secondary | ICD-10-CM

## 2021-06-26 DIAGNOSIS — R2689 Other abnormalities of gait and mobility: Secondary | ICD-10-CM | POA: Diagnosis not present

## 2021-06-26 DIAGNOSIS — R252 Cramp and spasm: Secondary | ICD-10-CM | POA: Diagnosis not present

## 2021-06-26 NOTE — Therapy (Signed)
[facility] [address] [place], [place], [address] Phone: [phone]   Fax:  [phone]  Physical Therapy Treatment  Patient Details  Name: Victoria [name] MRN: [id number] Date of Birth: Nov 28, 1995 Referring Provider (PT): Cheral Marker, [place]   Encounter Date: 06/26/2021   PT End of Session - 06/26/21 1017     Visit Number 8    Date for PT Re-Evaluation 08/25/21    Authorization Type AETNA    Authorization - Visit Number 8    PT Start Time 0936    PT Stop Time 1015    PT Time Calculation (min) 39 min    Activity Tolerance Patient tolerated treatment well;Patient limited by fatigue    Behavior During Therapy [facility] for tasks assessed/performed             Past Medical History:  Diagnosis Date   Abnormal Papanicolaou smear of cervix with positive human papilloma virus (HPV) test 09/08/2020   Pap was LSIL +HPV:  Needs colpo per ASCCP guidelines,  CIN 3+ immediate risk is 4.3%______________   Bipolar disorder (HCC)    POTS (postural orthostatic tachycardia syndrome)     Past Surgical History:  Procedure Laterality Date   EXTRACORPOREAL CIRCULATION     LASER ABLATION CONDOLAMATA N/A 10/24/2020   Procedure: LASER ABLATION of the cervix;  Surgeon: Lazaro Arms, MD;  Location: [facility];  Service: Gynecology;  Laterality: N/A;    There were no vitals filed for this visit.   Subjective Assessment - 06/26/21 0937     Subjective Pt reports she has attempted starting with use of size 3 vaginal dilator and is ok but with mobility starts to be uncomfortable 4-5/10 pain. No pain with the size 2 and with starting 2 then progressing to 3 no pain.    Patient Stated Goals to have less pain and tolerate vaginal intercourse and tolerate tampon vs menstrual disc/cup    Currently in Pain? No/denies                               [facility] Adult PT Treatment/Exercise - 06/26/21 0001       Self-Care    Self-Care Other Self-Care Comments    Other Self-Care Comments  pt educated on proper techinques for all exercises with modifications as needed and various ways to implement relaxation into exercises/meditation for improved down regulation of nervous system.      Exercises   Exercises Knee/Hip;Lumbar      Lumbar Exercises: Stretches   Active Hamstring Stretch Right;Left;2 reps;30 seconds    Other Lumbar Stretch Exercise happy baby 2x45s cues for technique and deep breathing to open pelvis    Other Lumbar Stretch Exercise deep squat with overhead reach for support at doorframe x45s      Lumbar Exercises: Seated   Other Seated Lumbar Exercises thoracic opening in side sitting 2x45s    Other Seated Lumbar Exercises V-sit 45s manual facilitation for trunk extension and anterior pelvic tilt      Lumbar Exercises: Quadruped   Madcat/Old Horse 10 reps    Other Quadruped Lumbar Exercises needle threaders x2 Rt and Lt for 45s                       PT Short Term Goals - 06/12/21 1023       PT SHORT TERM GOAL #1   Title Pt to be I  with HEP    Time 6    Period Weeks    Status On-going    Target Date 06/05/21      PT SHORT TERM GOAL #2   Title pt to demonstrated improved pain with vaginal penetration to no more than 4/10 with size 2 dialator to equivalent sizing for progression to tolerate vaginal penetration.    Time 6    Period Weeks    Status Achieved    Target Date 06/05/21      PT SHORT TERM GOAL #3   Title pt to demonstrate I ability to self correct and complete diaphragmatic breathing technique to promote improved pelvic relaxation    Time 6    Period Weeks    Status Achieved    Target Date 06/05/21               PT Long Term Goals - 04/24/21 1041       PT LONG TERM GOAL #1   Title pt to be I with advanced HEP    Time 4    Period Months    Status New    Target Date 08/25/21      PT LONG TERM GOAL #2   Title pt to demonstrate improved bil hip  strength to at least 5/5 globally for improved functional mobility and decreased compensatory strategies    Time 4    Period Months    Status New    Target Date 08/25/21      PT LONG TERM GOAL #3   Title pt to report no more than 2/10 pain with size 4 vaginal dialator or size equivalent for progression to tolerate vaginal penetration    Time 4    Period Months    Status New    Target Date 08/25/21                   Plan - 06/26/21 1018     Clinical Impression Statement Pt presenting to clinic reporting she is compliant with HEP and use of dilators at home and reports she wants to find more pelvic relaxation videos to completed at home and educated on ways to find these. Pt reports she is progressing with starting with dilator size 3 and has no pain with penetration but 3-4/10 pain with mobility interally however no pain with starting with size 2 then progressing to size 3, educated to progress as tolerated and as able and reports she understands how to complete this. Pt also reports her goal is to use either menstrual disc or cup and educated with product sample on insertion and removal of these with product sizing to compare to dilator sizing and pt reports this would be her goal to improve tolerance to this and vaginal intercourse without pain. Pt session then focused on pelvis and lower body stretching with diaphragmatic breathing throughout, pt required manual facilitation to improve body awareness, spatial awareness and improved positioning for optimal stretching of proper tissues. A few stretches pulled from pt's HEP and pt demonstrated need for these cues as well indicating she is completing HEP but not in optimal positions, all educated and demonstrated by pt with improved foam. Pt educated to attempt if able in front of a mirror to better assess positioning for these and ways to implement relaxation for her individually to improve down regulation of nervous system to decrease  tightness throughout. Pt also educated on posture during work day to improve trunk mobility and positioning. Pt tolerated session well  denied additional questions and would benefit from continued PT improve flexibility, pelvic floor relaxation, decreased pain and ability to tolerate vaginal penetration without pain.    Personal Factors and Comorbidities Time since onset of injury/illness/exacerbation;Fitness    Examination-Activity Limitations Other    Examination-Participation Restrictions Interpersonal Relationship    Stability/Clinical Decision Making Evolving/Moderate complexity    Clinical Decision Making Moderate    Rehab Potential Good    PT Frequency 1x / week    PT Duration 12 weeks    PT Treatment/Interventions ADLs/Self Care Home Management;Functional mobility training;Therapeutic activities;Therapeutic exercise;Manual techniques;Passive range of motion;Dry needling;Taping;Patient/family education;Neuromuscular re-education;Energy conservation    PT Next Visit Plan internal work if needed to assess improved relaxation ability, strength, coordination    PT Home Exercise Plan Access Code [email or web address]    Consulted and Agree with Plan of Care Patient             Patient will benefit from skilled therapeutic intervention in order to improve the following deficits and impairments:  Decreased coordination, Decreased range of motion, Decreased endurance, Decreased strength, Decreased mobility, Postural dysfunction, Improper body mechanics, Impaired flexibility, Pain  Visit Diagnosis: Lack of coordination  Muscle weakness (generalized)  Abnormal posture     Problem List Patient Active Problem List   Diagnosis Date Noted   Chest pain 06/22/2021   Anxiety with depression 06/22/2021   Chronic nonintractable headache 06/22/2021   Abnormal Pap smear of cervix 09/08/2020   Nexplanon in place 08/28/2020   Major depressive disorder, recurrent, severe without psychotic features (HCC)  07/01/2018   Generalized anxiety disorder 07/01/2018   Moderate episode of recurrent major depressive disorder (HCC) 04/11/2018   Major depression, recurrent (HCC) 01/07/2014   Autonomic dysreflexia 10/24/2012   Vasovagal near syncope 05/24/2012   Postural orthostatic tachycardia syndrome 08/27/2011  Otelia Sergeant, PT, DPT 06/26/2209:29 AM   [facility] [address], [address] [place], [place], [address] Phone: [phone]   Fax:  [phone]  Name: Victoria [name] MRN: [id number] Date of Birth: [birth year]

## 2021-06-30 ENCOUNTER — Other Ambulatory Visit: Payer: Self-pay

## 2021-06-30 ENCOUNTER — Ambulatory Visit (INDEPENDENT_AMBULATORY_CARE_PROVIDER_SITE_OTHER): Payer: 59 | Admitting: Licensed Clinical Social Worker

## 2021-06-30 DIAGNOSIS — F419 Anxiety disorder, unspecified: Secondary | ICD-10-CM

## 2021-06-30 DIAGNOSIS — F332 Major depressive disorder, recurrent severe without psychotic features: Secondary | ICD-10-CM

## 2021-07-03 ENCOUNTER — Other Ambulatory Visit: Payer: Self-pay

## 2021-07-03 ENCOUNTER — Ambulatory Visit: Payer: 59 | Admitting: Physical Therapy

## 2021-07-03 ENCOUNTER — Encounter: Payer: Self-pay | Admitting: Physical Therapy

## 2021-07-03 DIAGNOSIS — R279 Unspecified lack of coordination: Secondary | ICD-10-CM | POA: Diagnosis not present

## 2021-07-03 DIAGNOSIS — R252 Cramp and spasm: Secondary | ICD-10-CM | POA: Diagnosis not present

## 2021-07-03 DIAGNOSIS — R2689 Other abnormalities of gait and mobility: Secondary | ICD-10-CM | POA: Diagnosis not present

## 2021-07-03 DIAGNOSIS — R293 Abnormal posture: Secondary | ICD-10-CM | POA: Diagnosis not present

## 2021-07-03 DIAGNOSIS — M6281 Muscle weakness (generalized): Secondary | ICD-10-CM | POA: Diagnosis not present

## 2021-07-03 NOTE — Therapy (Signed)
[facility] [address] [place], [place], [address] Phone: [phone]   Fax:  [phone]  Physical Therapy Treatment  Patient Details  Name: Victoria [name] MRN: [id number] Date of Birth: [birth year] Referring Provider (PT): Cheral Marker, [place]   Encounter Date: 07/03/2021   PT End of Session - 07/03/21 1040     Visit Number 9    Date for PT Re-Evaluation 08/25/21    Authorization Type AETNA    Authorization - Visit Number 9    PT Start Time 0934    PT Stop Time 1008    PT Time Calculation (min) 34 min    Activity Tolerance Patient tolerated treatment well;Patient limited by fatigue    Behavior During Therapy [facility] for tasks assessed/performed             Past Medical History:  Diagnosis Date   Abnormal Papanicolaou smear of cervix with positive human papilloma virus (HPV) test 09/08/2020   Pap was LSIL +HPV:  Needs colpo per ASCCP guidelines,  CIN 3+ immediate risk is 4.3%______________   Bipolar disorder (HCC)    POTS (postural orthostatic tachycardia syndrome)     Past Surgical History:  Procedure Laterality Date   EXTRACORPOREAL CIRCULATION     LASER ABLATION CONDOLAMATA N/A 10/24/2020   Procedure: LASER ABLATION of the cervix;  Surgeon: Lazaro Arms, MD;  Location: [facility];  Service: Gynecology;  Laterality: N/A;    There were no vitals filed for this visit.   Subjective Assessment - 07/03/21 0939     Subjective Pt reports she continues to attempt home use with vaginal dilators and unable to start with size three but able to start with 2 and progress to 3. No pain with 2 and 4/10 pain with size 3. Pt reports having her period this past week and also started a new medication for axiety but had side effects that made her sick and nausous which she has stopped.    How long can you sit comfortably? no limitations    How long can you stand comfortably? no limitations    How long can you walk  comfortably? no limitations    Patient Stated Goals to have less pain and tolerate vaginal intercourse and tolerate tampon vs menstrual disc/cup    Currently in Pain? No/denies                               [facility] Adult PT Treatment/Exercise - 07/03/21 0001       Manual Therapy   Manual Therapy Soft tissue mobilization    Manual therapy comments manual work at coccyx as pt noted slight discomfort with sitting at this area and mild tightness noted at Rt side and noted improvment with mobilty post this treatment and pt reported no longer feeling this discomfort .                     PT Education - 07/03/21 1040     Education Details Pt educated on continued dilator progress and relaxation technique    Person(s) Educated Patient    Methods Explanation;Demonstration;Tactile cues;Verbal cues    Comprehension Verbalized understanding;Returned demonstration              PT Short Term Goals - 06/12/21 1023       PT SHORT TERM GOAL #1   Title Pt to be I with HEP    Time  6    Period Weeks    Status On-going    Target Date 06/05/21      PT SHORT TERM GOAL #2   Title pt to demonstrated improved pain with vaginal penetration to no more than 4/10 with size 2 dialator to equivalent sizing for progression to tolerate vaginal penetration.    Time 6    Period Weeks    Status Achieved    Target Date 06/05/21      PT SHORT TERM GOAL #3   Title pt to demonstrate I ability to self correct and complete diaphragmatic breathing technique to promote improved pelvic relaxation    Time 6    Period Weeks    Status Achieved    Target Date 06/05/21               PT Long Term Goals - 04/24/21 1041       PT LONG TERM GOAL #1   Title pt to be I with advanced HEP    Time 4    Period Months    Status New    Target Date 08/25/21      PT LONG TERM GOAL #2   Title pt to demonstrate improved bil hip strength to at least 5/5 globally for improved functional  mobility and decreased compensatory strategies    Time 4    Period Months    Status New    Target Date 08/25/21      PT LONG TERM GOAL #3   Title pt to report no more than 2/10 pain with size 4 vaginal dialator or size equivalent for progression to tolerate vaginal penetration    Time 4    Period Months    Status New    Target Date 08/25/21                   Plan - 07/03/21 1108     Clinical Impression Statement Pt presents to clinic this week reporting she was still struggling progressing dilator size 3 without starting with size 2 and no having pain. Pt reported she was unable to complete a lot of HEP this week due to being sick with new medication and being her period. Pt starting to feel better and agreeable to starting slow this week and reattempting as able. Pt requested lighter session this date due to not feeling well and did demonstrate tightness in bil adductors and hamstrings. Session focused on manual work at these areas with suction cup to assist in tissue mobilization and Addaday used to improve mobility. Pt reported feeling better after this and educated to continue dilator use and HEP. Pt agreeable.    Personal Factors and Comorbidities Time since onset of injury/illness/exacerbation;Fitness    Examination-Activity Limitations Other    Examination-Participation Restrictions Interpersonal Relationship    Stability/Clinical Decision Making Evolving/Moderate complexity    Clinical Decision Making Moderate    Rehab Potential Good    PT Frequency 1x / week    PT Duration 12 weeks    PT Treatment/Interventions ADLs/Self Care Home Management;Functional mobility training;Therapeutic activities;Therapeutic exercise;Manual techniques;Passive range of motion;Dry needling;Taping;Patient/family education;Neuromuscular re-education;Energy conservation    PT Next Visit Plan manual work at [employer], adductors, hamstrings, rib mobility    PT Home Exercise Plan Access Code [email or web address]     Consulted and Agree with Plan of Care Patient             Patient will benefit from skilled therapeutic intervention in order to improve the following deficits and  impairments:  Decreased coordination, Decreased range of motion, Decreased endurance, Decreased strength, Decreased mobility, Postural dysfunction, Improper body mechanics, Impaired flexibility, Pain  Visit Diagnosis: Lack of coordination  Cramp and spasm  Abnormal posture     Problem List Patient Active Problem List   Diagnosis Date Noted   Chest pain 06/22/2021   Anxiety with depression 06/22/2021   Chronic nonintractable headache 06/22/2021   Abnormal Pap smear of cervix 09/08/2020   Nexplanon in place 08/28/2020   Major depressive disorder, recurrent, severe without psychotic features (HCC) 07/01/2018   Generalized anxiety disorder 07/01/2018   Moderate episode of recurrent major depressive disorder (HCC) 04/11/2018   Major depression, recurrent (HCC) 01/07/2014   Autonomic dysreflexia 10/24/2012   Vasovagal near syncope 05/24/2012   Postural orthostatic tachycardia syndrome 08/27/2011   Otelia Sergeant, PT, DPT 07/03/2210:19 AM   [facility] [address] [place], [place], [address] Phone: [phone]   Fax:  [phone]  Name: KAYLIEE ATIENZA MRN: [id number] Date of Birth: [birth year]

## 2021-07-06 ENCOUNTER — Other Ambulatory Visit: Payer: Self-pay

## 2021-07-06 ENCOUNTER — Ambulatory Visit: Payer: 59 | Admitting: Physical Therapy

## 2021-07-06 ENCOUNTER — Encounter: Payer: Self-pay | Admitting: Physical Therapy

## 2021-07-06 DIAGNOSIS — R2689 Other abnormalities of gait and mobility: Secondary | ICD-10-CM

## 2021-07-06 DIAGNOSIS — R252 Cramp and spasm: Secondary | ICD-10-CM | POA: Diagnosis not present

## 2021-07-06 DIAGNOSIS — M6281 Muscle weakness (generalized): Secondary | ICD-10-CM | POA: Diagnosis not present

## 2021-07-06 DIAGNOSIS — R293 Abnormal posture: Secondary | ICD-10-CM | POA: Diagnosis not present

## 2021-07-06 DIAGNOSIS — R279 Unspecified lack of coordination: Secondary | ICD-10-CM | POA: Diagnosis not present

## 2021-07-06 NOTE — Therapy (Signed)
[facility] [address], [address] [place], [place], [address] Phone: [phone]   Fax:  [phone]  Physical Therapy Treatment  Patient Details  Name: Victoria [name] MRN: [id number] Date of Birth: Feb 10, 1996 Referring Provider (PT): Cheral Marker, [place]   Encounter Date: 07/06/2021   PT End of Session - 07/06/21 1012     Visit Number 10    Date for PT Re-Evaluation 08/25/21    Authorization Type AETNA    Authorization - Visit Number 10    PT Start Time 0935    PT Stop Time 1015    PT Time Calculation (min) 40 min    Activity Tolerance Patient tolerated treatment well;Patient limited by fatigue    Behavior During Therapy [facility] for tasks assessed/performed             Past Medical History:  Diagnosis Date   Abnormal Papanicolaou smear of cervix with positive human papilloma virus (HPV) test 09/08/2020   Pap was LSIL +HPV:  Needs colpo per ASCCP guidelines,  CIN 3+ immediate risk is 4.3%______________   Bipolar disorder (HCC)    POTS (postural orthostatic tachycardia syndrome)     Past Surgical History:  Procedure Laterality Date   EXTRACORPOREAL CIRCULATION     LASER ABLATION CONDOLAMATA N/A 10/24/2020   Procedure: LASER ABLATION of the cervix;  Surgeon: Lazaro Arms, MD;  Location: [facility];  Service: Gynecology;  Laterality: N/A;    There were no vitals filed for this visit.   Subjective Assessment - 07/06/21 0937     Subjective Pt reports she is feeling a little better today but not 100% yet after side effects from medication. No change in dilator progress since last friday session.    How long can you sit comfortably? no limitations    How long can you stand comfortably? no limitations    How long can you walk comfortably? no limitations    Patient Stated Goals to have less pain and tolerate vaginal intercourse and tolerate tampon vs menstrual disc/cup    Currently in Pain? No/denies                                [facility] Adult PT Treatment/Exercise - 07/06/21 0001       Self-Care   Self-Care Other Self-Care Comments    Other Self-Care Comments  posture correction during work day, standing options at computer, and changing positions.      Lumbar Exercises: Stretches   Other Lumbar Stretch Exercise childs pose x45s and bil lean childs pose x45s      Lumbar Exercises: Quadruped   Madcat/Old Horse 5 reps      Manual Therapy   Manual Therapy Soft tissue mobilization    Manual therapy comments manual work at rib cage for improved expansion on bil sides with inhale and exhale x8 each and suction cup at bil lateral rib cage. also addaday gun at bil hip abductors, hamstrings and glutes for improved tissue mobility and decrease strain of tissue tension at pelvis to decrease pain.                     PT Education - 07/06/21 1011     Education Details Pt educated on continued HEP and dilator at home, posture corrections with working at computer and diaphragmatic breathing.    Person(s) Educated Patient    Methods Explanation;Demonstration;Tactile cues;Verbal cues  Comprehension Verbalized understanding;Returned demonstration              PT Short Term Goals - 06/12/21 1023       PT SHORT TERM GOAL #1   Title Pt to be I with HEP    Time 6    Period Weeks    Status On-going    Target Date 06/05/21      PT SHORT TERM GOAL #2   Title pt to demonstrated improved pain with vaginal penetration to no more than 4/10 with size 2 dialator to equivalent sizing for progression to tolerate vaginal penetration.    Time 6    Period Weeks    Status Achieved    Target Date 06/05/21      PT SHORT TERM GOAL #3   Title pt to demonstrate I ability to self correct and complete diaphragmatic breathing technique to promote improved pelvic relaxation    Time 6    Period Weeks    Status Achieved    Target Date 06/05/21               PT Long Term Goals  - 04/24/21 1041       PT LONG TERM GOAL #1   Title pt to be I with advanced HEP    Time 4    Period Months    Status New    Target Date 08/25/21      PT LONG TERM GOAL #2   Title pt to demonstrate improved bil hip strength to at least 5/5 globally for improved functional mobility and decreased compensatory strategies    Time 4    Period Months    Status New    Target Date 08/25/21      PT LONG TERM GOAL #3   Title pt to report no more than 2/10 pain with size 4 vaginal dialator or size equivalent for progression to tolerate vaginal penetration    Time 4    Period Months    Status New    Target Date 08/25/21                   Plan - 07/06/21 1012     Clinical Impression Statement Pt presents to clinic reporting she is feeling a little better today but still not 100% better after last week side effects to medication and has made her MD aware of this. Pt reports she continues to be at same progress on dilators at home with greatest limitation being starting with size 3 and she still has pain with mobility however no pain with starting at size 2 and progressing to three. Pt session focused on relaxation techniques with diaphragmatic breathing to down regulate tension globally, educated on posture corrections with working at home at computer and to change positions as able and standing as able, also to continue HEP, maual work at [employer] rib cage for improved mobility with inhale/exhale with manual faciliation and suction cup used to assist in tissue mobility and decrease restrictions, also Addaday gun used at bil hamstrings, glutes, and abductors for improved tissue mobility and decrease strain at pelvis to decrease pain. Pt reported feeling better at end of session and verbalized understanding of HEP and to continue dilator use at home. Pt also directed in lumbar stretches with diaphragmaic breathing and continues to benefit from manual assist for improved positioning and tehcnique. Pt  continues to demonstrate need of PT as she still has pain at pelvic with vaginal penetration unable to use  tampons or other period internal products, unable to have intercourse due to pain, and continues to have pelvic tightness and global tightness.    Personal Factors and Comorbidities Time since onset of injury/illness/exacerbation;Fitness    Examination-Activity Limitations Other    Examination-Participation Restrictions Interpersonal Relationship    Stability/Clinical Decision Making Evolving/Moderate complexity    Clinical Decision Making Moderate    Rehab Potential Good    PT Frequency 1x / week    PT Duration 12 weeks    PT Treatment/Interventions ADLs/Self Care Home Management;Functional mobility training;Therapeutic activities;Therapeutic exercise;Manual techniques;Passive range of motion;Dry needling;Taping;Patient/family education;Neuromuscular re-education;Energy conservation    PT Next Visit Plan internal for improved relaxation techniques    PT Home Exercise Plan Access Code [email or web address]    Consulted and Agree with Plan of Care Patient             Patient will benefit from skilled therapeutic intervention in order to improve the following deficits and impairments:  Decreased coordination, Decreased range of motion, Decreased endurance, Decreased strength, Decreased mobility, Postural dysfunction, Improper body mechanics, Impaired flexibility, Pain  Visit Diagnosis: Lack of coordination  Other abnormalities of gait and mobility  Abnormal posture     Problem List Patient Active Problem List   Diagnosis Date Noted   Chest pain 06/22/2021   Anxiety with depression 06/22/2021   Chronic nonintractable headache 06/22/2021   Abnormal Pap smear of cervix 09/08/2020   Nexplanon in place 08/28/2020   Major depressive disorder, recurrent, severe without psychotic features (HCC) 07/01/2018   Generalized anxiety disorder 07/01/2018   Moderate episode of recurrent major  depressive disorder (HCC) 04/11/2018   Major depression, recurrent (HCC) 01/07/2014   Autonomic dysreflexia 10/24/2012   Vasovagal near syncope 05/24/2012   Postural orthostatic tachycardia syndrome 08/27/2011    Otelia Sergeant, PT, DPT 07/06/2209:23 AM    [facility] [address] [place], [place], [address] Phone: [phone]   Fax:  [phone]  Name: Victoria [name] MRN: [id number] Date of Birth: January 25, 1996

## 2021-07-09 ENCOUNTER — Ambulatory Visit (INDEPENDENT_AMBULATORY_CARE_PROVIDER_SITE_OTHER): Payer: 59 | Admitting: Psychology

## 2021-07-09 ENCOUNTER — Other Ambulatory Visit: Payer: Self-pay | Admitting: Internal Medicine

## 2021-07-09 DIAGNOSIS — F319 Bipolar disorder, unspecified: Secondary | ICD-10-CM | POA: Diagnosis not present

## 2021-07-09 DIAGNOSIS — I498 Other specified cardiac arrhythmias: Secondary | ICD-10-CM

## 2021-07-09 DIAGNOSIS — R69 Illness, unspecified: Secondary | ICD-10-CM | POA: Diagnosis not present

## 2021-07-09 DIAGNOSIS — G90A Postural orthostatic tachycardia syndrome (POTS): Secondary | ICD-10-CM

## 2021-07-10 NOTE — BH Specialist Note (Signed)
[place] Initial TeleMedicine Clinical Assessment  MRN: [id number] NAME: Victoria [name] Date: 07/10/21  Start time: 49 End time: 9509 Total time: 15  Types of Service: Telephone visit Referring Provider: Dr. Posey Pronto Reason for Visit today: begin [facility] service  Patient/Family location: home [place] Provider location: home office All persons participating in visit: yes  I connected with patient and/or family via Telephone or Video Enabled Telemedicine Application  (Video is Caregility application) and verified that I am speaking with the correct person using two identifiers.   Discussed confidentiality: Yes   I discussed the limitations of telemedicine and the availability of in person appointments.  Discussed there is a possibility of technology failure and discussed alternative modes of communication if that failure occurs.  I discussed that engaging in this telemedicine visit, they consent to the provision of behavioral healthcare and the services will be billed under their insurance.  Patient and/or legal guardian expressed understanding and consented to Telemedicine visit: Yes   Treatment History Patient recently received Inpatient Treatment: No  Patient currently being seen by therapist/psychiatrist: No  Patient currently receiving the following services:   Past Psychiatric History/Diagnosis/Hospitalization(s): Anxiety: No Bipolar Disorder: No Depression: No Mania: No Psychosis: No Schizophrenia: No Personality Disorder: No Hospitalization for psychiatric illness: No History of Electroconvulsive Shock Therapy: No Prior Suicide Attempts: No  Decreased need for sleep: Yes  Euphoria: No  Self Injurious behaviors: No  Family History of mental illness: No  Family History of substance abuse: No  Substance Abuse: No  DUI: No  Insomnia: No   History of violence: No  Physical, sexual or emotional abuse: No  Prior outpatient mental health  therapy: No    Social Functioning Social maturity: wnl Social judgement: wnl  Stress Current stressors: sadness Familial stressors: denies Sleep: cycles Appetite: fair Coping ability: overwhelmed Patient taking medications as prescribed:  yes  Current medications:  Outpatient Encounter Medications as of 06/30/2021  Medication Sig   etonogestrel (NEXPLANON) 68 MG IMPL implant 1 each by Subdermal route once.   ondansetron (ZOFRAN ODT) 8 MG disintegrating tablet Take 1 tablet (8 mg total) by mouth every 8 (eight) hours as needed for nausea or vomiting. (Patient not taking: No sig reported)   sertraline (ZOLOFT) 50 MG tablet Take 1 tablet (50 mg total) by mouth daily.   [DISCONTINUED] fludrocortisone (FLORINEF) 0.1 MG tablet Take 1 tablet (0.1 mg total) by mouth 2 (two) times daily.   No facility-administered encounter medications on file as of 06/30/2021.     Self-harm and/or Suicidal Behaviors Risk Assessment Self-harm risk factors: no Patient endorses recent self injurious thoughts and/or behaviors: No  Suicide ideations: No plan to harm self or others   Danger to Others Risk Assessment Danger to others risk factors: no Patient endorses recent thoughts of harming others: No    Substance Use Assessment Patient recently consumed alcohol: No  Patient recently used drugs: No  Patient is concerned about dependence or abuse of substances: No    Goals, Interventions and Follow-up Plan Goals: Increase healthy adjustment to current life circumstances Interventions: Behavioral Activation Follow-up Plan:  Biweekly follow up  Summary of Clinical Assessment Summary: Victoria [name] is a 25 yr old who presents for [facility] service.  Victoria [name] reports that she wants to be able to manage her mood.  Most recently began Zoloft.  Provided psychoeducation on anxiety and depression.  Therapist met with Patient in an initial therapy session to assess current mood and to build rapport. Therapist engaged  Patient in discussion about her life and what is going well for her. Therapist provided support for Patient as she shared details about her life, her current stressors, mood, coping skills, her past, and her children. Therapist prompted Patient to discuss her support system and ways that she manages her daily stress, anger, and frustrations.     Victoria M Peacock, LCSW  

## 2021-07-14 ENCOUNTER — Other Ambulatory Visit: Payer: Self-pay

## 2021-07-14 ENCOUNTER — Ambulatory Visit (INDEPENDENT_AMBULATORY_CARE_PROVIDER_SITE_OTHER): Payer: 59 | Admitting: Licensed Clinical Social Worker

## 2021-07-14 DIAGNOSIS — F419 Anxiety disorder, unspecified: Secondary | ICD-10-CM

## 2021-07-16 ENCOUNTER — Other Ambulatory Visit: Payer: Self-pay

## 2021-07-16 ENCOUNTER — Ambulatory Visit: Payer: 59 | Attending: Women's Health | Admitting: Physical Therapy

## 2021-07-16 DIAGNOSIS — R252 Cramp and spasm: Secondary | ICD-10-CM | POA: Diagnosis not present

## 2021-07-16 DIAGNOSIS — R279 Unspecified lack of coordination: Secondary | ICD-10-CM | POA: Insufficient documentation

## 2021-07-16 DIAGNOSIS — M6281 Muscle weakness (generalized): Secondary | ICD-10-CM | POA: Diagnosis not present

## 2021-07-16 NOTE — Therapy (Signed)
[facility] @ [facility] [address] [place], [place], [address] Phone:     Fax:     Physical Therapy Treatment  Patient Details  Name: Victoria [name] MRN: [id number] Date of Birth: [birth year] Referring Provider (PT): Cheral Marker, [place]   Encounter Date: 07/16/2021   PT End of Session - 07/16/21 0937     Visit Number 11    Date for PT Re-Evaluation 08/25/21    Authorization Type AETNA    Authorization - Visit Number 11    PT Start Time 0810   pt's arrival time   PT Stop Time 0843    PT Time Calculation (min) 33 min    Activity Tolerance Patient tolerated treatment well    Behavior During Therapy [facility] for tasks assessed/performed             Past Medical History:  Diagnosis Date   Abnormal Papanicolaou smear of cervix with positive human papilloma virus (HPV) test 09/08/2020   Pap was LSIL +HPV:  Needs colpo per ASCCP guidelines,  CIN 3+ immediate risk is 4.3%______________   Bipolar disorder (HCC)    POTS (postural orthostatic tachycardia syndrome)     Past Surgical History:  Procedure Laterality Date   EXTRACORPOREAL CIRCULATION     LASER ABLATION CONDOLAMATA N/A 10/24/2020   Procedure: LASER ABLATION of the cervix;  Surgeon: Lazaro Arms, MD;  Location: [facility];  Service: Gynecology;  Laterality: N/A;    There were no vitals filed for this visit.   Subjective Assessment - 07/16/21 0812     Subjective Pt reports she is starting on size three now more comfortably 2-4/10 and lessens with gentle mobility.    How long can you sit comfortably? no limitations    How long can you stand comfortably? no limitations    How long can you walk comfortably? no limitations    Patient Stated Goals to have less pain and tolerate vaginal intercourse and tolerate tampon vs menstrual disc/cup    Currently in Pain? No/denies                            Pelvic Floor Special Questions - 07/16/21 0001      Pelvic Floor Internal Exam patient identified and patient confirms consent for PT to perform inernal soft tissue work and muscle strength and integrity assessment    Exam Type Vaginal    Palpation no pain throughout treatment or with insertion of gloved finger.    Strength fair squeeze, definite lift    Strength # of reps 5    Strength # of seconds 4   4   Tone improving but noted tension continued throughout.               [facility] Adult PT Treatment/Exercise - 07/16/21 0001       Neuro Re-ed    Neuro Re-ed Details  pt directed in diaphragmatic breathing to promote improved pelvic floor relaxation with internal pelvic floor treatment.Pt directed in visualization cues for improved tissue mobility for full relaxation with noted improvement but with extra time.      Manual Therapy   Manual Therapy Myofascial release    Manual therapy comments manual work at Rt and Teacher, music. Noted improvment with decreased tension with superficial muscle.                     PT Education -  07/16/21 0851     Education Details Pt educated on continued dilator use at home and to attempt visual feeback with mirror use or self inserting into vagina for feedback on relax/contract.    Person(s) Educated Patient    Methods Explanation;Demonstration;Tactile cues;Verbal cues    Comprehension Verbalized understanding;Returned demonstration              PT Short Term Goals - 06/12/21 1023       PT SHORT TERM GOAL #1   Title Pt to be I with HEP    Time 6    Period Weeks    Status On-going    Target Date 06/05/21      PT SHORT TERM GOAL #2   Title pt to demonstrated improved pain with vaginal penetration to no more than 4/10 with size 2 dialator to equivalent sizing for progression to tolerate vaginal penetration.    Time 6    Period Weeks    Status Achieved    Target Date 06/05/21      PT SHORT TERM GOAL #3   Title pt to demonstrate I ability to self correct and complete  diaphragmatic breathing technique to promote improved pelvic relaxation    Time 6    Period Weeks    Status Achieved    Target Date 06/05/21               PT Long Term Goals - 04/24/21 1041       PT LONG TERM GOAL #1   Title pt to be I with advanced HEP    Time 4    Period Months    Status New    Target Date 08/25/21      PT LONG TERM GOAL #2   Title pt to demonstrate improved bil hip strength to at least 5/5 globally for improved functional mobility and decreased compensatory strategies    Time 4    Period Months    Status New    Target Date 08/25/21      PT LONG TERM GOAL #3   Title pt to report no more than 2/10 pain with size 4 vaginal dialator or size equivalent for progression to tolerate vaginal penetration    Time 4    Period Months    Status New    Target Date 08/25/21             No emotional/communication barriers or cognitive limitation. Patient is motivated to learn. Patient understands and agrees with treatment goals and plan. PT explains patient will be examined in standing, sitting, and lying down to see how their muscles and joints work. When they are ready, they will be asked to remove their underwear so PT can examine their perineum. The patient is also given the option of providing their own chaperone as one is not provided in our facility. The patient also has the right and is explained the right to defer or refuse any part of the evaluation or treatment including the internal exam. With the patient's consent, PT will use one gloved finger to gently assess the muscles of the pelvic floor, seeing how well it contracts and relaxes and if there is muscle symmetry. After, the patient will get dressed and PT and patient will discuss exam findings and plan of care. PT and patient discuss plan of care, schedule, attendance policy and HEP activities.       Plan - 07/16/21 0937     Clinical Impression Statement Pt presents to clinic  reporting feelinf  better and has been continuing to complete dilator use at home and now using size 3 with less at 4/10 pain with penetration and mobility. Pt agreeable to internal treatment this date and found to have continued muscle tightness throughout pelvic floor though improvement with superficial muscles compared to previous sessions with internal treatments. Trigger points found in bil levator ani muscles and released with manual work with pt reporting this felt better. Pt benefited from cues for breathing to improve pelvic relaxation and promote improved tissue mobility. Pt demonstrated difficulty with this and though she does demonstrate at least 3/5 strength this may be due to tightness limiting mobility and therefore difficult to identify true weakness or strength due to tightness. Pt educated on this and verbalized understanding. Pt does continue to demonstrate decreased endurance and coordination as well however this may also be due to tightness. Pt educated on completing visual feedback at home with mirror use for contract/relaxation of pelvic floor or self insertion of finger to feel relaxation or contraction as well. Pt tolerated session well and denied additional questions. Pt would benefit from continued PT to further address pelvic relaxation and stretching with internal mobility at pelvic floor for improved QOL, decreased chronic pain, and improve ability for vaginal penetration for intercourse. Pt also continues to need skilled cues for coordination and technique for relaxation vs contraction at pelvic floor.    Personal Factors and Comorbidities Time since onset of injury/illness/exacerbation;Fitness    Examination-Activity Limitations Other    Examination-Participation Restrictions Interpersonal Relationship    Stability/Clinical Decision Making Evolving/Moderate complexity    Clinical Decision Making Moderate    Rehab Potential Good    PT Frequency 1x / week    PT Duration 12 weeks    PT  Treatment/Interventions ADLs/Self Care Home Management;Functional mobility training;Therapeutic activities;Therapeutic exercise;Manual techniques;Passive range of motion;Dry needling;Taping;Patient/family education;Neuromuscular re-education;Energy conservation;Aquatic Therapy    PT Next Visit Plan internal for improved relaxation techniques    PT Home Exercise Plan Access Code [email or web address]    Consulted and Agree with Plan of Care Patient             Patient will benefit from skilled therapeutic intervention in order to improve the following deficits and impairments:  Decreased coordination, Decreased range of motion, Decreased endurance, Decreased strength, Decreased mobility, Postural dysfunction, Improper body mechanics, Impaired flexibility, Pain  Visit Diagnosis: Lack of coordination  Muscle weakness (generalized)  Cramp and spasm     Problem List Patient Active Problem List   Diagnosis Date Noted   Chest pain 06/22/2021   Anxiety with depression 06/22/2021   Chronic nonintractable headache 06/22/2021   Abnormal Pap smear of cervix 09/08/2020   Nexplanon in place 08/28/2020   Major depressive disorder, recurrent, severe without psychotic features (HCC) 07/01/2018   Generalized anxiety disorder 07/01/2018   Moderate episode of recurrent major depressive disorder (HCC) 04/11/2018   Major depression, recurrent (HCC) 01/07/2014   Autonomic dysreflexia 10/24/2012   Vasovagal near syncope 05/24/2012   Postural orthostatic tachycardia syndrome 08/27/2011   Otelia Sergeant, PT, DPT 07/16/2209:36 AM   [facility] [address] [place], [place], [address] Phone:     Fax:     Name: Victoria [name] MRN: [id number] Date of Birth: 1996/06/24

## 2021-07-17 ENCOUNTER — Other Ambulatory Visit: Payer: Self-pay | Admitting: Internal Medicine

## 2021-07-17 DIAGNOSIS — F419 Anxiety disorder, unspecified: Secondary | ICD-10-CM

## 2021-07-17 NOTE — BH Specialist Note (Signed)
[facility] Follow Up Assessment  MRN: [id number] NAME: Victoria [name] Date: 07/17/21  Start time: 1130 End time: 1145 Total time: 15  Type of Contact: Follow up Call  Current concerns/stressors: anxiety  Screens/Assessment Tools:  Unable to get a read as Patient was not feeling well  Functional Assessment:  Sleep: poor Appetite: poor Coping ability: overwhelmed Patient taking medications as prescribed:    Current medications:  Outpatient Encounter Medications as of 07/14/2021  Medication Sig   etonogestrel (NEXPLANON) 68 MG IMPL implant 1 each by Subdermal route once.   fludrocortisone (FLORINEF) 0.1 MG tablet TAKE 1 TABLET BY MOUTH 2 TIMES DAILY.   ondansetron (ZOFRAN ODT) 8 MG disintegrating tablet Take 1 tablet (8 mg total) by mouth every 8 (eight) hours as needed for nausea or vomiting. (Patient not taking: No sig reported)   sertraline (ZOLOFT) 50 MG tablet Take 1 tablet (50 mg total) by mouth daily.   No facility-administered encounter medications on file as of 07/14/2021.    Self-harm and/or Suicidal Behaviors Risk Assessment Self-harm risk factors: no Patient endorses recent self injurious thoughts and/or behaviors: No   Suicide ideations: No plan to harm self or others   Danger to Others Risk Assessment Danger to others risk factors: no Patient endorses recent thoughts of harming others: No    Substance Use Assessment Patient recently consumed alcohol: No  Patient recently used drugs: No  Patient is concerned about dependence or abuse of substances: No    Goals, Interventions and Follow-up Plan Goals: Increase healthy adjustment to current life circumstances Interventions: Mindfulness or Relaxation Training and CBT Cognitive Behavioral Therapy   Summary of Clinical Assessment  Victoria [name] is a 25 yr old woman who presents for follow up. She reports that she is worried about her chest pains which she believes may be anxiety.  She reports that  she is doing well and has not had any anxiety the past 2 days.  Provided psychoeducation on anxiety. LCSW offered education about common irrational fears and beliefs that contribute to anxiety. Reviewed and showed client how to use CBT/REBT strategies to recognize and re-frame irrational fears and self-talk as a means of increasing client's capacity to handle their anxiety more constructively.   She reports that she continues to take her 50mg  of Zoloft daily.   Follow-up Plan:  biweekly [facility] service , LCSW

## 2021-07-22 ENCOUNTER — Encounter: Payer: 59 | Admitting: Physical Therapy

## 2021-07-23 ENCOUNTER — Ambulatory Visit (INDEPENDENT_AMBULATORY_CARE_PROVIDER_SITE_OTHER): Payer: 59 | Admitting: Psychology

## 2021-07-23 DIAGNOSIS — R69 Illness, unspecified: Secondary | ICD-10-CM | POA: Diagnosis not present

## 2021-07-23 DIAGNOSIS — F319 Bipolar disorder, unspecified: Secondary | ICD-10-CM | POA: Diagnosis not present

## 2021-07-28 ENCOUNTER — Other Ambulatory Visit: Payer: Self-pay

## 2021-07-28 ENCOUNTER — Ambulatory Visit (INDEPENDENT_AMBULATORY_CARE_PROVIDER_SITE_OTHER): Payer: 59 | Admitting: Licensed Clinical Social Worker

## 2021-07-28 DIAGNOSIS — F332 Major depressive disorder, recurrent severe without psychotic features: Secondary | ICD-10-CM

## 2021-07-28 DIAGNOSIS — F419 Anxiety disorder, unspecified: Secondary | ICD-10-CM

## 2021-07-29 ENCOUNTER — Encounter: Payer: Self-pay | Admitting: Physical Therapy

## 2021-07-29 ENCOUNTER — Ambulatory Visit: Payer: 59 | Admitting: Physical Therapy

## 2021-07-29 ENCOUNTER — Other Ambulatory Visit: Payer: Self-pay

## 2021-07-29 DIAGNOSIS — M6281 Muscle weakness (generalized): Secondary | ICD-10-CM | POA: Diagnosis not present

## 2021-07-29 DIAGNOSIS — R252 Cramp and spasm: Secondary | ICD-10-CM | POA: Diagnosis not present

## 2021-07-29 DIAGNOSIS — R279 Unspecified lack of coordination: Secondary | ICD-10-CM | POA: Diagnosis not present

## 2021-07-29 NOTE — Therapy (Signed)
[facility] [address] [place], [place], [address] Phone: [phone]   Fax:  [phone]  Physical Therapy Treatment  Patient Details  Name: Victoria [name] MRN: [id number] Date of Birth: March 16, 1996 Referring Provider (PT): Cheral Marker, [place]   Encounter Date: 07/29/2021   PT End of Session - 07/29/21 1150     Visit Number 12    Date for PT Re-Evaluation 08/25/21    Authorization Type AETNA    Authorization - Visit Number 12    PT Start Time 1102    PT Stop Time 1145    PT Time Calculation (min) 43 min    Activity Tolerance Patient tolerated treatment well    Behavior During Therapy [facility] for tasks assessed/performed             Past Medical History:  Diagnosis Date   Abnormal Papanicolaou smear of cervix with positive human papilloma virus (HPV) test 09/08/2020   Pap was LSIL +HPV:  Needs colpo per ASCCP guidelines,  CIN 3+ immediate risk is 4.3%______________   Bipolar disorder (HCC)    POTS (postural orthostatic tachycardia syndrome)     Past Surgical History:  Procedure Laterality Date   EXTRACORPOREAL CIRCULATION     LASER ABLATION CONDOLAMATA N/A 10/24/2020   Procedure: LASER ABLATION of the cervix;  Surgeon: Lazaro Arms, MD;  Location: [facility];  Service: Gynecology;  Laterality: N/A;    There were no vitals filed for this visit.   Subjective Assessment - 07/29/21 1106     Subjective Pt reports she is comfortably using size 3, now starting with 3 and plans to progress to size 4 this week.    How long can you sit comfortably? no limitations    How long can you stand comfortably? no limitations    How long can you walk comfortably? no limitations    Patient Stated Goals to have less pain and tolerate vaginal intercourse and tolerate tampon vs menstrual disc/cup    Currently in Pain? No/denies                            Pelvic Floor Special Questions - 07/29/21 0001      Pelvic Floor Internal Exam patient identified and patient confirms consent for PT to perform inernal soft tissue work and muscle strength and integrity assessment    Exam Type Vaginal    Palpation no pain throughout treatment or with insertion of gloved finger.               [facility] Adult PT Treatment/Exercise - 07/29/21 0001       Self-Care   Self-Care Other Self-Care Comments    Other Self-Care Comments  Pt educated on dilator progression, techniques for improved visual feedback at home for contract/relax      Neuro Re-ed    Neuro Re-ed Details  Pt directed in 5x10 pelvic contract/relaxations with PT providing internal feedback and cues to complete with pt demonstrating poor coodrination initially and attempting to contract with cues for relaxation and needed extra time to complete/attempt bulge. With cues pt able to consistently contract/relax with consistent cues      Manual Therapy   Manual Therapy Myofascial release    Manual therapy comments gentle stretching provided on Lt side of pelvis, no tightness noted at Rt side; to improve tissue relaxation for improved ability to complete NMRE training.  PT Education - 07/29/21 1150     Education Details Pt educated on continued dilator use at home and safe progression techniques, use of visual feedback techniques at home for improved contract/relaxation and aquatic therapy.    Person(s) Educated Patient    Methods Explanation;Demonstration;Tactile cues;Verbal cues;Handout    Comprehension Verbalized understanding;Returned demonstration              PT Short Term Goals - 06/12/21 1023       PT SHORT TERM GOAL #1   Title Pt to be I with HEP    Time 6    Period Weeks    Status On-going    Target Date 06/05/21      PT SHORT TERM GOAL #2   Title pt to demonstrated improved pain with vaginal penetration to no more than 4/10 with size 2 dialator to equivalent sizing for progression to  tolerate vaginal penetration.    Time 6    Period Weeks    Status Achieved    Target Date 06/05/21      PT SHORT TERM GOAL #3   Title pt to demonstrate I ability to self correct and complete diaphragmatic breathing technique to promote improved pelvic relaxation    Time 6    Period Weeks    Status Achieved    Target Date 06/05/21               PT Long Term Goals - 04/24/21 1041       PT LONG TERM GOAL #1   Title pt to be I with advanced HEP    Time 4    Period Months    Status New    Target Date 08/25/21      PT LONG TERM GOAL #2   Title pt to demonstrate improved bil hip strength to at least 5/5 globally for improved functional mobility and decreased compensatory strategies    Time 4    Period Months    Status New    Target Date 08/25/21      PT LONG TERM GOAL #3   Title pt to report no more than 2/10 pain with size 4 vaginal dialator or size equivalent for progression to tolerate vaginal penetration    Time 4    Period Months    Status New    Target Date 08/25/21             No emotional/communication barriers or cognitive limitation. Patient is motivated to learn. Patient understands and agrees with treatment goals and plan. PT explains patient will be examined in standing, sitting, and lying down to see how their muscles and joints work. When they are ready, they will be asked to remove their underwear so PT can examine their perineum. The patient is also given the option of providing their own chaperone as one is not provided in our facility. The patient also has the right and is explained the right to defer or refuse any part of the evaluation or treatment including the internal exam. With the patient's consent, PT will use one gloved finger to gently assess the muscles of the pelvic floor, seeing how well it contracts and relaxes and if there is muscle symmetry. After, the patient will get dressed and PT and patient will discuss exam findings and plan of care.  PT and patient discuss plan of care, schedule, attendance policy and HEP activities.       Plan - 07/29/21 1151     Clinical Impression Statement  Pt presents to clinic, missed last session due to car troubles and unable to make it. Pt reports she has been continuing to work with vaginal dilators at home and now able to insert and mobilize size 3 dilator without pain and progressing to size 4 this week. Pt educated on progression and progressing passed 4 if needed with new set of dilators. Pt reports her greatest difficulty is understanding when she is relaxed or if she is able to relax without pain as feedback at home. Pt sesson focused on internal work with PT provided feedback for pt to complete pelvic relaxation and contractions with command. Pt demonstrated poor ability to contract/relax when asked and demonstrated reversed attepmted with contracting when asked to relax/bulge. Pt benefited from single step cues and visual feedback to coordinate conractions and relaxation or bulge of pelvic floor, this really improved with extra time and reps with cues consistently. Pt then able to consistently contract/relax correctly with one cues each time. Pt reported she felt the difference but not confident in ability to complete consistently on her own. Pt educated on ways to complete visual or tactile feedback at home with mirror, her hand, dilators and external techniques as well. Pt verbalized understanding and motivated to attempt. Pt educated on aquatic therapy to further improve pelvic relaxation ability and pt agreeable to attempt and given handout.    Personal Factors and Comorbidities Time since onset of injury/illness/exacerbation;Fitness    Examination-Activity Limitations Other    Examination-Participation Restrictions Interpersonal Relationship    Stability/Clinical Decision Making Evolving/Moderate complexity    Clinical Decision Making Moderate    Rehab Potential Good    PT Frequency 1x / week     PT Duration 12 weeks    PT Treatment/Interventions ADLs/Self Care Home Management;Functional mobility training;Therapeutic activities;Therapeutic exercise;Manual techniques;Passive range of motion;Dry needling;Taping;Patient/family education;Neuromuscular re-education;Energy conservation;Aquatic Therapy    PT Next Visit Plan internal for improved relaxation techniques    PT Home Exercise Plan Access Code [email or web address]    Consulted and Agree with Plan of Care Patient             Patient will benefit from skilled therapeutic intervention in order to improve the following deficits and impairments:  Decreased coordination, Decreased range of motion, Decreased endurance, Decreased strength, Decreased mobility, Postural dysfunction, Improper body mechanics, Impaired flexibility, Pain  Visit Diagnosis: Lack of coordination  Muscle weakness (generalized)  Cramp and spasm     Problem List Patient Active Problem List   Diagnosis Date Noted   Chest pain 06/22/2021   Anxiety with depression 06/22/2021   Chronic nonintractable headache 06/22/2021   Abnormal Pap smear of cervix 09/08/2020   Nexplanon in place 08/28/2020   Major depressive disorder, recurrent, severe without psychotic features (HCC) 07/01/2018   Generalized anxiety disorder 07/01/2018   Moderate episode of recurrent major depressive disorder (HCC) 04/11/2018   Major depression, recurrent (HCC) 01/07/2014   Autonomic dysreflexia 10/24/2012   Vasovagal near syncope 05/24/2012   Postural orthostatic tachycardia syndrome 08/27/2011   Otelia Sergeant, PT, DPT 07/30/2211:57 PM   [facility] [address] [place], [place], [address] Phone: [phone]   Fax:  [phone]  Name: Victoria [name] MRN: [id number] Date of Birth: [birth year]

## 2021-07-29 NOTE — Patient Instructions (Signed)
     [facility] Welcome to [facility]! Here you will find all the information you will need regarding your pool therapy. If you have further questions at any time, please call our office at [phone]. After completing your initial evaluation in the [facility] clinic, you may be eligible to complete a portion of your therapy in the pool. A typical week of therapy will consist of 1-2 typical physical therapy visits at our [facility] location and an additional session of therapy in the pool located at the [facility] at [address]. [address], [place] [address]. The phone number at the pool site is [phone]. Please call this number if you are running late or need to cancel your appointment.  Aquatic therapy will be offered on Friday afternoons. Each session will last approximately 45 minutes. All scheduling and payments for aquatic therapy sessions, including cancelations, will be done through our [facility] location.  To be eligible for aquatic therapy, these criteria must be met: You must be able to independently change in the locker room and get to the pool deck. A caregiver can come with you to help if needed. There are bleachers for a caregiver to sit on next to the pool. No one with an open wound is permitted in the pool.  Handicap parking is available in the front and there is a drop off option for even closer accessibility. Please arrive 15 minutes prior to your appointment to prepare for your pool session. You must sign in at the front desk upon your arrival. Please be sure to attend to any toileting needs prior to entering the pool. Locker rooms for changing are located to the right of the check-in desk. There is direct access to the pool deck from the locker room. You can lock your belongings in a locker but must bring a lock. Your therapist will greet you on the pool deck. There may be other swimmers in the pool at the  same time but your session is one-on-one with the therapist.   

## 2021-07-30 ENCOUNTER — Ambulatory Visit (INDEPENDENT_AMBULATORY_CARE_PROVIDER_SITE_OTHER): Payer: 59 | Admitting: Cardiology

## 2021-07-30 ENCOUNTER — Encounter: Payer: Self-pay | Admitting: Cardiology

## 2021-07-30 VITALS — BP 108/60 | HR 89 | Ht 64.0 in | Wt 174.0 lb

## 2021-07-30 DIAGNOSIS — R0789 Other chest pain: Secondary | ICD-10-CM | POA: Diagnosis not present

## 2021-07-30 DIAGNOSIS — G90A Postural orthostatic tachycardia syndrome (POTS): Secondary | ICD-10-CM

## 2021-07-30 NOTE — Addendum Note (Signed)
Addended by: Theresia Majors on: 07/30/2021 01:53 PM   Modules accepted: Orders

## 2021-07-30 NOTE — Progress Notes (Addendum)
Cardiology CONSULT Note    Date:  07/30/2021   ID:  Victoria [name], DOB Oct 13, 1995, MRN [id number]  PCP:  Anabel Halon, MD  Cardiologist:  Armanda Magic, MD   Chief Complaint  Patient presents with   New Patient (Initial Visit)    Atypical chest pain and POTS    History of Present Illness:  Victoria [name] is a 25 y.o. female who is being seen today for the evaluation of POTS and chest pain at the request of Anabel Halon, MD.  This is a 25 year old female with a history of POTS, anxiety and bipolar disorder, chronic headaches who is referred for POTS and chest pain per patient's request. she recently saw her primary doctor and was complaining of episodic headaches and chest pain.    She tells me that she has a lot of anxiety and her PCP feels her CP is related to anxiety.  Her pain was on both sides of her chest and was sharp and sometimes an ache with no radiation to her neck or arms.   It is not related to activity.  There are no associated sx of nausea, diaphoresis or SOB.  She occasionally has some mild DOE.  Apparently had stopped using her Lamictal and Zoloft that she uses for depression and anxiety.  She takes fludrocortisone for her POTS and has not had any problems with syncope but has had some increase with dizziness.  She has never smoked.  It was felt that her chest pain was related to anxiety and EKG was normal.  Past Medical History:  Diagnosis Date   Abnormal Papanicolaou smear of cervix with positive human papilloma virus (HPV) test 09/08/2020   Pap was LSIL +HPV:  Needs colpo per ASCCP guidelines,  CIN 3+ immediate risk is 4.3%______________   Bipolar disorder (HCC)    POTS (postural orthostatic tachycardia syndrome)     Past Surgical History:  Procedure Laterality Date   EXTRACORPOREAL CIRCULATION     LASER ABLATION CONDOLAMATA N/A 10/24/2020   Procedure: LASER ABLATION of the cervix;  Surgeon: Lazaro Arms, MD;  Location: [facility];  Service: Gynecology;   Laterality: N/A;    Current Medications: Current Meds  Medication Sig   etonogestrel (NEXPLANON) 68 MG IMPL implant 1 each by Subdermal route once.   fludrocortisone (FLORINEF) 0.1 MG tablet TAKE 1 TABLET BY MOUTH 2 TIMES DAILY.   ondansetron (ZOFRAN ODT) 8 MG disintegrating tablet Take 1 tablet (8 mg total) by mouth every 8 (eight) hours as needed for nausea or vomiting.    Allergies:   Patient has no known allergies.   Social History   Socioeconomic History   Marital status: Legally Separated    Spouse name: Not on file   Number of children: Not on file   Years of education: Not on file   Highest education level: Not on file  Occupational History   Not on file  Tobacco Use   Smoking status: Never   Smokeless tobacco: Never  Vaping Use   Vaping Use: Former  Substance and Sexual Activity   Alcohol use: Yes    Comment: occ   Drug use: No   Sexual activity: Not Currently    Birth control/protection: Implant  Other Topics Concern   Not on file  Social History Narrative   Not on file   Social Determinants of Health   Financial Resource Strain: Low Risk    Difficulty of Paying Living Expenses: Not very hard  Food  Insecurity: No Food Insecurity   Worried About Programme researcher, broadcasting/film/video in the Last Year: Never true   Ran Out of Food in the Last Year: Never true  Transportation Needs: No Transportation Needs   Lack of Transportation (Medical): No   Lack of Transportation (Non-Medical): No  Physical Activity: Insufficiently Active   Days of Exercise per Week: 3 days   Minutes of Exercise per Session: 30 min  Stress: No Stress Concern Present   Feeling of Stress : Only a little  Social Connections: Socially Isolated   Frequency of Communication with Friends and Family: Three times a week   Frequency of Social Gatherings with Friends and Family: Twice a week   Attends Religious Services: Never   Database administrator or Organizations: No   Attends Hospital doctor: Never   Marital Status: Separated     Family History:  The patient's family history includes Colon cancer in her maternal grandfather; Diabetes in her paternal grandfather.   ROS:   Please see the history of present illness.    ROS All other systems reviewed and are negative.  No flowsheet data found.     PHYSICAL EXAM:   VS:  BP 108/60 (BP Location: Right Arm, Patient Position: Sitting, Cuff Size: Normal)   Pulse 89   Ht 5\' 4"  (1.626 m)   Wt 174 lb (78.9 kg)   SpO2 96%   BMI 29.87 kg/m    GEN: Well nourished, well developed, in no acute distress  HEENT: normal  Neck: no JVD, carotid bruits, or masses Cardiac: RRR; no murmurs, rubs, or gallops,no edema.  Intact distal pulses bilaterally.  Respiratory:  clear to auscultation bilaterally, normal work of breathing GI: soft, nontender, nondistended, + BS MS: no deformity or atrophy  Skin: warm and dry, no rash Neuro:  Alert and Oriented x 3, Strength and sensation are intact Psych: euthymic mood, full affect  Wt Readings from Last 3 Encounters:  07/30/21 174 lb (78.9 kg)  06/22/21 171 lb (77.6 kg)  06/01/21 170 lb (77.1 kg)      Studies/Labs Reviewed:   EKG:  EKG is ordered today.  The ekg ordered today demonstrates NSR with no ST changes  Recent Labs: 08/28/2020: Magnesium 2.0; TSH 0.949 10/23/2020: ALT 10; BUN 8; Creatinine, Ser 0.82; Hemoglobin 13.6; Platelets 340; Potassium 3.5; Sodium 138   Lipid Panel No results found for: CHOL, TRIG, HDL, CHOLHDL, VLDL, LDLCALC, LDLDIRECT  Additional studies/ records that were reviewed today include:  Office visit notes from PCP    ASSESSMENT:    1. Postural orthostatic tachycardia syndrome   2. Atypical chest pain      PLAN:  In order of problems listed above:  POTS -She has had this diagnosis for some time and apparently per PCP note has been quite stable on fludrocortisone -She has not had any recent episodes of dizziness presyncope or syncope or  palpitations -Her BP is soft but does not drop any further on orthostatic BPs on exam today and heart rate is normal -Encouraged her to continue with fludrocortisone, liberalize sodium intake and drink at least 64 ounces of fluids a day while avoiding caffeine and alcohol -Also encouraged her to exercise daily at least for 30 minutes  2.  Atypical chest pain -This does not sound cardiac in nature -EKG is nonischemic -Check 2D echocardiogram and ETT and if normal no further work-up  Time Spent: 20 minutes total time of encounter, including 15 minutes spent in face-to-face  patient care on the date of this encounter. This time includes coordination of care and counseling regarding above mentioned problem list. Remainder of non-face-to-face time involved reviewing chart documents/testing relevant to the patient encounter and documentation in the medical record. I have independently reviewed documentation from referring provider  Medication Adjustments/Labs and Tests Ordered: Current medicines are reviewed at length with the patient today.  Concerns regarding medicines are outlined above.  Medication changes, Labs and Tests ordered today are listed in the Patient Instructions below.  There are no Patient Instructions on file for this visit.   Signed, Armanda Magic, MD  07/30/2021 1:48 PM    [facility] [address] [place], [place], [place]  [address] Phone: [phone]; Fax: [phone]

## 2021-07-30 NOTE — Patient Instructions (Addendum)
Make sure you are drinking at least 64 oz of fluids daily  Liberalize your sodium intake Avoid all caffeine and alcohol  Exercise for at least 30 minutes 5 days per week  Medication Instructions:  Your physician recommends that you continue on your current medications as directed. Please refer to the Current Medication list given to you today.  *If you need a refill on your cardiac medications before your next appointment, please call your pharmacy*   Testing/Procedures: Your physician has requested that you have an echocardiogram. Echocardiography is a painless test that uses sound waves to create images of your heart. It provides your doctor with information about the size and shape of your heart and how well your heart's chambers and valves are working. This procedure takes approximately one hour. There are no restrictions for this procedure.  Your physician has requested that you have an exercise tolerance test. For further information please visit [email or web address] Please also follow instruction sheet, as given.  Follow-Up: At [facility], you and your health needs are our priority.  As part of our continuing mission to provide you with exceptional heart care, we have created designated Provider Care Teams.  These Care Teams include your primary Cardiologist (physician) and Advanced Practice Providers (APPs -  Physician Assistants and Nurse Practitioners) who all work together to provide you with the care you need, when you need it.  Follow up with Dr. Mayford Knife as needed based on results of testing.

## 2021-08-06 ENCOUNTER — Other Ambulatory Visit: Payer: Self-pay

## 2021-08-06 ENCOUNTER — Ambulatory Visit (INDEPENDENT_AMBULATORY_CARE_PROVIDER_SITE_OTHER): Payer: 59 | Admitting: Psychology

## 2021-08-06 ENCOUNTER — Ambulatory Visit: Payer: 59 | Admitting: Physical Therapy

## 2021-08-06 DIAGNOSIS — R252 Cramp and spasm: Secondary | ICD-10-CM | POA: Diagnosis not present

## 2021-08-06 DIAGNOSIS — F319 Bipolar disorder, unspecified: Secondary | ICD-10-CM

## 2021-08-06 DIAGNOSIS — R279 Unspecified lack of coordination: Secondary | ICD-10-CM

## 2021-08-06 DIAGNOSIS — M6281 Muscle weakness (generalized): Secondary | ICD-10-CM

## 2021-08-06 DIAGNOSIS — R69 Illness, unspecified: Secondary | ICD-10-CM | POA: Diagnosis not present

## 2021-08-06 NOTE — Therapy (Signed)
[facility] [address] [place], [place], [address] Phone: [phone]   Fax:  [phone]  Physical Therapy Treatment  Patient Details  Name: Victoria [name] MRN: [id number] Date of Birth: 1996-02-11 Referring Provider (PT): Cheral Marker, [place]   Encounter Date: 08/06/2021   PT End of Session - 08/06/21 0936     Visit Number 13    Date for PT Re-Evaluation 08/25/21    Authorization Type AETNA    Authorization - Visit Number 13    PT Start Time 0849    PT Stop Time 0929    PT Time Calculation (min) 40 min    Activity Tolerance Patient tolerated treatment well    Behavior During Therapy [facility] for tasks assessed/performed             Past Medical History:  Diagnosis Date   Abnormal Papanicolaou smear of cervix with positive human papilloma virus (HPV) test 09/08/2020   Pap was LSIL +HPV:  Needs colpo per ASCCP guidelines,  CIN 3+ immediate risk is 4.3%______________   Bipolar disorder (HCC)    POTS (postural orthostatic tachycardia syndrome)     Past Surgical History:  Procedure Laterality Date   EXTRACORPOREAL CIRCULATION     LASER ABLATION CONDOLAMATA N/A 10/24/2020   Procedure: LASER ABLATION of the cervix;  Surgeon: Lazaro Arms, MD;  Location: [facility];  Service: Gynecology;  Laterality: N/A;    There were no vitals filed for this visit.   Subjective Assessment - 08/06/21 0853     Subjective Pt reports she has improved to size 4 dilator now without pain.    How long can you sit comfortably? no limitations    How long can you stand comfortably? no limitations    How long can you walk comfortably? no limitations    Patient Stated Goals to have less pain and tolerate vaginal intercourse and tolerate tampon vs menstrual disc/cup    Currently in Pain? No/denies                               [facility] Adult PT Treatment/Exercise - 08/06/21 0001       Exercises   Exercises  Knee/Hip;Lumbar      Lumbar Exercises: Stretches   Other Lumbar Stretch Exercise childs pose 45s, quad needle threaders 45s; v-sit and butterfly sit 45sx    Other Lumbar Stretch Exercise half kneel with hip IR bil X45s each; lateral trunk lean in half kneel x45s each      Lumbar Exercises: Aerobic   Elliptical 5 mins L2      Lumbar Exercises: Standing   Functional Squats 10 reps    Functional Squats Limitations x2 6# DB    Other Standing Lumbar Exercises standing palloffs 6# x10      Lumbar Exercises: Seated   Other Seated Lumbar Exercises lumbar ball roll outs x10 foward and bil    Other Seated Lumbar Exercises V-sit 45s      Lumbar Exercises: Quadruped   Madcat/Old Horse 10 reps    Opposite Arm/Leg Raise Right arm/Left leg;Left arm/Right leg;10 reps                     PT Education - 08/06/21 0935     Education Details Pt educated on continued dilator use at home and options for progression as size 4 is last in her set; pt reports visual feedback helpful  at home and was able to do this well and deferred attempting this today as she was on her period.    Person(s) Educated Patient    Methods Explanation;Demonstration;Tactile cues;Verbal cues    Comprehension Verbalized understanding;Returned demonstration              PT Short Term Goals - 06/12/21 1023       PT SHORT TERM GOAL #1   Title Pt to be I with HEP    Time 6    Period Weeks    Status On-going    Target Date 06/05/21      PT SHORT TERM GOAL #2   Title pt to demonstrated improved pain with vaginal penetration to no more than 4/10 with size 2 dialator to equivalent sizing for progression to tolerate vaginal penetration.    Time 6    Period Weeks    Status Achieved    Target Date 06/05/21      PT SHORT TERM GOAL #3   Title pt to demonstrate I ability to self correct and complete diaphragmatic breathing technique to promote improved pelvic relaxation    Time 6    Period Weeks    Status  Achieved    Target Date 06/05/21               PT Long Term Goals - 04/24/21 1041       PT LONG TERM GOAL #1   Title pt to be I with advanced HEP    Time 4    Period Months    Status New    Target Date 08/25/21      PT LONG TERM GOAL #2   Title pt to demonstrate improved bil hip strength to at least 5/5 globally for improved functional mobility and decreased compensatory strategies    Time 4    Period Months    Status New    Target Date 08/25/21      PT LONG TERM GOAL #3   Title pt to report no more than 2/10 pain with size 4 vaginal dialator or size equivalent for progression to tolerate vaginal penetration    Time 4    Period Months    Status New    Target Date 08/25/21                   Plan - 08/06/21 0936     Clinical Impression Statement Pt presents to clininc reporting only very mild pelvic pain has greatly improved overall and now using dilator size 4 without pain and able to complete with visual feedback relax/contract at home with good effect per pt but deferred to attempt this this session due to being on period. Pt session focused on hip and core stretching and strengthening with pt reporting some muscle spasms at Lt thigh/knee but no pain. Pt tolerated well and benefited from cues for technique and breathing mechanics to improve pelvic stability and decrease strain/tightness with mobility. Pt would benefit from contiued PT and addition of aquatic therapy to improve hip and pelvic relaxation to increase tolerance to vaginal penetration without pain.    Personal Factors and Comorbidities Time since onset of injury/illness/exacerbation;Fitness    Examination-Activity Limitations Other    Examination-Participation Restrictions Interpersonal Relationship    Stability/Clinical Decision Making Evolving/Moderate complexity    Rehab Potential Good    PT Frequency 1x / week    PT Duration 12 weeks    PT Treatment/Interventions ADLs/Self Care Home  Management;Functional mobility training;Therapeutic activities;Therapeutic  exercise;Manual techniques;Passive range of motion;Dry needling;Taping;Patient/family education;Neuromuscular re-education;Energy conservation;Aquatic Therapy    PT Next Visit Plan internal for improved relaxation techniques    PT Home Exercise Plan Access Code [email or web address]    Consulted and Agree with Plan of Care Patient             Patient will benefit from skilled therapeutic intervention in order to improve the following deficits and impairments:  Decreased coordination, Decreased range of motion, Decreased endurance, Decreased strength, Decreased mobility, Postural dysfunction, Improper body mechanics, Impaired flexibility, Pain  Visit Diagnosis: Cramp and spasm  Muscle weakness (generalized)  Lack of coordination     Problem List Patient Active Problem List   Diagnosis Date Noted   Chest pain 06/22/2021   Anxiety with depression 06/22/2021   Chronic nonintractable headache 06/22/2021   Abnormal Pap smear of cervix 09/08/2020   Nexplanon in place 08/28/2020   Major depressive disorder, recurrent, severe without psychotic features (HCC) 07/01/2018   Generalized anxiety disorder 07/01/2018   Moderate episode of recurrent major depressive disorder (HCC) 04/11/2018   Major depression, recurrent (HCC) 01/07/2014   Autonomic dysreflexia 10/24/2012   Vasovagal near syncope 05/24/2012   Postural orthostatic tachycardia syndrome 08/27/2011  Otelia Sergeant, PT, DPT 10/27/229:40 AM   [facility] @ [facility] [address] [place], [place], [address] Phone: [phone]   Fax:  [phone]  Name: Victoria [name] MRN: [id number] Date of Birth: May 09, 1996

## 2021-08-13 ENCOUNTER — Other Ambulatory Visit: Payer: Self-pay

## 2021-08-13 ENCOUNTER — Ambulatory Visit (INDEPENDENT_AMBULATORY_CARE_PROVIDER_SITE_OTHER): Payer: 59 | Admitting: Licensed Clinical Social Worker

## 2021-08-13 DIAGNOSIS — Z91199 Patient's noncompliance with other medical treatment and regimen due to unspecified reason: Secondary | ICD-10-CM

## 2021-08-14 ENCOUNTER — Other Ambulatory Visit: Payer: Self-pay

## 2021-08-14 ENCOUNTER — Encounter: Payer: Self-pay | Admitting: Physical Therapy

## 2021-08-14 ENCOUNTER — Ambulatory Visit: Payer: 59 | Attending: Women's Health | Admitting: Physical Therapy

## 2021-08-14 DIAGNOSIS — M6281 Muscle weakness (generalized): Secondary | ICD-10-CM | POA: Insufficient documentation

## 2021-08-14 DIAGNOSIS — R252 Cramp and spasm: Secondary | ICD-10-CM | POA: Diagnosis not present

## 2021-08-14 DIAGNOSIS — R2689 Other abnormalities of gait and mobility: Secondary | ICD-10-CM | POA: Insufficient documentation

## 2021-08-14 DIAGNOSIS — R293 Abnormal posture: Secondary | ICD-10-CM | POA: Diagnosis not present

## 2021-08-14 DIAGNOSIS — R279 Unspecified lack of coordination: Secondary | ICD-10-CM | POA: Insufficient documentation

## 2021-08-14 NOTE — Therapy (Signed)
[facility] [address] [place], [place], [address] Phone: [phone]   Fax:  [phone]  Physical Therapy Treatment  Patient Details  Name: Victoria [name] MRN: [id number] Date of Birth: [birth year] Referring Provider (PT): Cheral Marker, [place]   Encounter Date: 08/14/2021   PT End of Session - 08/14/21 1523     Visit Number 14    Date for PT Re-Evaluation 08/25/21    Authorization Type AETNA    Authorization - Visit Number 14    PT Start Time 1345    PT Stop Time 1423    PT Time Calculation (min) 38 min    Activity Tolerance Patient tolerated treatment well    Behavior During Therapy [facility] for tasks assessed/performed             Past Medical History:  Diagnosis Date   Abnormal Papanicolaou smear of cervix with positive human papilloma virus (HPV) test 09/08/2020   Pap was LSIL +HPV:  Needs colpo per ASCCP guidelines,  CIN 3+ immediate risk is 4.3%______________   Bipolar disorder (HCC)    POTS (postural orthostatic tachycardia syndrome)     Past Surgical History:  Procedure Laterality Date   EXTRACORPOREAL CIRCULATION     LASER ABLATION CONDOLAMATA N/A 10/24/2020   Procedure: LASER ABLATION of the cervix;  Surgeon: Lazaro Arms, MD;  Location: [facility];  Service: Gynecology;  Laterality: N/A;    There were no vitals filed for this visit.   Subjective Assessment - 08/14/21 1522     Subjective No pain today. Pt excited to exercise in the water today.    Currently in Pain? No/denies           Treatment: Patient seen for aquatic therapy today.  Treatment took place in water 2.5-4 feet deep depending upon activity.  Pt entered the pool via steps, mild use of hand rails. Pt requires buoyancy of water for support and to offload joints with strengthening exercises and relaxation for pelvic floor.Water temp 95 degrees F.   Seated water bench with 75% submersion Pt performed seated LE AROM exercises 20x in  all planes, pain assessment concurrent.  Water walking in 75% depth 4 lengths in each direction. Hip 3 ways Bil with mild UE support for balance 10x, PPT against the wall with core compressions 5 sec hold 10x, hamstring and adductor stretching on second step 30 sec each with ankle DF/PF. Seated decompression with large noodle behind patient f/b 45 sec bicycle and 1 min rest: 3 bouts of this.                              PT Education - 08/14/21 1522     Education Details Water principles    Person(s) Educated Patient    Methods Explanation              PT Short Term Goals - 06/12/21 1023       PT SHORT TERM GOAL #1   Title Pt to be I with HEP    Time 6    Period Weeks    Status On-going    Target Date 06/05/21      PT SHORT TERM GOAL #2   Title pt to demonstrated improved pain with vaginal penetration to no more than 4/10 with size 2 dialator to equivalent sizing for progression to tolerate vaginal penetration.    Time 6    Period  Weeks    Status Achieved    Target Date 06/05/21      PT SHORT TERM GOAL #3   Title pt to demonstrate I ability to self correct and complete diaphragmatic breathing technique to promote improved pelvic relaxation    Time 6    Period Weeks    Status Achieved    Target Date 06/05/21               PT Long Term Goals - 04/24/21 1041       PT LONG TERM GOAL #1   Title pt to be I with advanced HEP    Time 4    Period Months    Status New    Target Date 08/25/21      PT LONG TERM GOAL #2   Title pt to demonstrate improved bil hip strength to at least 5/5 globally for improved functional mobility and decreased compensatory strategies    Time 4    Period Months    Status New    Target Date 08/25/21      PT LONG TERM GOAL #3   Title pt to report no more than 2/10 pain with size 4 vaginal dialator or size equivalent for progression to tolerate vaginal penetration    Time 4    Period Months    Status New     Target Date 08/25/21                   Plan - 08/14/21 1524     Clinical Impression Statement Pt arrives for first aquatic PT session. Pt denies pain. Pt was educated Art gallery manager and how we would use them. Pt was able to perform all foundational exercises with no pain or excessive fatigue.    Personal Factors and Comorbidities Time since onset of injury/illness/exacerbation;Fitness    Examination-Activity Limitations Other    Examination-Participation Restrictions Interpersonal Relationship    Stability/Clinical Decision Making Evolving/Moderate complexity    Rehab Potential Good    PT Frequency 1x / week    PT Duration 12 weeks    PT Treatment/Interventions ADLs/Self Care Home Management;Functional mobility training;Therapeutic activities;Therapeutic exercise;Manual techniques;Passive range of motion;Dry needling;Taping;Patient/family education;Neuromuscular re-education;Energy conservation;Aquatic Therapy    PT Next Visit Plan internal for improved relaxation techniques    PT Home Exercise Plan Access Code [email or web address]    Consulted and Agree with Plan of Care Patient             Patient will benefit from skilled therapeutic intervention in order to improve the following deficits and impairments:  Decreased coordination, Decreased range of motion, Decreased endurance, Decreased strength, Decreased mobility, Postural dysfunction, Improper body mechanics, Impaired flexibility, Pain  Visit Diagnosis: Cramp and spasm  Muscle weakness (generalized)  Lack of coordination  Other abnormalities of gait and mobility  Abnormal posture  Unspecified lack of coordination     Problem List Patient Active Problem List   Diagnosis Date Noted   Chest pain 06/22/2021   Anxiety with depression 06/22/2021   Chronic nonintractable headache 06/22/2021   Abnormal Pap smear of cervix 09/08/2020   Nexplanon in place 08/28/2020   Major depressive disorder, recurrent, severe  without psychotic features (HCC) 07/01/2018   Generalized anxiety disorder 07/01/2018   Moderate episode of recurrent major depressive disorder (HCC) 04/11/2018   Major depression, recurrent (HCC) 01/07/2014   Autonomic dysreflexia 10/24/2012   Vasovagal near syncope 05/24/2012   Postural orthostatic tachycardia syndrome 08/27/2011    Dorethia Jeanmarie, PTA 08/14/2021, 3:26 PM  [facility] [address] [place], [place], [address] Phone: [phone]   Fax:  [phone]  Name: MISHAYLA SLIWINSKI MRN: [id number] Date of Birth: 08-31-1996

## 2021-08-20 ENCOUNTER — Ambulatory Visit (INDEPENDENT_AMBULATORY_CARE_PROVIDER_SITE_OTHER): Payer: 59 | Admitting: Psychology

## 2021-08-20 DIAGNOSIS — F319 Bipolar disorder, unspecified: Secondary | ICD-10-CM

## 2021-08-20 DIAGNOSIS — R69 Illness, unspecified: Secondary | ICD-10-CM | POA: Diagnosis not present

## 2021-08-21 ENCOUNTER — Other Ambulatory Visit: Payer: Self-pay

## 2021-08-21 ENCOUNTER — Ambulatory Visit: Payer: 59 | Admitting: Physical Therapy

## 2021-08-21 DIAGNOSIS — R293 Abnormal posture: Secondary | ICD-10-CM | POA: Diagnosis not present

## 2021-08-21 DIAGNOSIS — M6281 Muscle weakness (generalized): Secondary | ICD-10-CM

## 2021-08-21 DIAGNOSIS — R279 Unspecified lack of coordination: Secondary | ICD-10-CM

## 2021-08-21 DIAGNOSIS — R252 Cramp and spasm: Secondary | ICD-10-CM

## 2021-08-21 DIAGNOSIS — R2689 Other abnormalities of gait and mobility: Secondary | ICD-10-CM | POA: Diagnosis not present

## 2021-08-21 NOTE — Therapy (Signed)
[facility] @ [facility] [address] [place], [place], [address] Phone: [phone]   Fax:  [phone]  Physical Therapy Treatment  Patient Details  Name: Victoria [name] MRN: [id number] Date of Birth: January 01, 1996 Referring Provider (PT): Cheral Marker, [place]   Encounter Date: 08/21/2021   PT End of Session - 08/21/21 0930     Visit Number 15    Date for PT Re-Evaluation 11/21/21   recert   Authorization Type AETNA    Authorization - Visit Number 15    PT Start Time 0851   arrival time   PT Stop Time 0930    PT Time Calculation (min) 39 min    Activity Tolerance Patient tolerated treatment well    Behavior During Therapy [facility] for tasks assessed/performed             Past Medical History:  Diagnosis Date   Abnormal Papanicolaou smear of cervix with positive human papilloma virus (HPV) test 09/08/2020   Pap was LSIL +HPV:  Needs colpo per ASCCP guidelines,  CIN 3+ immediate risk is 4.3%______________   Bipolar disorder (HCC)    POTS (postural orthostatic tachycardia syndrome)     Past Surgical History:  Procedure Laterality Date   EXTRACORPOREAL CIRCULATION     LASER ABLATION CONDOLAMATA N/A 10/24/2020   Procedure: LASER ABLATION of the cervix;  Surgeon: Lazaro Arms, MD;  Location: [facility];  Service: Gynecology;  Laterality: N/A;    There were no vitals filed for this visit.   Subjective Assessment - 08/21/21 0852     Subjective Pt reports she liked the pool and thinks it helps her relax, now using size 4 dilator without pain with insertion or mobility. Pt trying to decide if she wants to purchase larger set.    How long can you sit comfortably? no limitations    How long can you stand comfortably? no limitations    How long can you walk comfortably? no limitations    Patient Stated Goals to have less pain and tolerate vaginal intercourse and tolerate tampon vs menstrual disc/cup    Currently in Pain? No/denies                 [facility] PT Assessment - 08/21/21 0001       Assessment   Medical Diagnosis N94.10 (ICD-10-CM) - Dyspareunia, female  N94.2 (ICD-10-CM) - Vaginismus    Referring Provider (PT) Cheral Marker, CNM    Prior Therapy none             No emotional/communication barriers or cognitive limitation. Patient is motivated to learn. Patient understands and agrees with treatment goals and plan. PT explains patient will be examined in standing, sitting, and lying down to see how their muscles and joints work. When they are ready, they will be asked to remove their underwear so PT can examine their perineum. The patient is also given the option of providing their own chaperone as one is not provided in our facility. The patient also has the right and is explained the right to defer or refuse any part of the evaluation or treatment including the internal exam. With the patient's consent, PT will use one gloved finger to gently assess the muscles of the pelvic floor, seeing how well it contracts and relaxes and if there is muscle symmetry. After, the patient will get dressed and PT and patient will discuss exam findings and plan of care. PT and patient discuss plan of care,  schedule, attendance policy and HEP activities.            Pelvic Floor Special Questions - 08/21/21 0001     Pelvic Floor Internal Exam patient identified and patient confirms consent for PT to perform inernal soft tissue work and muscle strength and integrity assessment    Exam Type Vaginal    Palpation no pain throughout treatment or with insertion of gloved digit.    Strength fair squeeze, definite lift    Strength # of reps 10    Strength # of seconds 2    Tone WFLwith pain               [facility] Adult PT Treatment/Exercise - 08/21/21 0001       Neuro Re-ed    Neuro Re-ed Details  Pt directed in 3x10 pelvic contract/relaxations with PT providing internal feedback and cues to complete with pt  demonstrating greatly improved coodrination with first set. However second two sets pt reported "I think I'm over thinking it"  and demonstrated infrequent ability to coordinate contract/relax and sometimes bulge with attempting to contract. Pt was able to demonstrate increased reps at 3/5 strength in first set and ability to hold for 2s each time.      Manual Therapy   Manual Therapy Myofascial release;Internal Pelvic Floor    Manual therapy comments gentle stretching provided on Lt side of pelvis, no tightness noted at Rt side; to improve tissue relaxation for improved ability to complete NMRE training.                     PT Education - 08/21/21 0930     Education Details Pt educated on techniques to visualize contract/relax at home and continued use of dilator for increased tolerance to intercourse sizing as able    Person(s) Educated Patient    Methods Explanation;Demonstration;Tactile cues;Verbal cues    Comprehension Verbalized understanding;Returned demonstration              PT Short Term Goals - 08/21/21 1020       PT SHORT TERM GOAL #1   Title Pt to be I with HEP    Time 6    Period Weeks    Status On-going    Target Date 06/05/21      PT SHORT TERM GOAL #2   Title pt to demonstrated improved pain with vaginal penetration to no more than 4/10 with size 2 dialator to equivalent sizing for progression to tolerate vaginal penetration.    Time 6    Period Weeks    Status Achieved    Target Date 06/05/21      PT SHORT TERM GOAL #3   Title pt to demonstrate I ability to self correct and complete diaphragmatic breathing technique to promote improved pelvic relaxation    Time 6    Period Weeks    Status Achieved    Target Date 06/05/21               PT Long Term Goals - 08/21/21 1020       PT LONG TERM GOAL #1   Title pt to be I with advanced HEP    Time 4    Period Months    Status On-going      PT LONG TERM GOAL #2   Title pt to  demonstrate improved bil hip strength to at least 5/5 globally for improved functional mobility and decreased compensatory strategies   4/5 in extension and  abduction; 5/5 all else   Time 4    Period Months    Status On-going      PT LONG TERM GOAL #3   Title pt to report no more than 2/10 pain with size 4 vaginal dialator or size equivalent for progression to tolerate vaginal penetration   0/10   Time 4    Period Months    Status Achieved      PT LONG TERM GOAL #4   Title pt to report no more than 2/10 pain with size 6 vaginal dialator or size equivalent for progression to tolerate vaginal penetration and intercourse    Time 3    Period Months    Status New    Target Date 11/21/21      PT LONG TERM GOAL #5   Title pt to demonstrate ability to contract pelvic floor at 4/5 stretch consistently and isometric holds for 7s and full relaxation between reps to improve pelvic stability and proper mobility    Time 3    Period Months    Status New    Target Date 11/21/21                   Plan - 08/21/21 1008     Clinical Impression Statement Pt come to session without pain and able to now use dilator size 4 without pain. Pt enjoyed aquatic session and felt this is helping her relax pelvic floor and lower tone. Pt session focused on internal treatment with emphasis being on coordination of breathing mechanics with contract/relax of PF. Pt demonstrated slight increased tone with initial insertion of gloved digit but with short time and gentle stretch at Rt and Lt and x5 diaphragmatic breaths, pt able to fully relax. Pt then directed in x10 PF contractions and relax with good ability to complete at 3/5 strength and full relax between each rep and holds for 2s. However after this pt demonstrated decreased coordination and inconsistent proper technique, sometimes no contraction and sometimes bulge instead of upward contraction. Pt able to tell each time that it was incorrect, improved with  straw/balloon breathing technique and to activate core with contraction however without this inconsistency continued. Pt educated on ways to attempt this at home with visual feedback to improve coordination and to continue with dilator sizing progression as tolerated.    Personal Factors and Comorbidities Time since onset of injury/illness/exacerbation;Fitness    Examination-Activity Limitations Other    Examination-Participation Restrictions Interpersonal Relationship    Stability/Clinical Decision Making Evolving/Moderate complexity    Clinical Decision Making Moderate    Rehab Potential Good    PT Frequency 1x / week    PT Duration 12 weeks    PT Treatment/Interventions ADLs/Self Care Home Management;Functional mobility training;Therapeutic activities;Therapeutic exercise;Manual techniques;Passive range of motion;Dry needling;Taping;Patient/family education;Neuromuscular re-education;Energy conservation;Aquatic Therapy    PT Next Visit Plan internal for improved relaxation techniques vs hamstring,glute,adductor,trunk mobility and strength    PT Home Exercise Plan Access Code [email or web address]    Consulted and Agree with Plan of Care Patient             Patient will benefit from skilled therapeutic intervention in order to improve the following deficits and impairments:  Decreased coordination, Decreased range of motion, Decreased endurance, Decreased strength, Decreased mobility, Postural dysfunction, Improper body mechanics, Impaired flexibility, Pain  Visit Diagnosis: Muscle weakness (generalized) - Plan: PT plan of care cert/re-cert  Lack of coordination - Plan: PT plan of care cert/re-cert  Cramp and spasm - Plan:  PT plan of care cert/re-cert     Problem List Patient Active Problem List   Diagnosis Date Noted   Chest pain 06/22/2021   Anxiety with depression 06/22/2021   Chronic nonintractable headache 06/22/2021   Abnormal Pap smear of cervix 09/08/2020   Nexplanon in place  08/28/2020   Major depressive disorder, recurrent, severe without psychotic features (HCC) 07/01/2018   Generalized anxiety disorder 07/01/2018   Moderate episode of recurrent major depressive disorder (HCC) 04/11/2018   Major depression, recurrent (HCC) 01/07/2014   Autonomic dysreflexia 10/24/2012   Vasovagal near syncope 05/24/2012   Postural orthostatic tachycardia syndrome 08/27/2011   Otelia Sergeant, PT, DPT 08/21/2209:25 AM   [facility] @ [facility] [address] [place], [place], [address] Phone: [phone]   Fax:  [phone]  Name: Victoria [name] MRN: [id number] Date of Birth: 12/07/1995

## 2021-08-28 ENCOUNTER — Encounter: Payer: Self-pay | Admitting: Physical Therapy

## 2021-08-28 ENCOUNTER — Ambulatory Visit: Payer: 59 | Admitting: Physical Therapy

## 2021-08-28 ENCOUNTER — Other Ambulatory Visit: Payer: Self-pay

## 2021-08-28 DIAGNOSIS — M6281 Muscle weakness (generalized): Secondary | ICD-10-CM

## 2021-08-28 DIAGNOSIS — R279 Unspecified lack of coordination: Secondary | ICD-10-CM | POA: Diagnosis not present

## 2021-08-28 DIAGNOSIS — R252 Cramp and spasm: Secondary | ICD-10-CM

## 2021-08-28 DIAGNOSIS — R2689 Other abnormalities of gait and mobility: Secondary | ICD-10-CM

## 2021-08-28 DIAGNOSIS — R293 Abnormal posture: Secondary | ICD-10-CM

## 2021-08-28 NOTE — Therapy (Signed)
[facility] @ [facility] [address] [place], [place], [address] Phone: [phone]   Fax:  [phone]  Physical Therapy Treatment  Patient Details  Name: Victoria [name] MRN: [id number] Date of Birth: 02/04/1996 Referring Provider (PT): Cheral Marker, [place]   Encounter Date: 08/28/2021   PT End of Session - 08/28/21 1439     Visit Number 16    Date for PT Re-Evaluation 11/21/21    Authorization Type AETNA    Authorization - Visit Number 16    PT Start Time 1345    PT Stop Time 1425    PT Time Calculation (min) 40 min    Activity Tolerance Patient tolerated treatment well    Behavior During Therapy [facility] for tasks assessed/performed             Past Medical History:  Diagnosis Date   Abnormal Papanicolaou smear of cervix with positive human papilloma virus (HPV) test 09/08/2020   Pap was LSIL +HPV:  Needs colpo per ASCCP guidelines,  CIN 3+ immediate risk is 4.3%______________   Bipolar disorder (HCC)    POTS (postural orthostatic tachycardia syndrome)     Past Surgical History:  Procedure Laterality Date   EXTRACORPOREAL CIRCULATION     LASER ABLATION CONDOLAMATA N/A 10/24/2020   Procedure: LASER ABLATION of the cervix;  Surgeon: Lazaro Arms, MD;  Location: [facility];  Service: Gynecology;  Laterality: N/A;    There were no vitals filed for this visit.   Subjective Assessment - 08/28/21 1438     Subjective Pt reports one episode of thigh pain bil that made sleeping difficult this week, otherwise doing well. Stress high at this as she was let go from her job.    Currently in Pain? No/denies             Treatment: Patient seen for aquatic therapy today.  Treatment took place in water 2.5-4 feet deep depending upon activity.  Pt entered the pool via stairs with mild use of rails. Water temp 94 degrres F. Pt requires buoyancy of water for support and to offload joints with strengthening exercises.    Seated  water bench with 75% submersion Pt performed seated LE AROM exercises 20x in all planes, concurrent discussion of status.  75%-50% depth water walking 10x each side. Single leg stance LTLE 10 sec 5x, pt wobbly. High knee marching across the pool 6x using small noodle. Bil small hip circumdution 10x each direction. Post tilt against wall for core compressions with neck pillow 5 sec hold 10x, underwater bicycle 3 min 2 bouts, 1 min rest in between. 3 min seated decompression float for relaxation at end of session.                             PT Short Term Goals - 08/21/21 1020       PT SHORT TERM GOAL #1   Title Pt to be I with HEP    Time 6    Period Weeks    Status On-going    Target Date 06/05/21      PT SHORT TERM GOAL #2   Title pt to demonstrated improved pain with vaginal penetration to no more than 4/10 with size 2 dialator to equivalent sizing for progression to tolerate vaginal penetration.    Time 6    Period Weeks    Status Achieved    Target Date 06/05/21  PT SHORT TERM GOAL #3   Title pt to demonstrate I ability to self correct and complete diaphragmatic breathing technique to promote improved pelvic relaxation    Time 6    Period Weeks    Status Achieved    Target Date 06/05/21               PT Long Term Goals - 08/21/21 1020       PT LONG TERM GOAL #1   Title pt to be I with advanced HEP    Time 4    Period Months    Status On-going      PT LONG TERM GOAL #2   Title pt to demonstrate improved bil hip strength to at least 5/5 globally for improved functional mobility and decreased compensatory strategies   4/5 in extension and abduction; 5/5 all else   Time 4    Period Months    Status On-going      PT LONG TERM GOAL #3   Title pt to report no more than 2/10 pain with size 4 vaginal dialator or size equivalent for progression to tolerate vaginal penetration   0/10   Time 4    Period Months    Status Achieved      PT  LONG TERM GOAL #4   Title pt to report no more than 2/10 pain with size 6 vaginal dialator or size equivalent for progression to tolerate vaginal penetration and intercourse    Time 3    Period Months    Status New    Target Date 11/21/21      PT LONG TERM GOAL #5   Title pt to demonstrate ability to contract pelvic floor at 4/5 stretch consistently and isometric holds for 7s and full relaxation between reps to improve pelvic stability and proper mobility    Time 3    Period Months    Status New    Target Date 11/21/21                   Plan - 08/28/21 1440     Clinical Impression Statement Pt tolerates aquatic exercise very well, no pain or tightness reported. Balance on the LTLE is not as good as on the RT, pt working on improving that, otherwise no issues today.    Personal Factors and Comorbidities Time since onset of injury/illness/exacerbation;Fitness    Examination-Activity Limitations Other    Examination-Participation Restrictions Interpersonal Relationship    Stability/Clinical Decision Making Evolving/Moderate complexity    Rehab Potential Good    PT Frequency 1x / week    PT Duration 12 weeks    PT Treatment/Interventions ADLs/Self Care Home Management;Functional mobility training;Therapeutic activities;Therapeutic exercise;Manual techniques;Passive range of motion;Dry needling;Taping;Patient/family education;Neuromuscular re-education;Energy conservation;Aquatic Therapy    PT Next Visit Plan internal for improved relaxation techniques vs hamstring,glute,adductor,trunk mobility and strength    PT Home Exercise Plan Access Code [email or web address]    Consulted and Agree with Plan of Care Patient             Patient will benefit from skilled therapeutic intervention in order to improve the following deficits and impairments:  Decreased coordination, Decreased range of motion, Decreased endurance, Decreased strength, Decreased mobility, Postural dysfunction, Improper body  mechanics, Impaired flexibility, Pain  Visit Diagnosis: Muscle weakness (generalized)  Lack of coordination  Cramp and spasm  Other abnormalities of gait and mobility  Abnormal posture  Unspecified lack of coordination     Problem List Patient Active Problem List  Diagnosis Date Noted   Chest pain 06/22/2021   Anxiety with depression 06/22/2021   Chronic nonintractable headache 06/22/2021   Abnormal Pap smear of cervix 09/08/2020   Nexplanon in place 08/28/2020   Major depressive disorder, recurrent, severe without psychotic features (HCC) 07/01/2018   Generalized anxiety disorder 07/01/2018   Moderate episode of recurrent major depressive disorder (HCC) 04/11/2018   Major depression, recurrent (HCC) 01/07/2014   Autonomic dysreflexia 10/24/2012   Vasovagal near syncope 05/24/2012   Postural orthostatic tachycardia syndrome 08/27/2011    Deamonte Sayegh, PTA 08/28/2021, 3:43 PM  [facility] @ [facility] [address] [place], [place], [address] Phone: [phone]   Fax:  [phone]  Name: CHASEY DULL MRN: [id number] Date of Birth: 11/24/1995

## 2021-09-01 ENCOUNTER — Other Ambulatory Visit: Payer: Self-pay

## 2021-09-01 ENCOUNTER — Ambulatory Visit: Payer: 59 | Admitting: Physical Therapy

## 2021-09-01 DIAGNOSIS — R279 Unspecified lack of coordination: Secondary | ICD-10-CM | POA: Diagnosis not present

## 2021-09-01 DIAGNOSIS — R252 Cramp and spasm: Secondary | ICD-10-CM | POA: Diagnosis not present

## 2021-09-01 DIAGNOSIS — R2689 Other abnormalities of gait and mobility: Secondary | ICD-10-CM | POA: Diagnosis not present

## 2021-09-01 DIAGNOSIS — M6281 Muscle weakness (generalized): Secondary | ICD-10-CM | POA: Diagnosis not present

## 2021-09-01 DIAGNOSIS — R293 Abnormal posture: Secondary | ICD-10-CM | POA: Diagnosis not present

## 2021-09-01 NOTE — Therapy (Signed)
[facility] [address] [place], [place], [address] Phone: [phone]   Fax:  [phone]  Physical Therapy Treatment  Patient Details  Name: Victoria [name] MRN: [id number] Date of Birth: May 03, 1996 Referring Provider (PT): Cheral Marker, [place]   Encounter Date: 09/01/2021   PT End of Session - 09/01/21 1442     Visit Number 17    Date for PT Re-Evaluation 11/21/21    Authorization Type AETNA    Authorization - Visit Number 16    PT Start Time 1400    PT Stop Time 1443    PT Time Calculation (min) 43 min    Activity Tolerance Patient tolerated treatment well    Behavior During Therapy [facility] for tasks assessed/performed             Past Medical History:  Diagnosis Date   Abnormal Papanicolaou smear of cervix with positive human papilloma virus (HPV) test 09/08/2020   Pap was LSIL +HPV:  Needs colpo per ASCCP guidelines,  CIN 3+ immediate risk is 4.3%______________   Bipolar disorder (HCC)    POTS (postural orthostatic tachycardia syndrome)     Past Surgical History:  Procedure Laterality Date   EXTRACORPOREAL CIRCULATION     LASER ABLATION CONDOLAMATA N/A 10/24/2020   Procedure: LASER ABLATION of the cervix;  Surgeon: Lazaro Arms, MD;  Location: [facility];  Service: Gynecology;  Laterality: N/A;    There were no vitals filed for this visit.   Subjective Assessment - 09/01/21 1400     Subjective Pt reports she feels she is about the same with pelvic tension and continues to use dilator 4 without pain but unable to decide how to increase whether new set or a different option.    How long can you sit comfortably? no limitations    How long can you stand comfortably? no limitations    How long can you walk comfortably? no limitations    Patient Stated Goals to have less pain and tolerate vaginal intercourse and tolerate tampon vs menstrual disc/cup    Currently in Pain? No/denies                                [facility] Adult PT Treatment/Exercise - 09/01/21 0001       Exercises   Exercises Lumbar;Knee/Hip      Lumbar Exercises: Stretches   Active Hamstring Stretch Right;Left;30 seconds;2 reps    Single Knee to Chest Stretch Right;Left;2 reps;30 seconds    Piriformis Stretch Right;Left;2 reps;30 seconds    Other Lumbar Stretch Exercise childs pose 45s, quad needle threaders 45s; v-sit and butterfly sit 45sx      Lumbar Exercises: Standing   Functional Squats 10 reps    Functional Squats Limitations x2 7# DB front; half squats    Forward Lunge 10 reps    Side Lunge 10 reps    Other Standing Lumbar Exercises standing palloffs blue band x10      Lumbar Exercises: Seated   Other Seated Lumbar Exercises adductor rocks x10 each    Other Seated Lumbar Exercises v -sit 45s      Lumbar Exercises: Quadruped   Madcat/Old Horse 10 reps    Opposite Arm/Leg Raise Right arm/Left leg;Left arm/Right leg;10 reps    Opposite Arm/Leg Raise Limitations with 3# DB    Other Quadruped Lumbar Exercises uneven pelvic quad hip shift 2x30s  PT Education - 09/01/21 1441     Education Details Pt educated on continuing HEP, increasing time between dilator use at home to self assess if same pain free flet between. Pt unsure if she can buy additional set of increased sized dilators at this time.    Person(s) Educated Patient    Methods Explanation;Demonstration;Tactile cues;Verbal cues    Comprehension Returned demonstration;Verbalized understanding              PT Short Term Goals - 08/21/21 1020       PT SHORT TERM GOAL #1   Title Pt to be I with HEP    Time 6    Period Weeks    Status On-going    Target Date 06/05/21      PT SHORT TERM GOAL #2   Title pt to demonstrated improved pain with vaginal penetration to no more than 4/10 with size 2 dialator to equivalent sizing for progression to tolerate vaginal penetration.    Time 6     Period Weeks    Status Achieved    Target Date 06/05/21      PT SHORT TERM GOAL #3   Title pt to demonstrate I ability to self correct and complete diaphragmatic breathing technique to promote improved pelvic relaxation    Time 6    Period Weeks    Status Achieved    Target Date 06/05/21               PT Long Term Goals - 08/21/21 1020       PT LONG TERM GOAL #1   Title pt to be I with advanced HEP    Time 4    Period Months    Status On-going      PT LONG TERM GOAL #2   Title pt to demonstrate improved bil hip strength to at least 5/5 globally for improved functional mobility and decreased compensatory strategies   4/5 in extension and abduction; 5/5 all else   Time 4    Period Months    Status On-going      PT LONG TERM GOAL #3   Title pt to report no more than 2/10 pain with size 4 vaginal dialator or size equivalent for progression to tolerate vaginal penetration   0/10   Time 4    Period Months    Status Achieved      PT LONG TERM GOAL #4   Title pt to report no more than 2/10 pain with size 6 vaginal dialator or size equivalent for progression to tolerate vaginal penetration and intercourse    Time 3    Period Months    Status New    Target Date 11/21/21      PT LONG TERM GOAL #5   Title pt to demonstrate ability to contract pelvic floor at 4/5 stretch consistently and isometric holds for 7s and full relaxation between reps to improve pelvic stability and proper mobility    Time 3    Period Months    Status New    Target Date 11/21/21                   Plan - 09/01/21 1443     Clinical Impression Statement Pt session focused on hip,pelvic and core strengthening and mobility. Pt reports she has been stressed and felt tighter but reports no pain with dilator size 4 (largest she has at home), and does continue to stretch with HEP daily. Pt tolerated session  well with improved mobility in overall compared to start of PT, increased tolerance to  activity, and increased strength overall. Pt did require intermittent min A for balance with squats, and half kneeling positions, no LOB but sway noted. Pt denied pain throughout and reported feeling better at end of session. Pt continues to demonstrate need of PT to address pelvic instability and tightness to improve QOL.    Personal Factors and Comorbidities Time since onset of injury/illness/exacerbation;Fitness    Examination-Activity Limitations Other    Examination-Participation Restrictions Interpersonal Relationship    Stability/Clinical Decision Making Evolving/Moderate complexity    Rehab Potential Good    PT Frequency 1x / week    PT Duration 12 weeks    PT Treatment/Interventions ADLs/Self Care Home Management;Functional mobility training;Therapeutic activities;Therapeutic exercise;Manual techniques;Passive range of motion;Dry needling;Taping;Patient/family education;Neuromuscular re-education;Energy conservation;Aquatic Therapy    PT Next Visit Plan internal for improved relaxation techniques vs hamstring,glute,adductor,trunk mobility and strength    PT Home Exercise Plan Access Code [email or web address]    Consulted and Agree with Plan of Care Patient             Patient will benefit from skilled therapeutic intervention in order to improve the following deficits and impairments:  Decreased coordination, Decreased range of motion, Decreased endurance, Decreased strength, Decreased mobility, Postural dysfunction, Improper body mechanics, Impaired flexibility, Pain  Visit Diagnosis: Muscle weakness (generalized)  Lack of coordination     Problem List Patient Active Problem List   Diagnosis Date Noted   Chest pain 06/22/2021   Anxiety with depression 06/22/2021   Chronic nonintractable headache 06/22/2021   Abnormal Pap smear of cervix 09/08/2020   Nexplanon in place 08/28/2020   Major depressive disorder, recurrent, severe without psychotic features (HCC) 07/01/2018    Generalized anxiety disorder 07/01/2018   Moderate episode of recurrent major depressive disorder (HCC) 04/11/2018   Major depression, recurrent (HCC) 01/07/2014   Autonomic dysreflexia 10/24/2012   Vasovagal near syncope 05/24/2012   Postural orthostatic tachycardia syndrome 08/27/2011    Otelia Sergeant, PT, DPT 11/22/222:46 PM   [facility] [address] [place], [place], [address] Phone: [phone]   Fax:  [phone]  Name: Victoria [name] MRN: [id number] Date of Birth: Feb 10, 1996

## 2021-09-02 ENCOUNTER — Other Ambulatory Visit: Payer: Self-pay | Admitting: Internal Medicine

## 2021-09-02 ENCOUNTER — Encounter: Payer: Self-pay | Admitting: Physical Therapy

## 2021-09-02 ENCOUNTER — Ambulatory Visit: Payer: 59 | Admitting: Physical Therapy

## 2021-09-02 DIAGNOSIS — R2689 Other abnormalities of gait and mobility: Secondary | ICD-10-CM | POA: Diagnosis not present

## 2021-09-02 DIAGNOSIS — M6281 Muscle weakness (generalized): Secondary | ICD-10-CM

## 2021-09-02 DIAGNOSIS — R252 Cramp and spasm: Secondary | ICD-10-CM | POA: Diagnosis not present

## 2021-09-02 DIAGNOSIS — R279 Unspecified lack of coordination: Secondary | ICD-10-CM

## 2021-09-02 DIAGNOSIS — G90A Postural orthostatic tachycardia syndrome (POTS): Secondary | ICD-10-CM

## 2021-09-02 DIAGNOSIS — R293 Abnormal posture: Secondary | ICD-10-CM

## 2021-09-02 NOTE — Therapy (Signed)
[facility] [address] [place], [place], [address] Phone: [phone]   Fax:  [phone]  Physical Therapy Treatment  Patient Details  Name: Victoria [name] MRN: [id number] Date of Birth: 12-28-1995 Referring Provider (PT): Cheral Marker, [place]   Encounter Date: 09/02/2021   PT End of Session - 09/02/21 1129     Visit Number 18    Date for PT Re-Evaluation 11/21/21    Authorization Type AETNA    PT Start Time 0930    PT Stop Time 1015    PT Time Calculation (min) 45 min    Activity Tolerance Patient tolerated treatment well    Behavior During Therapy [facility] for tasks assessed/performed             Past Medical History:  Diagnosis Date   Abnormal Papanicolaou smear of cervix with positive human papilloma virus (HPV) test 09/08/2020   Pap was LSIL +HPV:  Needs colpo per ASCCP guidelines,  CIN 3+ immediate risk is 4.3%______________   Bipolar disorder (HCC)    POTS (postural orthostatic tachycardia syndrome)     Past Surgical History:  Procedure Laterality Date   EXTRACORPOREAL CIRCULATION     LASER ABLATION CONDOLAMATA N/A 10/24/2020   Procedure: LASER ABLATION of the cervix;  Surgeon: Lazaro Arms, MD;  Location: [facility];  Service: Gynecology;  Laterality: N/A;    There were no vitals filed for this visit.   Subjective Assessment - 09/02/21 1128     Subjective No new complaints, denies pain.    Currently in Pain? No/denies             Treatment: Patient seen for aquatic therapy today.  Treatment took place in water 2.5-4 feet deep depending upon activity.  Pt entered the pool via stairs, reciprocally, mild use of rails. Pt requires buoyancy of water for support and to offload joints with strengthening exercises.  Pt utilizes viscosity of the water required for strengthening. Water temp 93 degrees F.  Seated water bench with 75% submersion Pt performed seated LE AROM exercises 20x in all planes,  concurrent discussion of current status.   75% submersion: water walks each direction 10x with large noodle push/pull.Core compressions with neck pillow 5 sec hold 10x, tandem stance Bil with UE should horizontal abd/add 20x on each side, Single leg stance RT 4x 10sec, LTLE many attempts, required assistance of small noodl eto help balance. Underwater bicycle 2 min with large noodle behind pt 3 sets 1 min rets break. Intermittent seated decompression hangs for PF relaxation.                              PT Short Term Goals - 08/21/21 1020       PT SHORT TERM GOAL #1   Title Pt to be I with HEP    Time 6    Period Weeks    Status On-going    Target Date 06/05/21      PT SHORT TERM GOAL #2   Title pt to demonstrated improved pain with vaginal penetration to no more than 4/10 with size 2 dialator to equivalent sizing for progression to tolerate vaginal penetration.    Time 6    Period Weeks    Status Achieved    Target Date 06/05/21      PT SHORT TERM GOAL #3   Title pt to demonstrate I ability to self correct and complete  diaphragmatic breathing technique to promote improved pelvic relaxation    Time 6    Period Weeks    Status Achieved    Target Date 06/05/21               PT Long Term Goals - 08/21/21 1020       PT LONG TERM GOAL #1   Title pt to be I with advanced HEP    Time 4    Period Months    Status On-going      PT LONG TERM GOAL #2   Title pt to demonstrate improved bil hip strength to at least 5/5 globally for improved functional mobility and decreased compensatory strategies   4/5 in extension and abduction; 5/5 all else   Time 4    Period Months    Status On-going      PT LONG TERM GOAL #3   Title pt to report no more than 2/10 pain with size 4 vaginal dialator or size equivalent for progression to tolerate vaginal penetration   0/10   Time 4    Period Months    Status Achieved      PT LONG TERM GOAL #4   Title pt to report  no more than 2/10 pain with size 6 vaginal dialator or size equivalent for progression to tolerate vaginal penetration and intercourse    Time 3    Period Months    Status New    Target Date 11/21/21      PT LONG TERM GOAL #5   Title pt to demonstrate ability to contract pelvic floor at 4/5 stretch consistently and isometric holds for 7s and full relaxation between reps to improve pelvic stability and proper mobility    Time 3    Period Months    Status New    Target Date 11/21/21                   Plan - 09/02/21 1130     Clinical Impression Statement Pt arrives painfree to aquatic therapy and no specific complaints today. Pt was able to balance single leg 10 sec no issues on the RTLE but required multiple attempts to improve to 8-10 sec. Tandem stance with UE movements getting more solid. Pt's command of her core also improving.    Personal Factors and Comorbidities Time since onset of injury/illness/exacerbation;Fitness    Examination-Activity Limitations Other    Stability/Clinical Decision Making Evolving/Moderate complexity    Rehab Potential Good    PT Frequency 1x / week    PT Duration 12 weeks    PT Treatment/Interventions ADLs/Self Care Home Management;Functional mobility training;Therapeutic activities;Therapeutic exercise;Manual techniques;Passive range of motion;Dry needling;Taping;Patient/family education;Neuromuscular re-education;Energy conservation;Aquatic Therapy    PT Next Visit Plan internal for improved relaxation techniques vs hamstring,glute,adductor,trunk mobility and strength    PT Home Exercise Plan Access Code [email or web address]    Consulted and Agree with Plan of Care Patient             Patient will benefit from skilled therapeutic intervention in order to improve the following deficits and impairments:  Decreased coordination, Decreased range of motion, Decreased endurance, Decreased strength, Decreased mobility, Postural dysfunction, Improper body  mechanics, Impaired flexibility, Pain  Visit Diagnosis: Muscle weakness (generalized)  Lack of coordination  Cramp and spasm  Other abnormalities of gait and mobility  Abnormal posture  Unspecified lack of coordination     Problem List Patient Active Problem List   Diagnosis Date Noted   Chest pain  06/22/2021   Anxiety with depression 06/22/2021   Chronic nonintractable headache 06/22/2021   Abnormal Pap smear of cervix 09/08/2020   Nexplanon in place 08/28/2020   Major depressive disorder, recurrent, severe without psychotic features (HCC) 07/01/2018   Generalized anxiety disorder 07/01/2018   Moderate episode of recurrent major depressive disorder (HCC) 04/11/2018   Major depression, recurrent (HCC) 01/07/2014   Autonomic dysreflexia 10/24/2012   Vasovagal near syncope 05/24/2012   Postural orthostatic tachycardia syndrome 08/27/2011    Lonzie Simmer, PTA 09/02/2021, 11:32 AM  [facility] [address] [place], [place], [address] Phone: [phone]   Fax:  [phone]  Name: Victoria [name] MRN: [id number] Date of Birth: Jun 03, 1996

## 2021-09-09 ENCOUNTER — Other Ambulatory Visit: Payer: Self-pay

## 2021-09-09 ENCOUNTER — Encounter: Payer: Self-pay | Admitting: Physical Therapy

## 2021-09-09 ENCOUNTER — Ambulatory Visit: Payer: 59 | Admitting: Physical Therapy

## 2021-09-09 DIAGNOSIS — R279 Unspecified lack of coordination: Secondary | ICD-10-CM

## 2021-09-09 DIAGNOSIS — M6281 Muscle weakness (generalized): Secondary | ICD-10-CM

## 2021-09-09 DIAGNOSIS — R2689 Other abnormalities of gait and mobility: Secondary | ICD-10-CM

## 2021-09-09 DIAGNOSIS — R293 Abnormal posture: Secondary | ICD-10-CM | POA: Diagnosis not present

## 2021-09-09 DIAGNOSIS — R252 Cramp and spasm: Secondary | ICD-10-CM | POA: Diagnosis not present

## 2021-09-09 NOTE — Therapy (Signed)
[facility] [address] [place], [place], [address] Phone: [phone]   Fax:  [phone]  Physical Therapy Treatment  Patient Details  Name: Victoria [name] MRN: [id number] Date of Birth: [birth year] Referring Provider (PT): Cheral Marker, [place]   Encounter Date: 09/09/2021   PT End of Session - 09/09/21 1057     Visit Number 19    Date for PT Re-Evaluation 11/21/21    Authorization Type AETNA    PT Start Time 0850    PT Stop Time 0935    PT Time Calculation (min) 45 min    Activity Tolerance Patient tolerated treatment well    Behavior During Therapy [facility] for tasks assessed/performed             Past Medical History:  Diagnosis Date   Abnormal Papanicolaou smear of cervix with positive human papilloma virus (HPV) test 09/08/2020   Pap was LSIL +HPV:  Needs colpo per ASCCP guidelines,  CIN 3+ immediate risk is 4.3%______________   Bipolar disorder (HCC)    POTS (postural orthostatic tachycardia syndrome)     Past Surgical History:  Procedure Laterality Date   EXTRACORPOREAL CIRCULATION     LASER ABLATION CONDOLAMATA N/A 10/24/2020   Procedure: LASER ABLATION of the cervix;  Surgeon: Lazaro Arms, MD;  Location: [facility];  Service: Gynecology;  Laterality: N/A;    There were no vitals filed for this visit.   Subjective Assessment - 09/09/21 1056     Subjective No new complaints    Currently in Pain? No/denies             Treatment: Patient seen for aquatic therapy today.  Treatment took place in water 2.5-4 feet deep depending upon activity.  Pt entered the pool via stairs, mild use of rails. Water temp 94 degrees F. Pt requires buoyancy of water for support and to offload joints with strengthening exercises.  Pt utilizes viscosity of the water required for strengthening.   Seated water bench with 75% submersion Pt performed seated LE AROM exercises 20x in all planes, discussion concurrent of  current status.  75% standing depth: water walking in all directions 10x using noodle to push/pull: Vc to increase speed of LE. Tandem stance: more narrow today close to heel/toe shape with concurrent double yellow UE wts for horizontal abd/add 20x on each side. Second set PTA provided exta current with kickboard to increase challenge. LTLE hip abd & circles 20x each with VC to increase speed for more resistance. Abdominal compressions with neck pillow 8 sec hold 10x, added marching to core compressions 6x along the pool; Single leg stance with small noodle for assistance; 15-20 sec 3x Bil. Seated decompression with large noodle behind patient. Intermittent LE AROM                              PT Short Term Goals - 08/21/21 1020       PT SHORT TERM GOAL #1   Title Pt to be I with HEP    Time 6    Period Weeks    Status On-going    Target Date 06/05/21      PT SHORT TERM GOAL #2   Title pt to demonstrated improved pain with vaginal penetration to no more than 4/10 with size 2 dialator to equivalent sizing for progression to tolerate vaginal penetration.    Time 6    Period Weeks  Status Achieved    Target Date 06/05/21      PT SHORT TERM GOAL #3   Title pt to demonstrate I ability to self correct and complete diaphragmatic breathing technique to promote improved pelvic relaxation    Time 6    Period Weeks    Status Achieved    Target Date 06/05/21               PT Long Term Goals - 08/21/21 1020       PT LONG TERM GOAL #1   Title pt to be I with advanced HEP    Time 4    Period Months    Status On-going      PT LONG TERM GOAL #2   Title pt to demonstrate improved bil hip strength to at least 5/5 globally for improved functional mobility and decreased compensatory strategies   4/5 in extension and abduction; 5/5 all else   Time 4    Period Months    Status On-going      PT LONG TERM GOAL #3   Title pt to report no more than 2/10 pain with  size 4 vaginal dialator or size equivalent for progression to tolerate vaginal penetration   0/10   Time 4    Period Months    Status Achieved      PT LONG TERM GOAL #4   Title pt to report no more than 2/10 pain with size 6 vaginal dialator or size equivalent for progression to tolerate vaginal penetration and intercourse    Time 3    Period Months    Status New    Target Date 11/21/21      PT LONG TERM GOAL #5   Title pt to demonstrate ability to contract pelvic floor at 4/5 stretch consistently and isometric holds for 7s and full relaxation between reps to improve pelvic stability and proper mobility    Time 3    Period Months    Status New    Target Date 11/21/21                   Plan - 09/09/21 1058     Clinical Impression Statement Pt able to balance in water today 15-20 on BILE which is an increase by 5 sec.    Personal Factors and Comorbidities Time since onset of injury/illness/exacerbation;Fitness    Examination-Activity Limitations Other    Examination-Participation Restrictions Interpersonal Relationship    Stability/Clinical Decision Making Evolving/Moderate complexity    Rehab Potential Good    PT Frequency 1x / week    PT Duration 12 weeks    PT Treatment/Interventions ADLs/Self Care Home Management;Functional mobility training;Therapeutic activities;Therapeutic exercise;Manual techniques;Passive range of motion;Dry needling;Taping;Patient/family education;Neuromuscular re-education;Energy conservation;Aquatic Therapy    PT Next Visit Plan internal for improved relaxation techniques vs hamstring,glute,adductor,trunk mobility and strength    PT Home Exercise Plan Access Code [email or web address]             Patient will benefit from skilled therapeutic intervention in order to improve the following deficits and impairments:     Visit Diagnosis: Muscle weakness (generalized)  Lack of coordination  Cramp and spasm  Abnormal posture  Unspecified lack of  coordination     Problem List Patient Active Problem List   Diagnosis Date Noted   Chest pain 06/22/2021   Anxiety with depression 06/22/2021   Chronic nonintractable headache 06/22/2021   Abnormal Pap smear of cervix 09/08/2020   Nexplanon in place 08/28/2020   Major  depressive disorder, recurrent, severe without psychotic features (HCC) 07/01/2018   Generalized anxiety disorder 07/01/2018   Moderate episode of recurrent major depressive disorder (HCC) 04/11/2018   Major depression, recurrent (HCC) 01/07/2014   Autonomic dysreflexia 10/24/2012   Vasovagal near syncope 05/24/2012   Postural orthostatic tachycardia syndrome 08/27/2011    Kirsty Monjaraz, PTA 09/09/2021, 11:21 AM  [facility] [address] [place], [place], [address] Phone: [phone]   Fax:  [phone]  Name: Victoria [name] MRN: [id number] Date of Birth: 1996-06-26

## 2021-09-10 ENCOUNTER — Ambulatory Visit (INDEPENDENT_AMBULATORY_CARE_PROVIDER_SITE_OTHER): Payer: 59 | Admitting: Psychology

## 2021-09-10 ENCOUNTER — Telehealth: Payer: 59

## 2021-09-10 DIAGNOSIS — F319 Bipolar disorder, unspecified: Secondary | ICD-10-CM | POA: Diagnosis not present

## 2021-09-10 DIAGNOSIS — R69 Illness, unspecified: Secondary | ICD-10-CM | POA: Diagnosis not present

## 2021-09-11 ENCOUNTER — Ambulatory Visit (HOSPITAL_BASED_OUTPATIENT_CLINIC_OR_DEPARTMENT_OTHER)
Admission: RE | Admit: 2021-09-11 | Discharge: 2021-09-11 | Disposition: A | Discharge status: Home / Self Care | Payer: 59 | Source: Ambulatory Visit | Attending: Cardiology | Admitting: Cardiology

## 2021-09-11 ENCOUNTER — Other Ambulatory Visit: Payer: Self-pay

## 2021-09-11 ENCOUNTER — Encounter: Payer: Self-pay | Admitting: Cardiology

## 2021-09-11 ENCOUNTER — Ambulatory Visit (HOSPITAL_COMMUNITY)
Admission: RE | Admit: 2021-09-11 | Discharge: 2021-09-11 | Disposition: A | Discharge status: Home / Self Care | Payer: 59 | Source: Ambulatory Visit | Attending: Cardiology | Admitting: Cardiology

## 2021-09-11 DIAGNOSIS — R0789 Other chest pain: Secondary | ICD-10-CM

## 2021-09-11 DIAGNOSIS — G90A Postural orthostatic tachycardia syndrome (POTS): Secondary | ICD-10-CM | POA: Insufficient documentation

## 2021-09-11 DIAGNOSIS — I341 Nonrheumatic mitral (valve) prolapse: Secondary | ICD-10-CM | POA: Insufficient documentation

## 2021-09-11 LAB — EXERCISE TOLERANCE TEST
Angina Index: 0
Duke Treadmill Score: 8
Estimated workload: 9.6
Exercise duration (min): 7 min
Exercise duration (sec): 34 s
MPHR: 193 {beats}/min
Peak HR: 184 {beats}/min
Percent HR: 95 %
RPE: 15
Rest HR: 85 {beats}/min
ST Depression (mm): 0 mm

## 2021-09-11 LAB — ECHOCARDIOGRAM COMPLETE
Area-P 1/2: 3.03 cm2
S' Lateral: 2.9 cm

## 2021-09-11 NOTE — Progress Notes (Signed)
*  PRELIMINARY RESULTS* Echocardiogram 2D Echocardiogram has been performed.  Stacey Drain 09/11/2021, 11:10 AM

## 2021-09-14 ENCOUNTER — Ambulatory Visit: Payer: 59 | Attending: Women's Health | Admitting: Physical Therapy

## 2021-09-14 ENCOUNTER — Other Ambulatory Visit: Payer: Self-pay

## 2021-09-14 DIAGNOSIS — M6281 Muscle weakness (generalized): Secondary | ICD-10-CM

## 2021-09-14 DIAGNOSIS — R279 Unspecified lack of coordination: Secondary | ICD-10-CM

## 2021-09-14 NOTE — Therapy (Signed)
[facility] @ [facility] [address] [place], [place], [address] Phone: [phone]   Fax:  [phone]  Physical Therapy Treatment  Patient Details  Name: Victoria [name] MRN: [id number] Date of Birth: 1996-01-26 Referring Provider (PT): Cheral Marker, [place]   Encounter Date: 09/14/2021   PT End of Session - 09/14/21 1226     Visit Number 20    Date for PT Re-Evaluation 11/21/21    Authorization Type AETNA    PT Start Time 1148    PT Stop Time 1227    PT Time Calculation (min) 39 min    Activity Tolerance Patient tolerated treatment well    Behavior During Therapy [facility] for tasks assessed/performed             Past Medical History:  Diagnosis Date   Abnormal Papanicolaou smear of cervix with positive human papilloma virus (HPV) test 09/08/2020   Pap was LSIL +HPV:  Needs colpo per ASCCP guidelines,  CIN 3+ immediate risk is 4.3%______________   Bipolar disorder (HCC)    Mitral valve prolapse    2D echo 09/2021 showed mild anterior mitral valve prolapse with mild MR   POTS (postural orthostatic tachycardia syndrome)     Past Surgical History:  Procedure Laterality Date   EXTRACORPOREAL CIRCULATION     LASER ABLATION CONDOLAMATA N/A 10/24/2020   Procedure: LASER ABLATION of the cervix;  Surgeon: Lazaro Arms, MD;  Location: [facility];  Service: Gynecology;  Laterality: N/A;    There were no vitals filed for this visit.   Subjective Assessment - 09/14/21 1151     Subjective Pt reports she has been trying to give more time between use of dilators at home to see if she has more tightness and notices at 3 days between use and has 3-4/10 pain with largest dilator in small set however does lower with use. Pt reports she was having 0/10 pain with everyday use.    How long can you sit comfortably? no limitations    How long can you stand comfortably? no limitations    How long can you walk comfortably? no limitations    Patient  Stated Goals to have less pain and tolerate vaginal intercourse and tolerate tampon vs menstrual disc/cup    Currently in Pain? No/denies             No emotional/communication barriers or cognitive limitation. Patient is motivated to learn. Patient understands and agrees with treatment goals and plan. PT explains patient will be examined in standing, sitting, and lying down to see how their muscles and joints work. When they are ready, they will be asked to remove their underwear so PT can examine their perineum. The patient is also given the option of providing their own chaperone as one is not provided in our facility. The patient also has the right and is explained the right to defer or refuse any part of the evaluation or treatment including the internal exam. With the patient's consent, PT will use one gloved finger to gently assess the muscles of the pelvic floor, seeing how well it contracts and relaxes and if there is muscle symmetry. After, the patient will get dressed and PT and patient will discuss exam findings and plan of care. PT and patient discuss plan of care, schedule, attendance policy and HEP activities.                Pelvic Floor Special Questions - 09/14/21 0001  Pelvic Floor Internal Exam patient identified and patient confirms consent for PT to perform inernal soft tissue work and muscle strength and integrity assessment    Exam Type Vaginal    Sensation WFL    Palpation no pain throughout treatment or with insertion of gloved digit.    Strength fair squeeze, definite lift    Strength # of reps 6   pt had less reps but with increased ability to hold contraction to 5s compared to 2   Strength # of seconds 5    Tone slightly increased tone at Lt side of pelvis and bil bulbocavernosus initially               [facility] Adult PT Treatment/Exercise - 09/14/21 0001       Neuro Re-ed    Neuro Re-ed Details  Pt directed in 3x10 pelvic contract/relaxations  with PT providing internal feedback and cues to complete with pt demonstrating greatly improved coodrination with first set. Pt was able to demonstrate increased reps at 3/5 strength in first set and ability to hold for 4-6s each time. Pt also able to complete quick flicks 2x10 with poor coorindation noted, extra time to complete and single step cues to contract/relax each tie      Manual Therapy   Manual Therapy Myofascial release;Internal Pelvic Floor    Manual therapy comments gentle stretching provided on Lt side of pelvis, and bil bulbocavernosus; to improve tissue relaxation for improved ability to complete NMRE training.                     PT Education - 09/14/21 1153     Education Details Pt educated on continuing HEP and dilator use at home.    Person(s) Educated Patient    Methods Explanation;Demonstration;Tactile cues;Verbal cues;Handout    Comprehension Verbalized understanding;Returned demonstration              PT Short Term Goals - 08/21/21 1020       PT SHORT TERM GOAL #1   Title Pt to be I with HEP    Time 6    Period Weeks    Status On-going    Target Date 06/05/21      PT SHORT TERM GOAL #2   Title pt to demonstrated improved pain with vaginal penetration to no more than 4/10 with size 2 dialator to equivalent sizing for progression to tolerate vaginal penetration.    Time 6    Period Weeks    Status Achieved    Target Date 06/05/21      PT SHORT TERM GOAL #3   Title pt to demonstrate I ability to self correct and complete diaphragmatic breathing technique to promote improved pelvic relaxation    Time 6    Period Weeks    Status Achieved    Target Date 06/05/21               PT Long Term Goals - 08/21/21 1020       PT LONG TERM GOAL #1   Title pt to be I with advanced HEP    Time 4    Period Months    Status On-going      PT LONG TERM GOAL #2   Title pt to demonstrate improved bil hip strength to at least 5/5 globally for  improved functional mobility and decreased compensatory strategies   4/5 in extension and abduction; 5/5 all else   Time 4    Period Months  Status On-going      PT LONG TERM GOAL #3   Title pt to report no more than 2/10 pain with size 4 vaginal dialator or size equivalent for progression to tolerate vaginal penetration   0/10   Time 4    Period Months    Status Achieved      PT LONG TERM GOAL #4   Title pt to report no more than 2/10 pain with size 6 vaginal dialator or size equivalent for progression to tolerate vaginal penetration and intercourse    Time 3    Period Months    Status New    Target Date 11/21/21      PT LONG TERM GOAL #5   Title pt to demonstrate ability to contract pelvic floor at 4/5 stretch consistently and isometric holds for 7s and full relaxation between reps to improve pelvic stability and proper mobility    Time 3    Period Months    Status New    Target Date 11/21/21                   Plan - 09/14/21 1228     Clinical Impression Statement Pt reports to clinic reporting she has been trying to increase time bewteen dilator use at home to see if tightness returns and this continues to return with initial pain 3-4/10 with dialtor use but deminishes as used. Pt reports she feels she has continued to improve consistently since starting PT, is limited in ability to purchse new set of larger dilators due to finances but also looking for other options online. Pt also educated on techniques for self stretching internally as able, continue pelvic relaxation techniques and HEP and dilator use. Pt verbalized understanding. Pt session focused on internal vaginal treatment with NMRE for improved coordination of pelvic floor mobility with pt continuing to demonstrate poor coordination of contract/relax/bulge/relax pattern, quick flicks and intermittently will demonstrate poor mechanics with bulging with attepmts to contrac, strength and endurance also treated with  pt demonstrating weakess and decreased endurance as well. Pt benefits from verbal cues to improve. Pt continues to demonstrate need for PT due to decreased strength, endurance and coordination of pelvic floor.    Personal Factors and Comorbidities Time since onset of injury/illness/exacerbation;Fitness    Examination-Activity Limitations Other    Examination-Participation Restrictions Interpersonal Relationship    Stability/Clinical Decision Making Evolving/Moderate complexity    Rehab Potential Good    PT Frequency 1x / week    PT Duration 12 weeks    PT Treatment/Interventions ADLs/Self Care Home Management;Functional mobility training;Therapeutic activities;Therapeutic exercise;Manual techniques;Passive range of motion;Dry needling;Taping;Patient/family education;Neuromuscular re-education;Energy conservation;Aquatic Therapy    PT Next Visit Plan hamstring,glute,adductor,trunk mobility and strength    PT Home Exercise Plan Access Code [email or web address]    Consulted and Agree with Plan of Care Patient             Patient will benefit from skilled therapeutic intervention in order to improve the following deficits and impairments:  Decreased coordination, Decreased range of motion, Decreased endurance, Decreased strength, Decreased mobility, Postural dysfunction, Improper body mechanics, Impaired flexibility, Pain  Visit Diagnosis: Muscle weakness (generalized)  Lack of coordination     Problem List Patient Active Problem List   Diagnosis Date Noted   Mitral valve prolapse 09/11/2021   Chest pain 06/22/2021   Anxiety with depression 06/22/2021   Chronic nonintractable headache 06/22/2021   Abnormal Pap smear of cervix 09/08/2020   Nexplanon in place 08/28/2020   Major  depressive disorder, recurrent, severe without psychotic features (HCC) 07/01/2018   Generalized anxiety disorder 07/01/2018   Moderate episode of recurrent major depressive disorder (HCC) 04/11/2018   Major depression,  recurrent (HCC) 01/07/2014   Autonomic dysreflexia 10/24/2012   Vasovagal near syncope 05/24/2012   Postural orthostatic tachycardia syndrome 08/27/2011    Otelia Sergeant, PT, DPT 09/15/2211:34 PM   [facility] @ [facility] [address] [place], [place], [address] Phone: [phone]   Fax:  [phone]  Name: MELANEY TELLEFSEN MRN: [id number] Date of Birth: Oct 04, 1996

## 2021-09-15 NOTE — Progress Notes (Signed)
reschedule

## 2021-09-15 NOTE — BH Specialist Note (Signed)
reschedule

## 2021-09-21 ENCOUNTER — Other Ambulatory Visit: Payer: Self-pay

## 2021-09-21 ENCOUNTER — Ambulatory Visit (INDEPENDENT_AMBULATORY_CARE_PROVIDER_SITE_OTHER): Payer: 59 | Admitting: Internal Medicine

## 2021-09-21 ENCOUNTER — Encounter: Payer: Self-pay | Admitting: Internal Medicine

## 2021-09-21 VITALS — BP 107/72 | HR 83 | Resp 16 | Ht 64.0 in | Wt 173.0 lb

## 2021-09-21 DIAGNOSIS — R69 Illness, unspecified: Secondary | ICD-10-CM | POA: Diagnosis not present

## 2021-09-21 DIAGNOSIS — G90A Postural orthostatic tachycardia syndrome (POTS): Secondary | ICD-10-CM

## 2021-09-21 DIAGNOSIS — R079 Chest pain, unspecified: Secondary | ICD-10-CM | POA: Diagnosis not present

## 2021-09-21 DIAGNOSIS — Z0000 Encounter for general adult medical examination without abnormal findings: Secondary | ICD-10-CM

## 2021-09-21 DIAGNOSIS — Z0001 Encounter for general adult medical examination with abnormal findings: Secondary | ICD-10-CM | POA: Diagnosis not present

## 2021-09-21 DIAGNOSIS — F418 Other specified anxiety disorders: Secondary | ICD-10-CM

## 2021-09-21 MED ORDER — HYDROXYZINE PAMOATE 25 MG PO CAPS: 25.0000 mg | ORAL_CAPSULE | Freq: Three times a day (TID) | ORAL | 2 refills | Status: DC | PRN

## 2021-09-21 NOTE — Assessment & Plan Note (Signed)
Takes Fludrocortisone 0.1 mg BID Advised to avoid sudden changes in posture CMP wnl

## 2021-09-21 NOTE — Assessment & Plan Note (Signed)
Did not tolerate Zoloft and other SSRIs in the past Was on Lamictal in the past, does not have any local Psychiatrist currently Will start Vistaril as needed for anxiety for now Follow-up with [facility] therapist Referred to psychiatry in [place] for possible bipolar disorder

## 2021-09-21 NOTE — Patient Instructions (Signed)
Please continue to take medications as prescribed.  Please get fasting blood tests done before the next visit. 

## 2021-09-21 NOTE — Assessment & Plan Note (Signed)
EKG: Sinus rhythm.  No signs of active ischemia. Unlikely to be cardiac etiology, likely related to anxiety - reassured for now Cardiology visit note reviewed, stress test and 2D echo WNL

## 2021-09-21 NOTE — Progress Notes (Signed)
Established Patient Office Visit  Subjective:  Patient ID: Victoria [name], female    DOB: May 25, 1996  Age: 25 y.o. MRN: [id number]  CC:  Chief Complaint  Patient presents with   Follow-up    POTS    HPI Victoria [name] is a 25 y.o. female with past medical history of POTS, anxiety and bipolar disorder who presents for f/u of her chronic medical conditions.  She had visit with cardiologist for episodes of chest pain, which is intermittent, bilateral, sharp, not related to activity and is nonradiating.  She had stress test and echo, which were unremarkable.  Of note, she has history of bipolar disorder according to chart review.  She used to take Lamictal and Zoloft for her depression and anxiety, but has not been able to see any local psychiatrist recently.  She was referred to [facility] therapist in the last visit, who has been helping with her anxiety.  She has not been set up with any psychiatrist yet.  She denies any SI or HI currently.  She has been taking Fludrocortisone for POTS. She denies any episodes of dizziness, LOC or syncope.  Past Medical History:  Diagnosis Date   Abnormal Papanicolaou smear of cervix with positive human papilloma virus (HPV) test 09/08/2020   Pap was LSIL +HPV:  Needs colpo per ASCCP guidelines,  CIN 3+ immediate risk is 4.3%______________   Bipolar disorder (HCC)    Mitral valve prolapse    2D echo 09/2021 showed mild anterior mitral valve prolapse with mild MR   POTS (postural orthostatic tachycardia syndrome)     Past Surgical History:  Procedure Laterality Date   EXTRACORPOREAL CIRCULATION     LASER ABLATION CONDOLAMATA N/A 10/24/2020   Procedure: LASER ABLATION of the cervix;  Surgeon: Lazaro Arms, MD;  Location: [facility];  Service: Gynecology;  Laterality: N/A;    Family History  Problem Relation Age of Onset   Diabetes Paternal Grandfather    Colon cancer Maternal Grandfather     Social History   Socioeconomic History   Marital status:  Legally Separated    Spouse name: Not on file   Number of children: Not on file   Years of education: Not on file   Highest education level: Not on file  Occupational History   Not on file  Tobacco Use   Smoking status: Never   Smokeless tobacco: Never  Vaping Use   Vaping Use: Former  Substance and Sexual Activity   Alcohol use: Yes    Comment: occ   Drug use: No   Sexual activity: Not Currently    Birth control/protection: Implant  Other Topics Concern   Not on file  Social History Narrative   Not on file   Social Determinants of Health   Financial Resource Strain: Not on file  Food Insecurity: Not on file  Transportation Needs: Not on file  Physical Activity: Not on file  Stress: Not on file  Social Connections: Not on file  Intimate Partner Violence: Not on file    Outpatient Medications Prior to Visit  Medication Sig Dispense Refill   etonogestrel (NEXPLANON) 68 MG IMPL implant 1 each by Subdermal route once.     fludrocortisone (FLORINEF) 0.1 MG tablet TAKE 1 TABLET BY MOUTH TWICE A DAY 60 tablet 1   ondansetron (ZOFRAN ODT) 8 MG disintegrating tablet Take 1 tablet (8 mg total) by mouth every 8 (eight) hours as needed for nausea or vomiting. 8 tablet 0   No facility-administered  medications prior to visit.    No Known Allergies  ROS Review of Systems  Constitutional:  Negative for chills and fever.  HENT:  Negative for congestion, sinus pressure, sinus pain and sore throat.   Eyes:  Negative for pain, discharge and redness.  Respiratory:  Negative for cough and shortness of breath.   Cardiovascular:  Positive for chest pain. Negative for palpitations and leg swelling.  Gastrointestinal:  Negative for abdominal pain, constipation, diarrhea, nausea and vomiting.  Endocrine: Negative for polydipsia and polyuria.  Genitourinary:  Negative for dysuria and hematuria.  Musculoskeletal:  Negative for back pain, neck pain and neck stiffness.  Skin:  Negative for  rash.  Neurological:  Positive for headaches. Negative for dizziness, weakness and numbness.  Hematological:  Does not bruise/bleed easily.  Psychiatric/Behavioral:  Negative for agitation and behavioral problems.      Objective:    Physical Exam Vitals reviewed.  Constitutional:      General: She is not in acute distress.    Appearance: She is not diaphoretic.  HENT:     Head: Normocephalic and atraumatic.     Nose: Nose normal. No congestion.     Mouth/Throat:     Mouth: Mucous membranes are moist.     Pharynx: No posterior oropharyngeal erythema.  Eyes:     General: No scleral icterus.    Extraocular Movements: Extraocular movements intact.  Cardiovascular:     Rate and Rhythm: Normal rate and regular rhythm.     Pulses: Normal pulses.     Heart sounds: Normal heart sounds. No murmur heard. Pulmonary:     Breath sounds: Normal breath sounds. No wheezing or rales.  Abdominal:     Palpations: Abdomen is soft.     Tenderness: There is no abdominal tenderness.  Musculoskeletal:     Cervical back: Neck supple. No tenderness.     Right lower leg: No edema.     Left lower leg: No edema.  Skin:    General: Skin is warm.     Findings: No rash.  Neurological:     General: No focal deficit present.     Mental Status: She is alert and oriented to person, place, and time.     Cranial Nerves: No cranial nerve deficit.     Sensory: No sensory deficit.     Motor: No weakness.  Psychiatric:        Mood and Affect: Mood normal.        Behavior: Behavior normal.    BP 107/72   Pulse 83   Resp 16   Ht 5\' 4"  (1.626 m)   Wt 173 lb 0.6 oz (78.5 kg)   SpO2 98%   BMI 29.70 kg/m  Wt Readings from Last 3 Encounters:  09/21/21 173 lb 0.6 oz (78.5 kg)  07/30/21 174 lb (78.9 kg)  06/22/21 171 lb (77.6 kg)    Lab Results  Component Value Date   TSH 0.949 08/28/2020   Lab Results  Component Value Date   WBC 5.3 10/23/2020   HGB 13.6 10/23/2020   HCT 40.7 10/23/2020   MCV  92.9 10/23/2020   PLT 340 10/23/2020   Lab Results  Component Value Date   NA 138 10/23/2020   K 3.5 10/23/2020   CO2 25 10/23/2020   GLUCOSE 99 10/23/2020   BUN 8 10/23/2020   CREATININE 0.82 10/23/2020   BILITOT 0.5 10/23/2020   ALKPHOS 58 10/23/2020   AST 13 (L) 10/23/2020   ALT 10 10/23/2020  PROT 7.0 10/23/2020   ALBUMIN 4.0 10/23/2020   CALCIUM 9.1 10/23/2020   ANIONGAP 8 10/23/2020   No results found for: CHOL No results found for: HDL No results found for: LDLCALC No results found for: TRIG No results found for: CHOLHDL No results found for: HGBA1C    Assessment & Plan:   Problem List Items Addressed This Visit       Cardiovascular and Mediastinum   Postural orthostatic tachycardia syndrome    Takes Fludrocortisone 0.1 mg BID Advised to avoid sudden changes in posture CMP wnl        Other   Chest pain    EKG: Sinus rhythm.  No signs of active ischemia. Unlikely to be cardiac etiology, likely related to anxiety - reassured for now Cardiology visit note reviewed, stress test and 2D echo WNL      Anxiety with depression - Primary    Did not tolerate Zoloft and other SSRIs in the past Was on Lamictal in the past, does not have any local Psychiatrist currently Will start Vistaril as needed for anxiety for now Follow-up with [facility] therapist Referred to psychiatry in [place] for possible bipolar disorder      Relevant Medications   hydrOXYzine (VISTARIL) 25 MG capsule   Other Relevant Orders   Ambulatory referral to Psychiatry     Meds ordered this encounter  Medications   hydrOXYzine (VISTARIL) 25 MG capsule    Sig: Take 1 capsule (25 mg total) by mouth every 8 (eight) hours as needed.    Dispense:  30 capsule    Refill:  2    Follow-up: Return in about 4 months (around 01/20/2022) for Annual physical.    Lindell Spar, MD

## 2021-09-23 ENCOUNTER — Ambulatory Visit: Payer: 59 | Admitting: Physical Therapy

## 2021-09-23 ENCOUNTER — Encounter: Payer: Self-pay | Admitting: Physical Therapy

## 2021-09-23 ENCOUNTER — Other Ambulatory Visit: Payer: Self-pay

## 2021-09-23 DIAGNOSIS — M6281 Muscle weakness (generalized): Secondary | ICD-10-CM

## 2021-09-23 DIAGNOSIS — R279 Unspecified lack of coordination: Secondary | ICD-10-CM

## 2021-09-23 NOTE — Therapy (Signed)
[facility] [address] [place], [place], [address] Phone: [phone]   Fax:  [phone]  Physical Therapy Treatment  Patient Details  Name: Victoria [name] MRN: [id number] Date of Birth: 15-Nov-1995 Referring Provider (PT): Cheral Marker, [place]   Encounter Date: 09/23/2021   PT End of Session - 09/23/21 0850     Visit Number 21    Date for PT Re-Evaluation 11/21/21    Authorization Type AETNA    PT Start Time 0850    PT Stop Time 0935    PT Time Calculation (min) 45 min    Activity Tolerance Patient tolerated treatment well    Behavior During Therapy [facility] for tasks assessed/performed             Past Medical History:  Diagnosis Date   Abnormal Papanicolaou smear of cervix with positive human papilloma virus (HPV) test 09/08/2020   Pap was LSIL +HPV:  Needs colpo per ASCCP guidelines,  CIN 3+ immediate risk is 4.3%______________   Bipolar disorder (HCC)    Mitral valve prolapse    2D echo 09/2021 showed mild anterior mitral valve prolapse with mild MR   POTS (postural orthostatic tachycardia syndrome)     Past Surgical History:  Procedure Laterality Date   EXTRACORPOREAL CIRCULATION     LASER ABLATION CONDOLAMATA N/A 10/24/2020   Procedure: LASER ABLATION of the cervix;  Surgeon: Lazaro Arms, MD;  Location: [facility];  Service: Gynecology;  Laterality: N/A;    There were no vitals filed for this visit.   Subjective Assessment - 09/23/21 1100     Subjective Denies pain today.    Currently in Pain? No/denies            Patient seen for aquatic therapy today.  Treatment took place in water 2.5-4 feet deep depending upon activity.  Pt entered the pool via stairs with mild use of the rails. Water temp 92 degrees F. Pt requires buoyancy of water for support and to offload joints with strengthening exercises.  Pt utilizes viscosity of the water required for strengthening.   75% standing depth; Water  walking with longer strides 10x forward/back with blue noodle push pull. Hand paddles used for side stepping/side squats 10x. Tandem stance with yellow double flotation horizontal add/abd 2x1- BIl. Hip abd/add with increased velocity 20x Bil, hip circumduction 10x in each direction. 4 min underwater bicycle  4 bouts for endurance using yellow noodle behind pt. Seated decompression with large noodle behind patient. Intermittent LE AROM  but mainly for relaxation.                               PT Short Term Goals - 08/21/21 1020       PT SHORT TERM GOAL #1   Title Pt to be I with HEP    Time 6    Period Weeks    Status On-going    Target Date 06/05/21      PT SHORT TERM GOAL #2   Title pt to demonstrated improved pain with vaginal penetration to no more than 4/10 with size 2 dialator to equivalent sizing for progression to tolerate vaginal penetration.    Time 6    Period Weeks    Status Achieved    Target Date 06/05/21      PT SHORT TERM GOAL #3   Title pt to demonstrate I ability to self correct and  complete diaphragmatic breathing technique to promote improved pelvic relaxation    Time 6    Period Weeks    Status Achieved    Target Date 06/05/21               PT Long Term Goals - 08/21/21 1020       PT LONG TERM GOAL #1   Title pt to be I with advanced HEP    Time 4    Period Months    Status On-going      PT LONG TERM GOAL #2   Title pt to demonstrate improved bil hip strength to at least 5/5 globally for improved functional mobility and decreased compensatory strategies   4/5 in extension and abduction; 5/5 all else   Time 4    Period Months    Status On-going      PT LONG TERM GOAL #3   Title pt to report no more than 2/10 pain with size 4 vaginal dialator or size equivalent for progression to tolerate vaginal penetration   0/10   Time 4    Period Months    Status Achieved      PT LONG TERM GOAL #4   Title pt to report no more than  2/10 pain with size 6 vaginal dialator or size equivalent for progression to tolerate vaginal penetration and intercourse    Time 3    Period Months    Status New    Target Date 11/21/21      PT LONG TERM GOAL #5   Title pt to demonstrate ability to contract pelvic floor at 4/5 stretch consistently and isometric holds for 7s and full relaxation between reps to improve pelvic stability and proper mobility    Time 3    Period Months    Status New    Target Date 11/21/21                   Plan - 09/23/21 1101     Clinical Impression Statement Pt arrives to aquatic PTwith no complaints of pain. Pt was able to utilize hand paddles and increased limb velocity to increase the resisatnce on all her exercises today. No fatigue with global muscle exercises, tolerating all aquatic advances well.    Personal Factors and Comorbidities Time since onset of injury/illness/exacerbation;Fitness    Examination-Participation Restrictions Interpersonal Relationship    Stability/Clinical Decision Making Evolving/Moderate complexity    Rehab Potential Good    PT Frequency 1x / week    PT Duration 12 weeks    PT Treatment/Interventions ADLs/Self Care Home Management;Functional mobility training;Therapeutic activities;Therapeutic exercise;Manual techniques;Passive range of motion;Dry needling;Taping;Patient/family education;Neuromuscular re-education;Energy conservation;Aquatic Therapy    PT Next Visit Plan hamstring,glute,adductor,trunk mobility and strength    PT Home Exercise Plan Access Code [email or web address]    Consulted and Agree with Plan of Care Patient             Patient will benefit from skilled therapeutic intervention in order to improve the following deficits and impairments:  Decreased coordination, Decreased range of motion, Decreased endurance, Decreased strength, Decreased mobility, Postural dysfunction, Improper body mechanics, Impaired flexibility, Pain  Visit Diagnosis: Muscle  weakness (generalized)  Lack of coordination     Problem List Patient Active Problem List   Diagnosis Date Noted   Mitral valve prolapse 09/11/2021   Chest pain 06/22/2021   Anxiety with depression 06/22/2021   Chronic nonintractable headache 06/22/2021   Abnormal Pap smear of cervix 09/08/2020   Nexplanon in place  08/28/2020   Major depressive disorder, recurrent, severe without psychotic features (HCC) 07/01/2018   Generalized anxiety disorder 07/01/2018   Moderate episode of recurrent major depressive disorder (HCC) 04/11/2018   Major depression, recurrent (HCC) 01/07/2014   Autonomic dysreflexia 10/24/2012   Vasovagal near syncope 05/24/2012   Postural orthostatic tachycardia syndrome 08/27/2011    Bishop Vanderwerf, PTA 09/23/2021, 11:04 AM  [facility] [address] [place], [place], [address] Phone: [phone]   Fax:  [phone]  Name: MEHA VIDRINE MRN: [id number] Date of Birth: [birth year]

## 2021-09-24 ENCOUNTER — Ambulatory Visit (INDEPENDENT_AMBULATORY_CARE_PROVIDER_SITE_OTHER): Payer: 59 | Admitting: Psychology

## 2021-09-24 ENCOUNTER — Ambulatory Visit (INDEPENDENT_AMBULATORY_CARE_PROVIDER_SITE_OTHER): Payer: 59 | Admitting: Licensed Clinical Social Worker

## 2021-09-24 DIAGNOSIS — F332 Major depressive disorder, recurrent severe without psychotic features: Secondary | ICD-10-CM

## 2021-09-24 DIAGNOSIS — F319 Bipolar disorder, unspecified: Secondary | ICD-10-CM | POA: Diagnosis not present

## 2021-09-24 DIAGNOSIS — R69 Illness, unspecified: Secondary | ICD-10-CM | POA: Diagnosis not present

## 2021-09-24 NOTE — Progress Notes (Signed)
[facility] Counselor/Therapist Progress Note  Patient ID: Victoria [name], MRN: [id number],    Date: 09/24/2021  Time Spent: 11:00am-11:39am   Treatment Type: Individual Therapy  pt is seen for a virtual video visit via webex.  Pt joins from her home and counselor from her home office.   Reported Symptoms: headaches,  stress,  mild anxiety, mild depressed mood.  Mental Status Exam: Appearance:  Well Groomed     Behavior: Appropriate  Motor: Normal  Speech/Language:  Normal Rate  Affect: Blunt  Mood: depressed  Thought process: normal  Thought content:   WNL  Sensory/Perceptual disturbances:   Headache   Orientation: oriented to person, place, time/date, and situation  Attention: Good  Concentration: Fair  Memory: WNL  Fund of knowledge:  Good  Insight:   Good  Judgment:  Good  Impulse Control: Good   Risk Assessment: Danger to Self:  No Self-injurious Behavior: No Danger to Others: No Duty to Warn:no Physical Aggression / Violence:No  Access to Firearms a concern: No  Gang Involvement:No   Subjective: Counselor assessed pt current functioning per pt report.  Processed w/ pt stress of holiday, planning, decisions and physically not feeling well.   Discussed her relaxation and self care for coping w/ headaches and stress.  Discussed decision on whether to visit mom's for holiday.  Explored w/ pt what's in her control and what she feels comfortable with. Pt reports she is not feeling physically well today and currently has a headache. Pt reports this has almost been daily- w/ nausea and fatigue.  Pt feels discouraged by her recent visits w/ doctors and that concerns seem minimized. Pt expressed stress re: decision to go to mom's for holidays as mom wants her to go, but doesn't enjoy as hectic w/ everyone staying in small space and hx of negative interactions.  Pt identified what is in her control, options for removing self if needed and boundaries w/ having her  own designated space if staying.   Interventions: Cognitive Behavioral Therapy and relaxation strategies.   Diagnosis:Bipolar I disorder (HCC)  Plan: f/u w/ pt in 2 weeks for counseling to assist coping w/ stress, anxiety and mood stability.  See tx plan on file in Therapy Charts. Pt to f/u w/ seeking referral to psychiatrist.  Counselor encouraged referral to neurologist as well.   Forde Radon, [facility]

## 2021-09-30 ENCOUNTER — Other Ambulatory Visit: Payer: Self-pay

## 2021-09-30 ENCOUNTER — Ambulatory Visit: Payer: Self-pay | Admitting: Physical Therapy

## 2021-09-30 ENCOUNTER — Ambulatory Visit: Payer: 59 | Admitting: Physical Therapy

## 2021-09-30 DIAGNOSIS — R279 Unspecified lack of coordination: Secondary | ICD-10-CM

## 2021-09-30 DIAGNOSIS — M6281 Muscle weakness (generalized): Secondary | ICD-10-CM

## 2021-09-30 NOTE — Therapy (Signed)
[facility] [address] [place], [place], [address] Phone: [phone]   Fax:  [phone]  Physical Therapy Treatment  Patient Details  Name: Victoria [name] MRN: [id number] Date of Birth: 16-Jun-1996 Referring Provider (PT): Cheral Marker, [place]   Encounter Date: 09/30/2021   PT End of Session - 09/30/21 0927     Visit Number 22    Date for PT Re-Evaluation 11/21/21    Authorization Type AETNA    PT Start Time 0849    PT Stop Time 0930    PT Time Calculation (min) 41 min    Activity Tolerance Patient tolerated treatment well    Behavior During Therapy [facility] for tasks assessed/performed             Past Medical History:  Diagnosis Date   Abnormal Papanicolaou smear of cervix with positive human papilloma virus (HPV) test 09/08/2020   Pap was LSIL +HPV:  Needs colpo per ASCCP guidelines,  CIN 3+ immediate risk is 4.3%______________   Bipolar disorder (HCC)    Mitral valve prolapse    2D echo 09/2021 showed mild anterior mitral valve prolapse with mild MR   POTS (postural orthostatic tachycardia syndrome)     Past Surgical History:  Procedure Laterality Date   EXTRACORPOREAL CIRCULATION     LASER ABLATION CONDOLAMATA N/A 10/24/2020   Procedure: LASER ABLATION of the cervix;  Surgeon: Lazaro Arms, MD;  Location: [facility];  Service: Gynecology;  Laterality: N/A;    There were no vitals filed for this visit.   Subjective Assessment - 09/30/21 0850     Subjective Pt reports pain hasn't been too bad; has attempted tampons without pain. Still good with dilators as well, use every other day for the majority of the time. Pt report she wants to focus PT geared toward improved coordination of pelvic floor now that she is able to relax more.    How long can you sit comfortably? no limitations    How long can you stand comfortably? no limitations    How long can you walk comfortably? no limitations    Patient Stated  Goals to have less pain and tolerate vaginal intercourse and to improve coordination of pelvic floor    Currently in Pain? No/denies                               [facility] Adult PT Treatment/Exercise - 09/30/21 0001       Exercises   Exercises Lumbar;Knee/Hip      Lumbar Exercises: Stretches   Other Lumbar Stretch Exercise foam rolling on bil glutes and hip abductors x10 each    Other Lumbar Stretch Exercise cobra 2x45s; hip flexors in half kneeling; mid back openers 2x45s; elevated lunge hip IR 2x30s      Lumbar Exercises: Quadruped   Madcat/Old Horse 10 reps      Manual Therapy   Manual Therapy Soft tissue mobilization    Manual therapy comments manual work at Lt anterior pelvis and abdomen with fasical retrictions noted mildly and improved with release and soft tissue mobility. Addaday used at bil glutes, hamstrings, adductors and abductors and quads for improved tissue mobility and tolerance to upright mobility without cramping or pain at pelvis.                     PT Education - 09/30/21 [phone]     Education Details  Pt educated on dilator progressions and alternatives; increasing HEP for strengthening as well    Person(s) Educated Patient    Methods Explanation;Demonstration;Tactile cues;Verbal cues    Comprehension Verbalized understanding;Returned demonstration              PT Short Term Goals - 08/21/21 1020       PT SHORT TERM GOAL #1   Title Pt to be I with HEP    Time 6    Period Weeks    Status On-going    Target Date 06/05/21      PT SHORT TERM GOAL #2   Title pt to demonstrated improved pain with vaginal penetration to no more than 4/10 with size 2 dialator to equivalent sizing for progression to tolerate vaginal penetration.    Time 6    Period Weeks    Status Achieved    Target Date 06/05/21      PT SHORT TERM GOAL #3   Title pt to demonstrate I ability to self correct and complete diaphragmatic breathing technique to  promote improved pelvic relaxation    Time 6    Period Weeks    Status Achieved    Target Date 06/05/21               PT Long Term Goals - 08/21/21 1020       PT LONG TERM GOAL #1   Title pt to be I with advanced HEP    Time 4    Period Months    Status On-going      PT LONG TERM GOAL #2   Title pt to demonstrate improved bil hip strength to at least 5/5 globally for improved functional mobility and decreased compensatory strategies   4/5 in extension and abduction; 5/5 all else   Time 4    Period Months    Status On-going      PT LONG TERM GOAL #3   Title pt to report no more than 2/10 pain with size 4 vaginal dialator or size equivalent for progression to tolerate vaginal penetration   0/10   Time 4    Period Months    Status Achieved      PT LONG TERM GOAL #4   Title pt to report no more than 2/10 pain with size 6 vaginal dialator or size equivalent for progression to tolerate vaginal penetration and intercourse    Time 3    Period Months    Status New    Target Date 11/21/21      PT LONG TERM GOAL #5   Title pt to demonstrate ability to contract pelvic floor at 4/5 stretch consistently and isometric holds for 7s and full relaxation between reps to improve pelvic stability and proper mobility    Time 3    Period Months    Status New    Target Date 11/21/21                   Plan - 09/30/21 0370     Clinical Impression Statement Pt presents to clinic reporting she was very pleased that she was able to use tampons and attempt menstrual discs at last period. The discs were a little uncomfortable but tolerated tampons well. Pt reports her goal is have more coordination at pelvic floor without pain moving forward. Pt session focused on manual work at bil hips and glutes and stretching at spine, core, hips. Pt tolerated well and demonstrated improved mobility with less pain than prior  sessions and reports she felt very tight at start of session but feels  better now. Pt educated on progressions with dilators and strengthening at home. Pt continues to demonstrate need of PT for improved pelvic floor relaxation, coordination and strengthening for decreased pain and improved QOL.    Personal Factors and Comorbidities Time since onset of injury/illness/exacerbation;Fitness    Examination-Participation Restrictions Interpersonal Relationship    Stability/Clinical Decision Making Evolving/Moderate complexity    Rehab Potential Good    PT Frequency 1x / week    PT Duration 12 weeks    PT Treatment/Interventions ADLs/Self Care Home Management;Functional mobility training;Therapeutic activities;Therapeutic exercise;Manual techniques;Passive range of motion;Dry needling;Taping;Patient/family education;Neuromuscular re-education;Energy conservation;Aquatic Therapy    PT Next Visit Plan hamstring,glute,adductor,trunk mobility and strength    PT Home Exercise Plan Access Code [email or web address]    Consulted and Agree with Plan of Care Patient             Patient will benefit from skilled therapeutic intervention in order to improve the following deficits and impairments:  Decreased coordination, Decreased range of motion, Decreased endurance, Decreased strength, Decreased mobility, Postural dysfunction, Improper body mechanics, Impaired flexibility, Pain  Visit Diagnosis: Lack of coordination  Muscle weakness (generalized)     Problem List Patient Active Problem List   Diagnosis Date Noted   Mitral valve prolapse 09/11/2021   Chest pain 06/22/2021   Anxiety with depression 06/22/2021   Chronic nonintractable headache 06/22/2021   Abnormal Pap smear of cervix 09/08/2020   Nexplanon in place 08/28/2020   Major depressive disorder, recurrent, severe without psychotic features (HCC) 07/01/2018   Generalized anxiety disorder 07/01/2018   Moderate episode of recurrent major depressive disorder (HCC) 04/11/2018   Major depression, recurrent (HCC) 01/07/2014    Autonomic dysreflexia 10/24/2012   Vasovagal near syncope 05/24/2012   Postural orthostatic tachycardia syndrome 08/27/2011   Otelia Sergeant, PT, DPT 12/21/229:35 AM   [facility] [address] [place], [place], [address] Phone: [phone]   Fax:  [phone]  Name: ACACIA LATORRE MRN: [id number] Date of Birth: 03/30/1996

## 2021-10-13 ENCOUNTER — Ambulatory Visit
Admission: EM | Admit: 2021-10-13 | Discharge: 2021-10-13 | Disposition: A | Discharge status: Home / Self Care | Payer: 59 | Attending: Emergency Medicine | Admitting: Emergency Medicine

## 2021-10-13 ENCOUNTER — Other Ambulatory Visit: Payer: Self-pay

## 2021-10-13 DIAGNOSIS — B349 Viral infection, unspecified: Secondary | ICD-10-CM

## 2021-10-13 DIAGNOSIS — R051 Acute cough: Secondary | ICD-10-CM

## 2021-10-13 DIAGNOSIS — J029 Acute pharyngitis, unspecified: Secondary | ICD-10-CM

## 2021-10-13 MED ORDER — PREDNISONE 10 MG (21) PO TBPK: ORAL_TABLET | Freq: Every day | ORAL | 0 refills | Status: DC

## 2021-10-13 MED ORDER — FLUTICASONE PROPIONATE 50 MCG/ACT NA SUSP: 1.0000 | Freq: Every day | NASAL | 2 refills | Status: DC

## 2021-10-13 NOTE — ED Triage Notes (Signed)
Pt presents to the office for cough,sore throat and fatigue x 2 days.

## 2021-10-13 NOTE — ED Provider Notes (Signed)
[facility]    CSN: [id number] Arrival date & time: 10/13/21  1020      History   Chief Complaint Chief Complaint  Patient presents with   Cough    Sore throat     HPI Victoria [name] is a 26 y.o. female.   Patient is here for congestion slight sore throat and a cough intermittently for 2 days now.  Patient denies any fevers no nausea vomiting diarrhea no body aches.  Has not taken anything prior to arrival.   Past Medical History:  Diagnosis Date   Abnormal Papanicolaou smear of cervix with positive human papilloma virus (HPV) test 09/08/2020   Pap was LSIL +HPV:  Needs colpo per ASCCP guidelines,  CIN 3+ immediate risk is 4.3%______________   Bipolar disorder (HCC)    Mitral valve prolapse    2D echo 09/2021 showed mild anterior mitral valve prolapse with mild MR   POTS (postural orthostatic tachycardia syndrome)     Patient Active Problem List   Diagnosis Date Noted   Mitral valve prolapse 09/11/2021   Chest pain 06/22/2021   Anxiety with depression 06/22/2021   Chronic nonintractable headache 06/22/2021   Abnormal Pap smear of cervix 09/08/2020   Nexplanon in place 08/28/2020   Major depressive disorder, recurrent, severe without psychotic features (HCC) 07/01/2018   Generalized anxiety disorder 07/01/2018   Moderate episode of recurrent major depressive disorder (HCC) 04/11/2018   Major depression, recurrent (HCC) 01/07/2014   Autonomic dysreflexia 10/24/2012   Vasovagal near syncope 05/24/2012   Postural orthostatic tachycardia syndrome 08/27/2011    Past Surgical History:  Procedure Laterality Date   EXTRACORPOREAL CIRCULATION     LASER ABLATION CONDOLAMATA N/A 10/24/2020   Procedure: LASER ABLATION of the cervix;  Surgeon: Lazaro Arms, MD;  Location: [facility];  Service: Gynecology;  Laterality: N/A;    OB History     Gravida  0   Para  0   Term  0   Preterm  0   AB  0   Living  0      SAB  0   IAB  0   Ectopic  0    Multiple  0   Live Births  0            Home Medications    Prior to Admission medications   Medication Sig Start Date End Date Taking? Authorizing Provider  fluticasone (FLONASE) 50 MCG/ACT nasal spray Place 1 spray into both nostrils daily. 10/13/21  Yes Coralyn Mark, NP  predniSONE (STERAPRED UNI-PAK 21 TAB) 10 MG (21) TBPK tablet Take by mouth daily. Take 6 tabs by mouth daily  for 2 days, then 5 tabs for 2 days, then 4 tabs for 2 days, then 3 tabs for 2 days, 2 tabs for 2 days, then 1 tab by mouth daily for 2 days 10/13/21  Yes Coralyn Mark, NP  etonogestrel (NEXPLANON) 68 MG IMPL implant 1 each by Subdermal route once.    [provider]  fludrocortisone (FLORINEF) 0.1 MG tablet TAKE 1 TABLET BY MOUTH TWICE A DAY 09/02/21   Anabel Halon, MD  hydrOXYzine (VISTARIL) 25 MG capsule Take 1 capsule (25 mg total) by mouth every 8 (eight) hours as needed. 09/21/21   Anabel Halon, MD  ondansetron (ZOFRAN ODT) 8 MG disintegrating tablet Take 1 tablet (8 mg total) by mouth every 8 (eight) hours as needed for nausea or vomiting. 10/24/20   Lazaro Arms, MD  Family History Family History  Problem Relation Age of Onset   Diabetes Paternal Grandfather    Colon cancer Maternal Grandfather     Social History Social History   Tobacco Use   Smoking status: Never   Smokeless tobacco: Never  Vaping Use   Vaping Use: Former  Substance Use Topics   Alcohol use: Yes    Comment: occ   Drug use: No     Allergies   Patient has no known allergies.   Review of Systems Review of Systems  Constitutional:  Negative for chills, fatigue and fever.  HENT:  Positive for congestion, postnasal drip, rhinorrhea, sneezing and sore throat. Negative for sinus pressure and sinus pain.   Eyes: Negative.   Respiratory:  Positive for cough. Negative for shortness of breath.   Cardiovascular: Negative.   Gastrointestinal: Negative.   Genitourinary: Negative.    Neurological: Negative.     Physical Exam Triage Vital Signs ED Triage Vitals  Enc Vitals Group     BP 10/13/21 1027 118/62     Pulse Rate 10/13/21 1027 87     Resp 10/13/21 1027 18     Temp 10/13/21 1027 98 F (36.7 C)     Temp Source 10/13/21 1027 Oral     SpO2 10/13/21 1027 100 %     Weight --      Height --      Head Circumference --      Peak Flow --      Pain Score 10/13/21 1028 0     Pain Loc --      Pain Edu? --      Excl. in GC? --    No data found.  Updated Vital Signs BP 118/62 (BP Location: Left Arm)    Pulse 87    Temp 98 F (36.7 C) (Oral)    Resp 18    LMP 09/29/2021    SpO2 100%   Visual Acuity Right Eye Distance:   Left Eye Distance:   Bilateral Distance:    Right Eye Near:   Left Eye Near:    Bilateral Near:     Physical Exam Constitutional:      Appearance: Normal appearance.  HENT:     Right Ear: Tympanic membrane normal.     Left Ear: Tympanic membrane normal.     Nose: Congestion and rhinorrhea present.     Mouth/Throat:     Mouth: Mucous membranes are moist.     Pharynx: Posterior oropharyngeal erythema present.  Eyes:     Pupils: Pupils are equal, round, and reactive to light.  Cardiovascular:     Rate and Rhythm: Normal rate.  Pulmonary:     Effort: Pulmonary effort is normal.  Abdominal:     General: Abdomen is flat.  Skin:    General: Skin is warm.  Neurological:     General: No focal deficit present.     Mental Status: She is alert.     UC Treatments / Results  Labs (all labs ordered are listed, but only abnormal results are displayed) Labs Reviewed - No data to display  EKG   Radiology No results found.  Procedures Procedures (including critical care time)  Medications Ordered in UC Medications - No data to display  Initial Impression / Assessment and Plan / UC Course  I have reviewed the triage vital signs and the nursing notes.  Pertinent labs & imaging results that were available during my care of  the patient were reviewed  by me and considered in my medical decision making (see chart for details).     Take over-the-counter cough and cold medicine as needed Can take Tylenol or Motrin as needed for pain Use nasal spray as needed Your symptoms are more viral in nature no antibiotic is needed at this time Coughs can linger for several weeks after an illness you can use a humidifier to help reduce the cough Final Clinical Impressions(s) / UC Diagnoses   Final diagnoses:  Acute cough  Viral illness  Acute pharyngitis, unspecified etiology   Discharge Instructions   None    ED Prescriptions     Medication Sig Dispense Auth. Provider   fluticasone (FLONASE) 50 MCG/ACT nasal spray Place 1 spray into both nostrils daily. 16 g Maple MirzaMitchell, Moosa Bueche L, NP   predniSONE (STERAPRED UNI-PAK 21 TAB) 10 MG (21) TBPK tablet Take by mouth daily. Take 6 tabs by mouth daily  for 2 days, then 5 tabs for 2 days, then 4 tabs for 2 days, then 3 tabs for 2 days, 2 tabs for 2 days, then 1 tab by mouth daily for 2 days 42 tablet Coralyn MarkMitchell, Kyasia Steuck L, NP      PDMP not reviewed this encounter.   Coralyn MarkMitchell, Shawntay Prest L, NP 10/13/21 1053

## 2021-10-15 ENCOUNTER — Ambulatory Visit (INDEPENDENT_AMBULATORY_CARE_PROVIDER_SITE_OTHER): Payer: 59 | Admitting: Psychology

## 2021-10-15 DIAGNOSIS — F319 Bipolar disorder, unspecified: Secondary | ICD-10-CM | POA: Diagnosis not present

## 2021-10-15 DIAGNOSIS — R69 Illness, unspecified: Secondary | ICD-10-CM | POA: Diagnosis not present

## 2021-10-15 NOTE — Progress Notes (Signed)
[place] Counselor/Therapist Progress Note  Patient ID: Victoria [name], MRN: [id number],    Date: 10/15/2021  Time Spent: 11:00am-11:54am   Treatment Type: Individual Therapy  pt is seen for a virtual video visit via webex.  Pt joins from her home and counselor from her home office.   Reported Symptoms: stress,  mild anxiety, negative self talk Mental Status Exam: Appearance:  Well Groomed     Behavior: Appropriate  Motor: Normal  Speech/Language:  Normal Rate  Affect: Appropriate  Mood: depressed  Thought process: normal  Thought content:   WNL  Sensory/Perceptual disturbances:   WNL  Orientation: oriented to person, place, time/date, and situation  Attention: Good  Concentration: Good  Memory: WNL  Fund of knowledge:  Good  Insight:   Good  Judgment:  Good  Impulse Control: Good   Risk Assessment: Danger to Self:  No Self-injurious Behavior: No Danger to Others: No Duty to Warn:no Physical Aggression / Violence:No  Access to Firearms a concern: No  Gang Involvement:No   Subjective: Counselor assessed pt current functioning per pt report.  Processed w/ pt interactions over holiday and financial stressors upcoming.  Discussed positive self care and steps taken towards advocacy for health.  Explored w/pt negative self talk around "adulting" and assisted pt in reflecting on things she has been doing this year that reflects competence.  Assisted pt w/ reframing negative self talk.  Pt affect wnl.  Pt reported that she enjoyed holidays and went ok.  Pt reported that her stepbrother who grew up in her household may become a roommate this month and challenges and positives this could have.  Pt discussed her focus currently is for looking for work in next month.  Pt discussed typically negative self talk and discouraging about.  Pt expressed that worries that not able to "adult" and take care of self.  Pt recognized as distortion and challenged w/ ways has this year.  Pt able to reframe w/ counselor assistance.   Interventions: Cognitive Behavioral Therapy and positive self talk.   Diagnosis:Bipolar I disorder ([place])  Plan: f/u w/ pt in 2 weeks for counseling to assist coping w/ stress, anxiety and mood stability.  See tx plan on file in Therapy Charts. Pt to f/u w/ seeking referral to psychiatrist.  Counselor encouraged referral to neurologist as well.   Treatment Plan 01/08/21 Client Abilities/Strengths  supports: my maternal uncle and his friend [name] and my best friend- who I met in college. pt enjoys her dogs, reading and singing  Client Treatment Preferences  biweekly counseling to start. pt is being referred to psychiatrist by her PCP to manage meds.  Client Statement of Needs  pt: "i feel like i should process some things and deal w/ some things to help me take care of myself and know what I want" be able to cope and deal w/ the things that have hit me hard recent in a positive way."  Treatment Level  outpatient counseling  Symptoms  Autonomic hyperactivity (e.g., palpitations, shortness of breath, dry mouth, trouble swallowing, nausea, diarrhea).: No Description Entered (Status: maintained). Decrease or loss of appetite.: No Description Entered (Status: maintained). Depressed or irritable mood.: No Description Entered (Status: maintained). History of at least one hypomanic, manic, or mixed mood episode.: No Description Entered (Status: maintained). History of chronic or recurrent depression for which the client has taken antidepressant medication, been hospitalized, had outpatient treatment, or had a course of electroconvulsive therapy.: No Description Entered (Status: maintained). Hypervigilance (e.g., feeling  constantly on edge, experiencing concentration difficulties, having trouble falling or staying asleep, exhibiting a general state of irritability).: No Description Entered (Status: maintained). Lack of energy.: No Description Entered (Status:  maintained). Motor tension (e.g., restlessness, tiredness, shakiness, muscle tension).: No Description Entered (Status: maintained). Sleeplessness or hypersomnia.: No Description Entered (Status: maintained).  Problems Addressed  Bipolar Disorder - Depression, Bipolar Disorder - Depression, Anxiety  Goals 1. Alleviate depressive symptoms and return to previous level of effective functioning. 2. Develop healthy cognitive patterns and beliefs about self and the world that lead to alleviation and help prevent the relapse of mood episodes. Objective Discuss and resolve troubling personal and interpersonal issues. Target Date: 2022-01-08 Frequency: Daily  Progress: 0 Modality: individual  Related Interventions Use interpersonal therapy techniques to explore and resolve issues surrounding grief, role disputes, role transitions, and social skills deficits; provide support and strategies for resolving identified interpersonal issues. Objective Identify and replace thoughts and behaviors that trigger manic or depressive symptoms. Target Date: 2022-01-08 Frequency: Daily  Progress: 0 Modality: individual  Related Interventions Use cognitive therapy techniques to explore and educate the client about cognitive biases that trigger his/her elevated or depressive mood (see Cognitive Therapy for Bipolar Disorder by Lanny Hurst al.). 3. Enhance ability to effectively cope with the full variety of life's worries and anxieties. Objective Learn and implement problem-solving strategies for realistically addressing worries. Target Date: 2022-01-08 Frequency: Daily  Progress: 0 Modality: individual  Related Interventions Teach the client problem-solving strategies involving specifically defining a problem, generating options for addressing it, evaluating the pros and cons of each option, selecting and implementing an optional action, and reevaluating and refining the action (or assign "Applying Problem-Solving to  Interpersonal Conflict" in the Adult Psychotherapy Homework Planner by Bryn Gulling). Objective Identify, challenge, and replace biased, fearful self-talk with positive, realistic, and empowering self-talk. Target Date: 2022-01-08 Frequency: Daily  Progress: 0 Modality: individual  Related Interventions Explore the client's schema and self-talk that mediate his/her fear response; assist him/her in challenging the biases; replace the distorted messages with reality-based alternatives and positive, realistic self-talk that will increase his/her self-confidence in coping with irrational fears (see Cognitive Therapy of Anxiety Disorders by Alison Stalling). Objective Learn and implement calming skills to reduce overall anxiety and manage anxiety symptoms. Target Date: 2022-01-08 Frequency: Daily  Progress: 0 Modality: individual  Related Interventions Teach the client calming/relaxation skills (e.g., applied relaxation, progressive muscle relaxation, cue controlled relaxation; mindful breathing; biofeedback) and how to discriminate better between relaxation and tension; teach the client how to apply these skills to his/her daily life (e.g., New Directions in Progressive Muscle Relaxation by Casper Harrison, and Hazlett-Stevens; Treating Generalized Anxiety Disorder by Rygh and Amparo Bristol). Pt participated in development of plan and provided verbal consent.   Jan Fireman, [facility]

## 2021-10-16 ENCOUNTER — Ambulatory Visit: Payer: Self-pay | Admitting: Physical Therapy

## 2021-10-21 ENCOUNTER — Ambulatory Visit: Payer: 59 | Admitting: Psychology

## 2021-10-21 ENCOUNTER — Encounter: Payer: Self-pay | Admitting: Physical Therapy

## 2021-10-22 ENCOUNTER — Ambulatory Visit (INDEPENDENT_AMBULATORY_CARE_PROVIDER_SITE_OTHER): Payer: 59 | Admitting: Licensed Clinical Social Worker

## 2021-10-22 ENCOUNTER — Other Ambulatory Visit: Payer: Self-pay

## 2021-10-22 DIAGNOSIS — F332 Major depressive disorder, recurrent severe without psychotic features: Secondary | ICD-10-CM

## 2021-10-23 ENCOUNTER — Ambulatory Visit: Payer: Self-pay | Admitting: Physical Therapy

## 2021-10-23 NOTE — BH Specialist Note (Signed)
[place] Follow Up Assessment  MRN: [id number] NAME: Victoria [name] Date: 09/24/21  Start time: 1230 End time: 1240 Total time:  10 min  Type of Contact: Follow up Call  Current concerns/stressors: anxiousness    Functional Assessment:  Sleep: fair Appetite: fair Coping ability: overwhelmed Patient taking medications as prescribed:  yes  Current medications:  Outpatient Encounter Medications as of 09/24/2021  Medication Sig   etonogestrel (NEXPLANON) 68 MG IMPL implant 1 each by Subdermal route once.   fludrocortisone (FLORINEF) 0.1 MG tablet TAKE 1 TABLET BY MOUTH TWICE A DAY   hydrOXYzine (VISTARIL) 25 MG capsule Take 1 capsule (25 mg total) by mouth every 8 (eight) hours as needed.   ondansetron (ZOFRAN ODT) 8 MG disintegrating tablet Take 1 tablet (8 mg total) by mouth every 8 (eight) hours as needed for nausea or vomiting.   No facility-administered encounter medications on file as of 09/24/2021.    Self-harm and/or Suicidal Behaviors Risk Assessment Self-harm risk factors: no Patient endorses recent self injurious thoughts and/or behaviors: No   Suicide ideations: No plan to harm self or others   Danger to Others Risk Assessment Danger to others risk factors: no Patient endorses recent thoughts of harming others: No    Substance Use Assessment Patient recently consumed alcohol:  no Patient recently used drugs: No  Patient is concerned about dependence or abuse of substances: No    Goals, Interventions and Follow-up Plan Goals: Increase healthy adjustment to current life circumstances Interventions: Mindfulness or Relaxation Training   Summary of Clinical Assessment  Victoria [name] is a 26 yr old woman that was present for her follow up.  She reports that she continues to have anxiety and depression and her mood is up and down.  She is most concerned about her health and has been referred to a specialist to determine diagnosis.  LCSW offered  education about common irrational fears and beliefs that contribute to anxiety. Reviewed and showed client how to use CBT/REBT strategies to recognize and re-frame irrational fears and self-talk as a means of increasing client's capacity to handle their anxiety more constructively.   Therapist will refer Victoria [name] to traditional therapy to assist with increasing mood stabilization at a quicker rate.   Follow-up Plan:  refer to [facility] counseling [place], LCSW

## 2021-10-23 NOTE — Progress Notes (Signed)
See Bh notes

## 2021-10-26 NOTE — BH Specialist Note (Signed)
[place] [facility] Follow Up Assessment  MRN: [id number] NAME: KYLEI PURINGTON Date: 10/26/21  Start time: 1230 End time: 1245 Total time: 15  Type of Contact: Follow up Call  Current concerns/stressors: depressive symptoms   Functional Assessment:  Sleep: fair Appetite: fair Coping ability: fair Patient taking medications as prescribed:    Current medications:  Outpatient Encounter Medications as of 10/22/2021  Medication Sig   etonogestrel (NEXPLANON) 68 MG IMPL implant 1 each by Subdermal route once.   fludrocortisone (FLORINEF) 0.1 MG tablet TAKE 1 TABLET BY MOUTH TWICE A DAY   fluticasone (FLONASE) 50 MCG/ACT nasal spray Place 1 spray into both nostrils daily.   hydrOXYzine (VISTARIL) 25 MG capsule Take 1 capsule (25 mg total) by mouth every 8 (eight) hours as needed.   ondansetron (ZOFRAN ODT) 8 MG disintegrating tablet Take 1 tablet (8 mg total) by mouth every 8 (eight) hours as needed for nausea or vomiting.   predniSONE (STERAPRED UNI-PAK 21 TAB) 10 MG (21) TBPK tablet Take by mouth daily. Take 6 tabs by mouth daily  for 2 days, then 5 tabs for 2 days, then 4 tabs for 2 days, then 3 tabs for 2 days, 2 tabs for 2 days, then 1 tab by mouth daily for 2 days   No facility-administered encounter medications on file as of 10/22/2021.    Self-harm and/or Suicidal Behaviors Risk Assessment Self-harm risk factors: no Patient endorses recent self injurious thoughts and/or behaviors: No   Suicide ideations: No plan to harm self or others   Danger to Others Risk Assessment Danger to others risk factors: no Patient endorses recent thoughts of harming others: No    Substance Use Assessment Patient recently consumed alcohol: No  Patient recently used drugs: No  Patient is concerned about dependence or abuse of substances: No    Goals, Interventions and Follow-up Plan Goals: Increase healthy adjustment to current life circumstances Interventions: Mindfulness or  Relaxation Training   Summary of Clinical Assessment  Gisell is a 26 yr old woman that was present for her follow up.  Redina continues to have difficulty with anxiousness, overwhelmed feelings, sad and poor energy.  She lost her job a few months ago and continues to have difficulty with her pay.  She continues to be concerned about her medical symptoms. Factors that contribute to client's ongoing depressive symptoms were discussed and include real and perceived feelings of isolation, criticism, rejection, shame and guilt.  Diavian has been connected to a traditional therapist and Writer will sign off on this service.   Follow-up Plan:  sign off on with [facility] so Patient can receive traditional counseling Marinda Elk, LCSW

## 2021-10-28 ENCOUNTER — Emergency Department (HOSPITAL_BASED_OUTPATIENT_CLINIC_OR_DEPARTMENT_OTHER)
Admission: EM | Admit: 2021-10-28 | Discharge: 2021-10-28 | Disposition: A | Discharge status: Home / Self Care | Payer: 59 | Attending: Emergency Medicine | Admitting: Emergency Medicine

## 2021-10-28 ENCOUNTER — Other Ambulatory Visit: Payer: Self-pay

## 2021-10-28 ENCOUNTER — Telehealth (INDEPENDENT_AMBULATORY_CARE_PROVIDER_SITE_OTHER): Payer: 59 | Admitting: Licensed Clinical Social Worker

## 2021-10-28 ENCOUNTER — Encounter (HOSPITAL_BASED_OUTPATIENT_CLINIC_OR_DEPARTMENT_OTHER): Payer: Self-pay | Admitting: Urology

## 2021-10-28 DIAGNOSIS — R1032 Left lower quadrant pain: Secondary | ICD-10-CM | POA: Insufficient documentation

## 2021-10-28 DIAGNOSIS — R519 Headache, unspecified: Secondary | ICD-10-CM | POA: Insufficient documentation

## 2021-10-28 DIAGNOSIS — F332 Major depressive disorder, recurrent severe without psychotic features: Secondary | ICD-10-CM

## 2021-10-28 LAB — URINALYSIS, MICROSCOPIC (REFLEX): Squamous Epithelial / HPF: NONE SEEN (ref 0–5)

## 2021-10-28 LAB — CBC WITH DIFFERENTIAL/PLATELET
Abs Immature Granulocytes: 0.02 10*3/uL (ref 0.00–0.07)
Basophils Absolute: 0 10*3/uL (ref 0.0–0.1)
Basophils Relative: 1 %
Eosinophils Absolute: 0 10*3/uL (ref 0.0–0.5)
Eosinophils Relative: 0 %
HCT: 40.8 % (ref 36.0–46.0)
Hemoglobin: 13.9 g/dL (ref 12.0–15.0)
Immature Granulocytes: 0 %
Lymphocytes Relative: 18 %
Lymphs Abs: 1.2 10*3/uL (ref 0.7–4.0)
MCH: 31.1 pg (ref 26.0–34.0)
MCHC: 34.1 g/dL (ref 30.0–36.0)
MCV: 91.3 fL (ref 80.0–100.0)
Monocytes Absolute: 0.5 10*3/uL (ref 0.1–1.0)
Monocytes Relative: 7 %
Neutro Abs: 4.7 10*3/uL (ref 1.7–7.7)
Neutrophils Relative %: 74 %
Platelets: 274 10*3/uL (ref 150–400)
RBC: 4.47 MIL/uL (ref 3.87–5.11)
RDW: 12.6 % (ref 11.5–15.5)
WBC: 6.4 10*3/uL (ref 4.0–10.5)
nRBC: 0 % (ref 0.0–0.2)

## 2021-10-28 LAB — URINALYSIS, ROUTINE W REFLEX MICROSCOPIC
Bilirubin Urine: NEGATIVE
Glucose, UA: NEGATIVE mg/dL
Hgb urine dipstick: NEGATIVE
Ketones, ur: NEGATIVE mg/dL
Nitrite: NEGATIVE
Protein, ur: NEGATIVE mg/dL
Specific Gravity, Urine: 1.015 (ref 1.005–1.030)
pH: 7 (ref 5.0–8.0)

## 2021-10-28 LAB — COMPREHENSIVE METABOLIC PANEL
ALT: 14 U/L (ref 0–44)
AST: 19 U/L (ref 15–41)
Albumin: 3.9 g/dL (ref 3.5–5.0)
Alkaline Phosphatase: 58 U/L (ref 38–126)
Anion gap: 7 (ref 5–15)
BUN: 9 mg/dL (ref 6–20)
CO2: 24 mmol/L (ref 22–32)
Calcium: 9 mg/dL (ref 8.9–10.3)
Chloride: 104 mmol/L (ref 98–111)
Creatinine, Ser: 0.8 mg/dL (ref 0.44–1.00)
GFR, Estimated: >60 mL/min (ref >60)
Glucose, Bld: 92 mg/dL (ref 70–99)
Potassium: 3.6 mmol/L (ref 3.5–5.1)
Sodium: 135 mmol/L (ref 135–145)
Total Bilirubin: 0.7 mg/dL (ref 0.3–1.2)
Total Protein: 6.8 g/dL (ref 6.5–8.1)

## 2021-10-28 LAB — PREGNANCY, URINE: Preg Test, Ur: NEGATIVE

## 2021-10-28 LAB — LIPASE, BLOOD: Lipase: 28 U/L (ref 11–51)

## 2021-10-28 MED ORDER — PANTOPRAZOLE SODIUM 20 MG PO TBEC: 20.0000 mg | DELAYED_RELEASE_TABLET | Freq: Every day | ORAL | 1 refills | Status: DC

## 2021-10-28 NOTE — ED Triage Notes (Signed)
Headache, brainfog, and memory issues x 5 months Stomach pain LUQ x 1week, reports nausea but no vomiting "Left leg hurting randomly and giving out x 2 months" Denies Fever NAD now, A&Ox4

## 2021-10-28 NOTE — Discharge Instructions (Addendum)
Please return to the emergency department for worsening symptoms.  Please call low-power neurology to schedule appointment.  You can say that you were seen evaluate in the emergency department and this should bypass the need for referral.

## 2021-10-28 NOTE — BH Specialist Note (Signed)
[facility] Treatment Plan Team Note  MRN: [id number] NAME: Victoria [name]  DATE: 10/28/21  Start time:  330p End time:  335 Total time:  5 min  Total number of [facility] Treatment Team Plan encounters: 1/4  Treatment Team Attendees: Royal Piedra, LCSW & Dr. Modesta Messing  Diagnoses: No diagnosis found.  Goals, Interventions and Follow-up Plan Goals: Increase healthy adjustment to current life circumstances Interventions: Mindfulness or Relaxation Training Medication Management Recommendations: n/a we will sign off Follow-up Plan: sign off on with [facility] so Patient can receive traditional counseling  History of the present illness Presenting Problem/Current Symptoms: continued symptoms of DX  Psychiatric History  Depression: Yes Anxiety: Yes Mania: No Psychosis: No PTSD symptoms: No  Past Psychiatric History/Hospitalization(s): Hospitalization for psychiatric illness: No Prior Suicide Attempts: No Prior Self-injurious behavior: No    Self-harm Behaviors Risk Assessment   Screenings PHQ-9 Assessments:  Depression screen [facility] 2/9 06/22/2021 12/02/2020 09/01/2020  Decreased Interest 0 0 0  Down, Depressed, Hopeless 0 1 0  PHQ - 2 Score 0 1 0  Altered sleeping 0 - 0  Tired, decreased energy 0 - 0  Change in appetite 0 - 0  Feeling bad or failure about yourself  0 - 0  Trouble concentrating 0 - 1  Moving slowly or fidgety/restless 0 - 0  Suicidal thoughts 0 - 0  PHQ-9 Score 0 - 1  Difficult doing work/chores Not difficult at all - -   GAD-7 Assessments:  GAD 7 : Generalized Anxiety Score 08/28/2020  Nervous, Anxious, on Edge 1  Control/stop worrying 0  Worry too much - different things 1  Trouble relaxing 0  Restless 1  Easily annoyed or irritable 2  Afraid - awful might happen 0  Total GAD 7 Score 5    Past Medical History Past Medical History:  Diagnosis Date   Abnormal Papanicolaou smear of cervix with positive human papilloma virus (HPV)  test 09/08/2020   Pap was LSIL +HPV:  Needs colpo per ASCCP guidelines,  CIN 3+ immediate risk is 4.3%______________   Bipolar disorder ([place])    Mitral valve prolapse    2D echo 09/2021 showed mild anterior mitral valve prolapse with mild MR   POTS (postural orthostatic tachycardia syndrome)     Vital signs: There were no vitals filed for this visit.  Allergies:  Allergies as of 10/28/2021   (No Known Allergies)    Medication History Current medications:  Outpatient Encounter Medications as of 10/28/2021  Medication Sig   etonogestrel (NEXPLANON) 68 MG IMPL implant 1 each by Subdermal route once.   fludrocortisone (FLORINEF) 0.1 MG tablet TAKE 1 TABLET BY MOUTH TWICE A DAY   fluticasone (FLONASE) 50 MCG/ACT nasal spray Place 1 spray into both nostrils daily.   hydrOXYzine (VISTARIL) 25 MG capsule Take 1 capsule (25 mg total) by mouth every 8 (eight) hours as needed.   ondansetron (ZOFRAN ODT) 8 MG disintegrating tablet Take 1 tablet (8 mg total) by mouth every 8 (eight) hours as needed for nausea or vomiting.   predniSONE (STERAPRED UNI-PAK 21 TAB) 10 MG (21) TBPK tablet Take by mouth daily. Take 6 tabs by mouth daily  for 2 days, then 5 tabs for 2 days, then 4 tabs for 2 days, then 3 tabs for 2 days, 2 tabs for 2 days, then 1 tab by mouth daily for 2 days   No facility-administered encounter medications on file as of 10/28/2021.     Scribe for Treatment Team: Lupita Raider  Stori Royse, LCSW

## 2021-10-28 NOTE — ED Provider Notes (Signed)
[facility] EMERGENCY DEPARTMENT Provider Note   CSN: [id number] Arrival date & time: 10/28/21  1335     History Chief Complaint  Patient presents with   Abdominal Pain   Headache    Victoria [name] is a 26 y.o. female with history of POTS, depression, generalized anxiety disorder who presents the emergency department with a several month history of headaches, and numbness.  She states that the numbness is primarily localized to the left side and is sometimes severe to the point that she drops things.  She also states that she has been having epigastric and left lower quadrant abdominal pain primarily after meals.  She also states that she has been very fatigued and having trouble focusing throughout the day.  She is seen her primary care doctor numerous times for the symptoms and she states nothing has been done apart from referral to cardiology where she had a normal work-up.  She currently denies any symptoms while in the department.   Abdominal Pain Headache Associated symptoms: abdominal pain       Home Medications Prior to Admission medications   Medication Sig Start Date End Date Taking? Authorizing Provider  pantoprazole (PROTONIX) 20 MG tablet Take 1 tablet (20 mg total) by mouth daily. 10/28/21  Yes Pearley Millington M, PA-C  etonogestrel (NEXPLANON) 68 MG IMPL implant 1 each by Subdermal route once.    [provider]  fludrocortisone (FLORINEF) 0.1 MG tablet TAKE 1 TABLET BY MOUTH TWICE A DAY 09/02/21   Anabel Halon, MD  fluticasone (FLONASE) 50 MCG/ACT nasal spray Place 1 spray into both nostrils daily. 10/13/21   Coralyn Mark, NP  hydrOXYzine (VISTARIL) 25 MG capsule Take 1 capsule (25 mg total) by mouth every 8 (eight) hours as needed. 09/21/21   Anabel Halon, MD  ondansetron (ZOFRAN ODT) 8 MG disintegrating tablet Take 1 tablet (8 mg total) by mouth every 8 (eight) hours as needed for nausea or vomiting. 10/24/20   Lazaro Arms, MD   predniSONE (STERAPRED UNI-PAK 21 TAB) 10 MG (21) TBPK tablet Take by mouth daily. Take 6 tabs by mouth daily  for 2 days, then 5 tabs for 2 days, then 4 tabs for 2 days, then 3 tabs for 2 days, 2 tabs for 2 days, then 1 tab by mouth daily for 2 days 10/13/21   Coralyn Mark, NP      Allergies    Patient has no known allergies.    Review of Systems   Review of Systems  Gastrointestinal:  Positive for abdominal pain.  Neurological:  Positive for headaches.  All other systems reviewed and are negative.  Physical Exam Updated Vital Signs BP 127/78 (BP Location: Left Arm)    Pulse 91    Temp 98 F (36.7 C) (Oral)    Resp 16    Ht 5\' 4"  (1.626 m)    Wt 79.8 kg    LMP 09/29/2021    SpO2 99%    BMI 30.21 kg/m  Physical Exam Vitals and nursing note reviewed.  Constitutional:      General: She is not in acute distress.    Appearance: Normal appearance.  HENT:     Head: Normocephalic and atraumatic.  Eyes:     General:        Right eye: No discharge.        Left eye: No discharge.  Cardiovascular:     Comments: Regular rate and rhythm.  S1/S2 are distinct without  any evidence of murmur, rubs, or gallops.  Radial pulses are 2+ bilaterally.  Dorsalis pedis pulses are 2+ bilaterally.  No evidence of pedal edema. Pulmonary:     Comments: Clear to auscultation bilaterally.  Normal effort.  No respiratory distress.  No evidence of wheezes, rales, or rhonchi heard throughout. Abdominal:     General: Abdomen is flat. Bowel sounds are normal. There is no distension.     Tenderness: There is no abdominal tenderness. There is no guarding or rebound.     Comments: No CVA tenderness  Musculoskeletal:        General: Normal range of motion.     Cervical back: Neck supple.  Skin:    General: Skin is warm and dry.     Findings: No rash.  Neurological:     General: No focal deficit present.     Mental Status: She is alert.     Comments: Cranial nerves II through XII are intact.  Patient's  pupils are equal round and reactive to light.  Normal extraocular movements without any signs of nystagmus.  5/5 strength to the upper and lower extremities.  Normal sensation to the upper and lower extremities.  She is no dysmetria on finger-nose bilaterally.  Normal heel-to-shin.  No pronator drift.  Normal gait.  Psychiatric:        Mood and Affect: Mood normal.        Speech: Speech normal.        Behavior: Behavior normal.        Thought Content: Thought content normal.    ED Results / Procedures / Treatments   Labs (all labs ordered are listed, but only abnormal results are displayed) Labs Reviewed  URINALYSIS, ROUTINE W REFLEX MICROSCOPIC - Abnormal; Notable for the following components:      Result Value   Leukocytes,Ua SMALL (*)    All other components within normal limits  URINALYSIS, MICROSCOPIC (REFLEX) - Abnormal; Notable for the following components:   Bacteria, UA RARE (*)    All other components within normal limits  PREGNANCY, URINE  CBC WITH DIFFERENTIAL/PLATELET  COMPREHENSIVE METABOLIC PANEL  LIPASE, BLOOD    EKG None  Radiology No results found.  Procedures Procedures    Medications Ordered in ED Medications - No data to display  ED Course/ Medical Decision Making/ A&P                           Medical Decision Making Amount and/or Complexity of Data Reviewed Labs: ordered.   Victoria [name] is a 26 y.o. female with significant comorbidities affecting her care to include generalized anxiety disorder and depression who presents to the emergency department with multiple complaints.  Given the time course, I do have a low suspicion for emergent causes of her symptoms at this time.  I did consider however believe less likely GI ulcer, diverticulitis, torsion, and kidney stone.  With respect to her neuro complaints, I considered space-occupying lesion, stroke, or infectious etiologies.  Vital signs are completed normal in department today and her  neurological exam is completely normal making these less likely.  Shared decision-making was done at the bedside whether not to order imaging.  Patient wishes to decline at this time.  I discussed that I would likely can give her referral to neurology for further evaluation.  She was amenable with this plan.  I will order some basic labs and will reassess the patient.  Currently she is  resting comfortably in department in no acute distress and has no somatic complaints at this time.  I personally reviewed the labs which not reveal any significant abnormalities over the CBC, CMP, lipase, or UA.  No further evidence to suggest of emergent pathology at this time.  On reevaluation, patient is feeling about the same.  She is still currently asymptomatic.  I will plan to have her follow-up with neurology.  For further evaluation and possible imaging of the head.  She has a follow-up appointment in April with her primary care doctor we discussed possibly getting thyroid testing at that time.  Clinically, she is well-appearing and safe for discharge.  Social determinants of health include tobacco use and depression.  Final Clinical Impression(s) / ED Diagnoses Final diagnoses:  Nonintractable headache, unspecified chronicity pattern, unspecified headache type    Rx / DC Orders ED Discharge Orders          Ordered    pantoprazole (PROTONIX) 20 MG tablet  Daily        10/28/21 1539              Honor Loh [place], [place] 10/28/21 1540    Long, Arlyss Repress, MD 11/04/21 2010

## 2021-10-28 NOTE — Progress Notes (Signed)
[facility] ([facility]) Psychiatric Consultant Case Review   Victoria [name] is a 26 y.o. year old female with a history of depression, anxiety, r/o bipolar disorder, POTS.  Worsening in depression and anxiety in the setting of unemployment.  She was reportedly diagnosed with bipolar disorder in the past, and was used to being on lamotrigine and sertraline.  She requested individual therapy, and she has started to see the one.  She was also referred to her psychiatrist based on the chart review.   Assessment/Provisional Diagnosis # MDD, unspecified anxiety disorder # history of bipolar disorder Will not start any psychotropics given she is referred to psychiatry.  Noted that although she has history of bipolar disorder, she has not reported any episodes consistent with bipolar disorder during the interview with [facility] specialist.   Recommendation  - she was referred to psychiatry  - she sees individual therapy  Thank you for your consult. We will sign off the patient. Please contact [facility]  for any questions or concerns.   The above treatment considerations and suggestions are based on consultation with the [facility] specialist and/or PCP and a review of information available in the shared registry and the patients Electronic Health Record (EHR). I have not personally examined the patient. All recommendations should be implemented with consideration of the patient's relevant prior history and current clinical status. Please feel free to call me with any questions about the care of this patient.

## 2021-10-29 ENCOUNTER — Ambulatory Visit (INDEPENDENT_AMBULATORY_CARE_PROVIDER_SITE_OTHER): Payer: 59 | Admitting: Psychology

## 2021-10-29 DIAGNOSIS — F319 Bipolar disorder, unspecified: Secondary | ICD-10-CM

## 2021-10-29 DIAGNOSIS — R69 Illness, unspecified: Secondary | ICD-10-CM | POA: Diagnosis not present

## 2021-10-29 NOTE — Progress Notes (Signed)
[place] Counselor/Therapist Progress Note  Patient ID: Victoria [name], MRN: [id number],    Date: 10/29/2021  Time Spent: 11:00am-11:44am   Treatment Type: Individual Therapy  pt is seen for a virtual video visit via webex.  Pt joins from her home and counselor from her home office.   Reported Symptoms: stress from medical issues, some discouragement  Mental Status Exam: Appearance:  Well Groomed     Behavior: Appropriate  Motor: Normal  Speech/Language:  Normal Rate  Affect: Appropriate  Mood: depressed  Thought process: normal  Thought content:   WNL  Sensory/Perceptual disturbances:   WNL  Orientation: oriented to person, place, time/date, and situation  Attention: Good  Concentration: Good  Memory: WNL  Fund of knowledge:  Good  Insight:   Good  Judgment:  Good  Impulse Control: Good   Risk Assessment: Danger to Self:  No Self-injurious Behavior: No Danger to Others: No Duty to Warn:no Physical Aggression / Violence:No  Access to Firearms a concern: No  Gang Involvement:No   Subjective: Counselor assessed pt current functioning per pt report.  Processed w/ pt concerns w/ medical issues and some discouragement.  Discussed next steps taken towards advocacy for health.  Explored w/pt thoughts re: continuing to work w/ providers to identify what's behind symptoms.  Assisted pt w/ reframing distortions and acknowledging advocacy will have to for self.  Reflected some positives that have decreased some stress.   Pt affect slightly down.  Pt reported has headache.  Pt reported that went to ED yesterday as feeling so bad and hoping for some answers.  Pt reported that doctor encouraged a referral to neurology w/ what she has been experiencing. Pt reports that she has been thinking "is it worth it" and basing on past going through providers for years, before discovering pots and being dismissed for anxiety.  Pt reports her anxiety has been low and did receive her  back pay from last job finally so reduced some stressors.  Pt is encouraging self w/ steps to take and acknowledging that being positive w/ self has been helping.    Interventions: Cognitive Behavioral Therapy and positive self talk.   Diagnosis:Bipolar I disorder ([place])  Plan: f/u w/ pt in 2 weeks for counseling to assist coping w/ stress, anxiety and mood stability.  See tx plan on file in Therapy Charts. Pt to f/u w/ referral to neurology and pt reports exploring new PCP.   Treatment Plan 01/08/21 Client Abilities/Strengths  supports: my maternal uncle and his friend [name] and my best friend- who I met in college. pt enjoys her dogs, reading and singing  Client Treatment Preferences  biweekly counseling to start. pt is being referred to psychiatrist by her PCP to manage meds.  Client Statement of Needs  pt: "i feel like i should process some things and deal w/ some things to help me take care of myself and know what I want" be able to cope and deal w/ the things that have hit me hard recent in a positive way."  Treatment Level  outpatient counseling  Symptoms  Autonomic hyperactivity (e.g., palpitations, shortness of breath, dry mouth, trouble swallowing, nausea, diarrhea).: No Description Entered (Status: maintained). Decrease or loss of appetite.: No Description Entered (Status: maintained). Depressed or irritable mood.: No Description Entered (Status: maintained). History of at least one hypomanic, manic, or mixed mood episode.: No Description Entered (Status: maintained). History of chronic or recurrent depression for which the client has taken antidepressant medication, been hospitalized, had  outpatient treatment, or had a course of electroconvulsive therapy.: No Description Entered (Status: maintained). Hypervigilance (e.g., feeling constantly on edge, experiencing concentration difficulties, having trouble falling or staying asleep, exhibiting a general state of irritability).: No  Description Entered (Status: maintained). Lack of energy.: No Description Entered (Status: maintained). Motor tension (e.g., restlessness, tiredness, shakiness, muscle tension).: No Description Entered (Status: maintained). Sleeplessness or hypersomnia.: No Description Entered (Status: maintained).  Problems Addressed  Bipolar Disorder - Depression, Bipolar Disorder - Depression, Anxiety  Goals 1. Alleviate depressive symptoms and return to previous level of effective functioning. 2. Develop healthy cognitive patterns and beliefs about self and the world that lead to alleviation and help prevent the relapse of mood episodes. Objective Discuss and resolve troubling personal and interpersonal issues. Target Date: 2022-01-08 Frequency: Daily  Progress: 0 Modality: individual  Related Interventions Use interpersonal therapy techniques to explore and resolve issues surrounding grief, role disputes, role transitions, and social skills deficits; provide support and strategies for resolving identified interpersonal issues. Objective Identify and replace thoughts and behaviors that trigger manic or depressive symptoms. Target Date: 2022-01-08 Frequency: Daily  Progress: 0 Modality: individual  Related Interventions Use cognitive therapy techniques to explore and educate the client about cognitive biases that trigger his/her elevated or depressive mood (see Cognitive Therapy for Bipolar Disorder by Lanny Hurst al.). 3. Enhance ability to effectively cope with the full variety of life's worries and anxieties. Objective Learn and implement problem-solving strategies for realistically addressing worries. Target Date: 2022-01-08 Frequency: Daily  Progress: 0 Modality: individual  Related Interventions Teach the client problem-solving strategies involving specifically defining a problem, generating options for addressing it, evaluating the pros and cons of each option, selecting and implementing an optional  action, and reevaluating and refining the action (or assign "Applying Problem-Solving to Interpersonal Conflict" in the Adult Psychotherapy Homework Planner by Bryn Gulling). Objective Identify, challenge, and replace biased, fearful self-talk with positive, realistic, and empowering self-talk. Target Date: 2022-01-08 Frequency: Daily  Progress: 0 Modality: individual  Related Interventions Explore the client's schema and self-talk that mediate his/her fear response; assist him/her in challenging the biases; replace the distorted messages with reality-based alternatives and positive, realistic self-talk that will increase his/her self-confidence in coping with irrational fears (see Cognitive Therapy of Anxiety Disorders by Alison Stalling). Objective Learn and implement calming skills to reduce overall anxiety and manage anxiety symptoms. Target Date: 2022-01-08 Frequency: Daily  Progress: 0 Modality: individual  Related Interventions Teach the client calming/relaxation skills (e.g., applied relaxation, progressive muscle relaxation, cue controlled relaxation; mindful breathing; biofeedback) and how to discriminate better between relaxation and tension; teach the client how to apply these skills to his/her daily life (e.g., New Directions in Progressive Muscle Relaxation by Casper Harrison, and Hazlett-Stevens; Treating Generalized Anxiety Disorder by Rygh and Amparo Bristol). Pt participated in development of plan and provided verbal consent.   Jan Fireman [facility]                  [place], [facility]

## 2021-10-30 ENCOUNTER — Ambulatory Visit: Payer: Self-pay | Admitting: Physical Therapy

## 2021-11-08 ENCOUNTER — Other Ambulatory Visit: Payer: Self-pay | Admitting: Internal Medicine

## 2021-11-08 DIAGNOSIS — G90A Postural orthostatic tachycardia syndrome (POTS): Secondary | ICD-10-CM

## 2021-11-11 ENCOUNTER — Ambulatory Visit: Payer: 59 | Admitting: Physical Therapy

## 2021-11-12 ENCOUNTER — Ambulatory Visit (INDEPENDENT_AMBULATORY_CARE_PROVIDER_SITE_OTHER): Payer: 59 | Admitting: Psychology

## 2021-11-12 DIAGNOSIS — F319 Bipolar disorder, unspecified: Secondary | ICD-10-CM | POA: Diagnosis not present

## 2021-11-12 DIAGNOSIS — R69 Illness, unspecified: Secondary | ICD-10-CM | POA: Diagnosis not present

## 2021-11-12 NOTE — Progress Notes (Signed)
[place] Counselor/Therapist Progress Note  Patient ID: Victoria [name], MRN: [id number],    Date: 11/12/2021  Time Spent: 11:00am-11:42am   Treatment Type: Individual Therapy  pt is seen for a virtual video visit via webex.  Pt joins from her home and counselor from her home office.   Reported Symptoms: stress from medical issues, some discouragement  Mental Status Exam: Appearance:  Well Groomed     Behavior: Appropriate  Motor: Normal  Speech/Language:  Normal Rate  Affect: Appropriate  Mood: depressed  Thought process: normal  Thought content:   WNL  Sensory/Perceptual disturbances:   WNL  Orientation: oriented to person, place, time/date, and situation  Attention: Good  Concentration: Good  Memory: WNL  Fund of knowledge:  Good  Insight:   Good  Judgment:  Good  Impulse Control: Good   Risk Assessment: Danger to Self:  No Self-injurious Behavior: No Danger to Others: No Duty to Warn:no Physical Aggression / Violence:No  Access to Firearms a concern: No  Gang Involvement:No   Subjective: Counselor assessed pt current functioning per pt report.  Processed w/ pt feeling frustrated and discouraged.  Assisted pt in recognizing that she is taking steps she needs for her health and being health advocates.  Encouraged pt self compassion and recognizing she is being impacted by her health and not being lazy.  Pt affect congruent w/ report of feeling down, discouraged. Pt reported that she continues to struggle w/ multiple headaches a week that are debilitating.  Pt reported she called to neurologist last week and wasn't able to schedule w/ out referral from PCP.  Pt worried that PCP won't make referral.  Pt discussed thoughts of being lazy as things not getting down.  Pt was able to reframe and acknowledged that her health is impacting her functioning and that she is doing things for self and making progress.     Interventions: Cognitive Behavioral Therapy and  positive self talk.   Diagnosis:Bipolar I disorder ([place])  Plan: f/u w/ pt in 2 weeks for counseling to assist coping w/ stress, anxiety and mood stability.  See tx plan on file in Therapy Charts. Pt to f/u w/ referral to neurology and pt reports exploring new PCP.   Treatment Plan 01/08/21 Client Abilities/Strengths  supports: my maternal uncle and his friend [name] and my best friend- who I met in college. pt enjoys her dogs, reading and singing  Client Treatment Preferences  biweekly counseling to start. pt is being referred to psychiatrist by her PCP to manage meds.  Client Statement of Needs  pt: "i feel like i should process some things and deal w/ some things to help me take care of myself and know what I want" be able to cope and deal w/ the things that have hit me hard recent in a positive way."  Treatment Level  outpatient counseling  Symptoms  Autonomic hyperactivity (e.g., palpitations, shortness of breath, dry mouth, trouble swallowing, nausea, diarrhea).: No Description Entered (Status: maintained). Decrease or loss of appetite.: No Description Entered (Status: maintained). Depressed or irritable mood.: No Description Entered (Status: maintained). History of at least one hypomanic, manic, or mixed mood episode.: No Description Entered (Status: maintained). History of chronic or recurrent depression for which the client has taken antidepressant medication, been hospitalized, had outpatient treatment, or had a course of electroconvulsive therapy.: No Description Entered (Status: maintained). Hypervigilance (e.g., feeling constantly on edge, experiencing concentration difficulties, having trouble falling or staying asleep, exhibiting a general state of irritability).:  No Description Entered (Status: maintained). Lack of energy.: No Description Entered (Status: maintained). Motor tension (e.g., restlessness, tiredness, shakiness, muscle tension).: No Description Entered (Status: maintained).  Sleeplessness or hypersomnia.: No Description Entered (Status: maintained).  Problems Addressed  Bipolar Disorder - Depression, Bipolar Disorder - Depression, Anxiety  Goals 1. Alleviate depressive symptoms and return to previous level of effective functioning. 2. Develop healthy cognitive patterns and beliefs about self and the world that lead to alleviation and help prevent the relapse of mood episodes. Objective Discuss and resolve troubling personal and interpersonal issues. Target Date: 2022-01-08 Frequency: Daily  Progress: 0 Modality: individual  Related Interventions Use interpersonal therapy techniques to explore and resolve issues surrounding grief, role disputes, role transitions, and social skills deficits; provide support and strategies for resolving identified interpersonal issues. Objective Identify and replace thoughts and behaviors that trigger manic or depressive symptoms. Target Date: 2022-01-08 Frequency: Daily  Progress: 0 Modality: individual  Related Interventions Use cognitive therapy techniques to explore and educate the client about cognitive biases that trigger his/her elevated or depressive mood (see Cognitive Therapy for Bipolar Disorder by Lanny Hurst al.). 3. Enhance ability to effectively cope with the full variety of life's worries and anxieties. Objective Learn and implement problem-solving strategies for realistically addressing worries. Target Date: 2022-01-08 Frequency: Daily  Progress: 0 Modality: individual  Related Interventions Teach the client problem-solving strategies involving specifically defining a problem, generating options for addressing it, evaluating the pros and cons of each option, selecting and implementing an optional action, and reevaluating and refining the action (or assign "Applying Problem-Solving to Interpersonal Conflict" in the Adult Psychotherapy Homework Planner by Bryn Gulling). Objective Identify, challenge, and replace biased,  fearful self-talk with positive, realistic, and empowering self-talk. Target Date: 2022-01-08 Frequency: Daily  Progress: 0 Modality: individual  Related Interventions Explore the client's schema and self-talk that mediate his/her fear response; assist him/her in challenging the biases; replace the distorted messages with reality-based alternatives and positive, realistic self-talk that will increase his/her self-confidence in coping with irrational fears (see Cognitive Therapy of Anxiety Disorders by Alison Stalling). Objective Learn and implement calming skills to reduce overall anxiety and manage anxiety symptoms. Target Date: 2022-01-08 Frequency: Daily  Progress: 0 Modality: individual  Related Interventions Teach the client calming/relaxation skills (e.g., applied relaxation, progressive muscle relaxation, cue controlled relaxation; mindful breathing; biofeedback) and how to discriminate better between relaxation and tension; teach the client how to apply these skills to his/her daily life (e.g., New Directions in Progressive Muscle Relaxation by Casper Harrison, and Hazlett-Stevens; Treating Generalized Anxiety Disorder by Rygh and Amparo Bristol). Pt participated in development of plan and provided verbal consent.       Jan Fireman, [facility]

## 2021-11-13 ENCOUNTER — Ambulatory Visit: Payer: 59 | Attending: Women's Health | Admitting: Physical Therapy

## 2021-11-13 ENCOUNTER — Encounter: Payer: Self-pay | Admitting: Physical Therapy

## 2021-11-13 ENCOUNTER — Other Ambulatory Visit: Payer: Self-pay

## 2021-11-13 DIAGNOSIS — R293 Abnormal posture: Secondary | ICD-10-CM | POA: Insufficient documentation

## 2021-11-13 DIAGNOSIS — M6281 Muscle weakness (generalized): Secondary | ICD-10-CM | POA: Insufficient documentation

## 2021-11-13 DIAGNOSIS — R279 Unspecified lack of coordination: Secondary | ICD-10-CM | POA: Insufficient documentation

## 2021-11-13 DIAGNOSIS — R252 Cramp and spasm: Secondary | ICD-10-CM | POA: Insufficient documentation

## 2021-11-13 DIAGNOSIS — R2689 Other abnormalities of gait and mobility: Secondary | ICD-10-CM | POA: Insufficient documentation

## 2021-11-13 NOTE — Therapy (Signed)
[facility] [place], [place], [address] Phone: [phone]   Fax:  [phone]  Physical Therapy Treatment  Patient Details  Name: Victoria [name] MRN: [id number] Date of Birth: 1995-11-25 Referring Provider (PT): Roma Schanz, [place]   Encounter Date: 11/13/2021   PT End of Session - 11/13/21 1437     Visit Number 23    Date for PT Re-Evaluation 11/21/21    Authorization Type AETNA    Authorization - Visit Number 67    PT Start Time [id number]    PT Stop Time [id number]    PT Time Calculation (min) 39 min    Activity Tolerance Patient tolerated treatment well    Behavior During Therapy [facility] for tasks assessed/performed             Past Medical History:  Diagnosis Date   Abnormal Papanicolaou smear of cervix with positive human papilloma virus (HPV) test 09/08/2020   Pap was LSIL +HPV:  Needs colpo per ASCCP guidelines,  CIN 3+ immediate risk is 4.3%______________   Bipolar disorder ([place])    Mitral valve prolapse    2D echo 09/2021 showed mild anterior mitral valve prolapse with mild MR   POTS (postural orthostatic tachycardia syndrome)     Past Surgical History:  Procedure Laterality Date   EXTRACORPOREAL CIRCULATION     LASER ABLATION CONDOLAMATA N/A 10/24/2020   Procedure: LASER ABLATION of the cervix;  Surgeon: Florian Buff, MD;  Location: [facility];  Service: Gynecology;  Laterality: N/A;    There were no vitals filed for this visit.   Subjective Assessment - 11/13/21 1604     Subjective No current complaints    Currently in Pain? No/denies             Treatment: Patient seen for aquatic therapy today.  Treatment took place in water 2.5-4 feet deep depending upon activity.  Pt entered the pool via stairs, mild use of rails. Water temp 88 degrees F. Pt requires buoyancy of water for support and to offload joints with strengthening exercises.    75% depth: water walking with gentle UE movements  10x each direction. High knee marching 7 lengths with blue noodle for postural support. Wall exs including: 15x Bil hip ways including circumduction. Core compressions with neck pillow 5 sec hold 10x, Underwater bicycle with yellow noodle behind pt 44min, then 3 min continuous.                              PT Short Term Goals - 08/21/21 1020       PT SHORT TERM GOAL #1   Title Pt to be I with HEP    Time 6    Period Weeks    Status On-going    Target Date 06/05/21      PT SHORT TERM GOAL #2   Title pt to demonstrated improved pain with vaginal penetration to no more than 4/10 with size 2 dialator to equivalent sizing for progression to tolerate vaginal penetration.    Time 6    Period Weeks    Status Achieved    Target Date 06/05/21      PT SHORT TERM GOAL #3   Title pt to demonstrate I ability to self correct and complete diaphragmatic breathing technique to promote improved pelvic relaxation    Time 6    Period Weeks    Status Achieved  Target Date 06/05/21               PT Long Term Goals - 08/21/21 1020       PT LONG TERM GOAL #1   Title pt to be I with advanced HEP    Time 4    Period Months    Status On-going      PT LONG TERM GOAL #2   Title pt to demonstrate improved bil hip strength to at least 5/5 globally for improved functional mobility and decreased compensatory strategies   4/5 in extension and abduction; 5/5 all else   Time 4    Period Months    Status On-going      PT LONG TERM GOAL #3   Title pt to report no more than 2/10 pain with size 4 vaginal dialator or size equivalent for progression to tolerate vaginal penetration   0/10   Time 4    Period Months    Status Achieved      PT LONG TERM GOAL #4   Title pt to report no more than 2/10 pain with size 6 vaginal dialator or size equivalent for progression to tolerate vaginal penetration and intercourse    Time 3    Period Months    Status New    Target Date 11/21/21       PT LONG TERM GOAL #5   Title pt to demonstrate ability to contract pelvic floor at 4/5 stretch consistently and isometric holds for 7s and full relaxation between reps to improve pelvic stability and proper mobility    Time 3    Period Months    Status New    Target Date 11/21/21                   Plan - 11/13/21 1605     Clinical Impression Statement Pt returns to aquatic PT after a break from the holidays and then being sick in January. Pt was eased into her routine but tolerated it well enough to advance to adding some resisatnce to her postural exercises and perfoming the underwater bicylce that requires good trunk control. No pain reported throughout the session.    Personal Factors and Comorbidities Time since onset of injury/illness/exacerbation;Fitness    Examination-Activity Limitations Other    Stability/Clinical Decision Making Evolving/Moderate complexity    Rehab Potential Good    PT Frequency 1x / week    PT Treatment/Interventions ADLs/Self Care Home Management;Functional mobility training;Therapeutic activities;Therapeutic exercise;Manual techniques;Passive range of motion;Dry needling;Taping;Patient/family education;Neuromuscular re-education;Energy conservation;Aquatic Therapy    PT Next Visit Plan hamstring,glute,adductor,trunk mobility and strength    PT Home Exercise Plan Access Code [email or web address]             Patient will benefit from skilled therapeutic intervention in order to improve the following deficits and impairments:  Decreased coordination, Decreased range of motion, Decreased endurance, Decreased strength, Decreased mobility, Postural dysfunction, Improper body mechanics, Impaired flexibility, Pain  Visit Diagnosis: Lack of coordination  Muscle weakness (generalized)  Cramp and spasm  Other abnormalities of gait and mobility  Abnormal posture  Unspecified lack of coordination     Problem List Patient Active Problem List    Diagnosis Date Noted   Mitral valve prolapse 09/11/2021   Chest pain 06/22/2021   Anxiety with depression 06/22/2021   Chronic nonintractable headache 06/22/2021   Abnormal Pap smear of cervix 09/08/2020   Nexplanon in place 08/28/2020   Major depressive disorder, recurrent, severe without psychotic features ([place]) 07/01/2018  Generalized anxiety disorder 07/01/2018   Moderate episode of recurrent major depressive disorder ([place]) 04/11/2018   Major depression, recurrent ([place]) 01/07/2014   Autonomic dysreflexia 10/24/2012   Vasovagal near syncope 05/24/2012   Postural orthostatic tachycardia syndrome 08/27/2011    Doniel Maiello, PTA 11/13/2021, 4:08 PM  [facility] [address] [place], [place], [address] Phone: [phone]   Fax:  [phone]  Name: Victoria [name] MRN: [id number] Date of Birth: 29-Aug-1996

## 2021-11-20 ENCOUNTER — Encounter: Payer: Self-pay | Admitting: Physical Therapy

## 2021-11-20 ENCOUNTER — Ambulatory Visit: Payer: 59 | Admitting: Physical Therapy

## 2021-11-20 ENCOUNTER — Other Ambulatory Visit: Payer: Self-pay

## 2021-11-20 DIAGNOSIS — R252 Cramp and spasm: Secondary | ICD-10-CM | POA: Diagnosis not present

## 2021-11-20 DIAGNOSIS — R293 Abnormal posture: Secondary | ICD-10-CM | POA: Diagnosis not present

## 2021-11-20 DIAGNOSIS — M6281 Muscle weakness (generalized): Secondary | ICD-10-CM | POA: Diagnosis not present

## 2021-11-20 DIAGNOSIS — R2689 Other abnormalities of gait and mobility: Secondary | ICD-10-CM | POA: Diagnosis not present

## 2021-11-20 DIAGNOSIS — R279 Unspecified lack of coordination: Secondary | ICD-10-CM

## 2021-11-20 NOTE — Therapy (Signed)
[facility] @ [facility] [address] [place], [place], [address] Phone: [phone]   Fax:  [phone]  Physical Therapy Treatment  Patient Details  Name: Victoria [name] MRN: [id number] Date of Birth: 16-Mar-1996 Referring Provider (PT): Cheral Marker, [place]   Encounter Date: 11/20/2021   PT End of Session - 11/20/21 1438     Visit Number 24    Date for PT Re-Evaluation 11/21/21    Authorization Type AETNA    PT Start Time 1436    PT Stop Time 1515    PT Time Calculation (min) 39 min    Activity Tolerance Patient tolerated treatment well    Behavior During Therapy [facility] for tasks assessed/performed             Past Medical History:  Diagnosis Date   Abnormal Papanicolaou smear of cervix with positive human papilloma virus (HPV) test 09/08/2020   Pap was LSIL +HPV:  Needs colpo per ASCCP guidelines,  CIN 3+ immediate risk is 4.3%______________   Bipolar disorder ([facility])    Mitral valve prolapse    2D echo 09/2021 showed mild anterior mitral valve prolapse with mild MR   POTS (postural orthostatic tachycardia syndrome)     Past Surgical History:  Procedure Laterality Date   EXTRACORPOREAL CIRCULATION     LASER ABLATION CONDOLAMATA N/A 10/24/2020   Procedure: LASER ABLATION of the cervix;  Surgeon: Lazaro Arms, MD;  Location: [facility];  Service: Gynecology;  Laterality: N/A;    There were no vitals filed for this visit.   Treatment: Pt arrives for aquatic physical therapy. Treatment took place in 3.5-5.5 feet of water. Water temperature was 90 degrees F. Pt entered the pool via stairs with mild use of hand rails. Pt requires buoyancy of water for support and to offload joints with strengthening exercises.    Standing in 50%- 75% depth water pt performed water walking in all 4 directions 10x. VC for speed in order to generate appropriate current for resistance and/or UE movements. Mitten hands for side stepping. Wall  exercises to inc: Hip 3 ways 20x ea with min support of pool wall. VC to push/pull against water with a force appropriate. Bil heel raises 20x no UE support. High knee marching with neck pillow 8x. Core compressions with neck pillow 5 sec hold 10x. Water UE weights for horizontal add/abd 2x 10, VC to maintain core throughout exercises. Underwater bicycle with large noodle behind pt for 10 min, no fatigue demonstrated. Hamstring stretching on second step using rails for balance, adding ankle PF/DF at end of hold.  Seated decompression with large noodle behind patient 3 min.                               PT Short Term Goals - 08/21/21 1020       PT SHORT TERM GOAL #1   Title Pt to be I with HEP    Time 6    Period Weeks    Status On-going    Target Date 06/05/21      PT SHORT TERM GOAL #2   Title pt to demonstrated improved pain with vaginal penetration to no more than 4/10 with size 2 dialator to equivalent sizing for progression to tolerate vaginal penetration.    Time 6    Period Weeks    Status Achieved    Target Date 06/05/21      PT  SHORT TERM GOAL #3   Title pt to demonstrate I ability to self correct and complete diaphragmatic breathing technique to promote improved pelvic relaxation    Time 6    Period Weeks    Status Achieved    Target Date 06/05/21               PT Long Term Goals - 08/21/21 1020       PT LONG TERM GOAL #1   Title pt to be I with advanced HEP    Time 4    Period Months    Status On-going      PT LONG TERM GOAL #2   Title pt to demonstrate improved bil hip strength to at least 5/5 globally for improved functional mobility and decreased compensatory strategies   4/5 in extension and abduction; 5/5 all else   Time 4    Period Months    Status On-going      PT LONG TERM GOAL #3   Title pt to report no more than 2/10 pain with size 4 vaginal dialator or size equivalent for progression to tolerate vaginal penetration    0/10   Time 4    Period Months    Status Achieved      PT LONG TERM GOAL #4   Title pt to report no more than 2/10 pain with size 6 vaginal dialator or size equivalent for progression to tolerate vaginal penetration and intercourse    Time 3    Period Months    Status New    Target Date 11/21/21      PT LONG TERM GOAL #5   Title pt to demonstrate ability to contract pelvic floor at 4/5 stretch consistently and isometric holds for 7s and full relaxation between reps to improve pelvic stability and proper mobility    Time 3    Period Months    Status New    Target Date 11/21/21                   Plan - 11/20/21 1438     Personal Factors and Comorbidities Time since onset of injury/illness/exacerbation;Fitness    Examination-Activity Limitations Other    Examination-Participation Restrictions Interpersonal Relationship    Stability/Clinical Decision Making Evolving/Moderate complexity    Rehab Potential Good    PT Frequency 1x / week    PT Duration 12 weeks    PT Treatment/Interventions ADLs/Self Care Home Management;Functional mobility training;Therapeutic activities;Therapeutic exercise;Manual techniques;Passive range of motion;Dry needling;Taping;Patient/family education;Neuromuscular re-education;Energy conservation;Aquatic Therapy    PT Home Exercise Plan Access Code [email or web address]    Consulted and Agree with Plan of Care Patient             Patient will benefit from skilled therapeutic intervention in order to improve the following deficits and impairments:  Decreased coordination, Decreased range of motion, Decreased endurance, Decreased strength, Decreased mobility, Postural dysfunction, Improper body mechanics, Impaired flexibility, Pain  Visit Diagnosis: Lack of coordination  Muscle weakness (generalized)  Cramp and spasm  Other abnormalities of gait and mobility  Abnormal posture  Unspecified lack of coordination     Problem List Patient Active  Problem List   Diagnosis Date Noted   Mitral valve prolapse 09/11/2021   Chest pain 06/22/2021   Anxiety with depression 06/22/2021   Chronic nonintractable headache 06/22/2021   Abnormal Pap smear of cervix 09/08/2020   Nexplanon in place 08/28/2020   Major depressive disorder, recurrent, severe without psychotic features ([facility]) 07/01/2018   Generalized  anxiety disorder 07/01/2018   Moderate episode of recurrent major depressive disorder ([facility]) 04/11/2018   Major depression, recurrent ([facility]) 01/07/2014   Autonomic dysreflexia 10/24/2012   Vasovagal near syncope 05/24/2012   Postural orthostatic tachycardia syndrome 08/27/2011    Keshawn Fiorito, PTA 11/20/2021, 4:09 PM  [facility] @ [facility] [address] [place], [place], [address] Phone: [phone]   Fax:  [phone]  Name: Victoria [name] MRN: [id number] Date of Birth: 04/21/1996

## 2021-11-24 ENCOUNTER — Ambulatory Visit (INDEPENDENT_AMBULATORY_CARE_PROVIDER_SITE_OTHER): Payer: 59 | Admitting: Internal Medicine

## 2021-11-24 ENCOUNTER — Encounter: Payer: Self-pay | Admitting: Internal Medicine

## 2021-11-24 ENCOUNTER — Other Ambulatory Visit: Payer: Self-pay

## 2021-11-24 DIAGNOSIS — R202 Paresthesia of skin: Secondary | ICD-10-CM | POA: Diagnosis not present

## 2021-11-24 DIAGNOSIS — R200 Anesthesia of skin: Secondary | ICD-10-CM

## 2021-11-24 DIAGNOSIS — R251 Tremor, unspecified: Secondary | ICD-10-CM | POA: Insufficient documentation

## 2021-11-24 NOTE — Progress Notes (Signed)
Virtual Visit via Telephone Note   This visit type was conducted due to national recommendations for restrictions regarding the COVID-19 Pandemic (e.g. social distancing) in an effort to limit this patient's exposure and mitigate transmission in our community.  Due to her co-morbid illnesses, this patient is at least at moderate risk for complications without adequate follow up.  This format is felt to be most appropriate for this patient at this time.  The patient did not have access to video technology/had technical difficulties with video requiring transitioning to audio format only (telephone).  All issues noted in this document were discussed and addressed.  No physical exam could be performed with this format.   Evaluation Performed:  Follow-up visit  Date:  11/24/2021   ID:  Victoria [name], DOB 1996/07/09, MRN [id number]  Patient Location: Home Provider Location: Office/Clinic  Participants: Patient Location of Patient: Home Location of Provider: Telehealth Consent was obtain for visit to be over via telehealth. I verified that I am speaking with the correct person using two identifiers.  PCP:  Anabel Halon, MD   Chief Complaint: Numbness and weakness of the hands  History of Present Illness:    Victoria [name] is a 26 y.o. female who has televisit for complaint of numbness and weakness of the hands for the last few months.  She denies any recent injury.  She also reports having tremors, both at resting position and while movement.  She reports losing objects from her hands at times due to weakness. She denies any chest pain currently.  She used to work on [employer] for long times in her previous job.  The patient does not have symptoms concerning for COVID-19 infection (fever, chills, cough, or new shortness of breath).   Past Medical, Surgical, Social History, Allergies, and Medications have been Reviewed.  Past Medical History:  Diagnosis Date   Abnormal  Papanicolaou smear of cervix with positive human papilloma virus (HPV) test 09/08/2020   Pap was LSIL +HPV:  Needs colpo per ASCCP guidelines,  CIN 3+ immediate risk is 4.3%______________   Bipolar disorder ([facility])    Mitral valve prolapse    2D echo 09/2021 showed mild anterior mitral valve prolapse with mild MR   POTS (postural orthostatic tachycardia syndrome)    Past Surgical History:  Procedure Laterality Date   EXTRACORPOREAL CIRCULATION     LASER ABLATION CONDOLAMATA N/A 10/24/2020   Procedure: LASER ABLATION of the cervix;  Surgeon: Lazaro Arms, MD;  Location: [facility];  Service: Gynecology;  Laterality: N/A;     Current Meds  Medication Sig   etonogestrel (NEXPLANON) 68 MG IMPL implant 1 each by Subdermal route once.   fludrocortisone (FLORINEF) 0.1 MG tablet TAKE 1 TABLET BY MOUTH TWICE A DAY   fluticasone (FLONASE) 50 MCG/ACT nasal spray Place 1 spray into both nostrils daily.   hydrOXYzine (VISTARIL) 25 MG capsule Take 1 capsule (25 mg total) by mouth every 8 (eight) hours as needed.   ondansetron (ZOFRAN ODT) 8 MG disintegrating tablet Take 1 tablet (8 mg total) by mouth every 8 (eight) hours as needed for nausea or vomiting.   pantoprazole (PROTONIX) 20 MG tablet Take 1 tablet (20 mg total) by mouth daily.   [DISCONTINUED] predniSONE (STERAPRED UNI-PAK 21 TAB) 10 MG (21) TBPK tablet Take by mouth daily. Take 6 tabs by mouth daily  for 2 days, then 5 tabs for 2 days, then 4 tabs for 2 days, then 3 tabs for 2 days,  2 tabs for 2 days, then 1 tab by mouth daily for 2 days     Allergies:   Patient has no known allergies.   ROS:   Please see the history of present illness.     All other systems reviewed and are negative.   Labs/Other Tests and Data Reviewed:    Recent Labs: 10/28/2021: ALT 14; BUN 9; Creatinine, Ser 0.80; Hemoglobin 13.9; Platelets 274; Potassium 3.6; Sodium 135   Recent Lipid Panel No results found for: CHOL, TRIG, HDL, CHOLHDL, LDLCALC, LDLDIRECT  Wt  Readings from Last 3 Encounters:  10/28/21 176 lb (79.8 kg)  09/21/21 173 lb 0.6 oz (78.5 kg)  07/30/21 174 lb (78.9 kg)     ASSESSMENT & PLAN:    Numbness and tingling of hand Unclear etiology, could be carpal tunnel syndrome and/or peripheral neuropathy Advised to wear wrist brace for now Trial of pyridoxine Referred to neurology as she has weakness of the hand and tremors as well  Tremors of nervous system Combined resting and with movement Referred to Neurology   Time:   Today, I have spent 11 minutes reviewing the chart, including problem list, medications, and with the patient with telehealth technology discussing the above problems.   Medication Adjustments/Labs and Tests Ordered: Current medicines are reviewed at length with the patient today.  Concerns regarding medicines are outlined above.   Tests Ordered: Orders Placed This Encounter  Procedures   Ambulatory referral to Neurology    Medication Changes: No orders of the defined types were placed in this encounter.    Note: This dictation was prepared with Dragon dictation along with smaller phrase technology. Similar sounding words can be transcribed inadequately or may not be corrected upon review. Any transcriptional errors that result from this process are unintentional.      Disposition:  Follow up  Signed, Anabel Halon, MD  11/24/2021 10:58 AM     [facility]

## 2021-11-24 NOTE — Assessment & Plan Note (Signed)
Unclear etiology, could be carpal tunnel syndrome and/or peripheral neuropathy Advised to wear wrist brace for now Trial of pyridoxine Referred to neurology as she has weakness of the hand and tremors as well

## 2021-11-24 NOTE — Assessment & Plan Note (Signed)
Combined resting and with movement Referred to Neurology

## 2021-11-24 NOTE — Patient Instructions (Signed)
Please wear wrist brace at nighttime.  Please take Pyridoxine 200 mg once daily for 3 months.  You are being referred to Neurology.

## 2021-11-26 ENCOUNTER — Ambulatory Visit (INDEPENDENT_AMBULATORY_CARE_PROVIDER_SITE_OTHER): Payer: 59 | Admitting: Psychology

## 2021-11-26 DIAGNOSIS — F319 Bipolar disorder, unspecified: Secondary | ICD-10-CM

## 2021-11-26 DIAGNOSIS — R69 Illness, unspecified: Secondary | ICD-10-CM | POA: Diagnosis not present

## 2021-11-26 NOTE — Progress Notes (Signed)
[facility] Counselor/Therapist Progress Note ° °Patient ID: Victoria [name], MRN: [id number],   ° °Date: 11/26/2021 ° °Time Spent: 11:01am-11:47am  ° °Treatment Type: Individual Therapy  pt is seen for a virtual video visit via webex.  Pt joins from her home and counselor from her home office.  ° °Reported Symptoms: stress from medical issues, some worry °Mental Status Exam: °Appearance:  Well Groomed     °Behavior: Appropriate  °Motor: Normal  °Speech/Language:  Normal Rate  °Affect: Appropriate  °Mood: normal  °Thought process: normal  °Thought content:   WNL  °Sensory/Perceptual disturbances:   WNL  °Orientation: oriented to person, place, time/date, and situation  °Attention: Good  °Concentration: Good  °Memory: WNL  °Fund of knowledge:  Good  °Insight:   Good  °Judgment:  Good  °Impulse Control: Good  ° °Risk Assessment: °Danger to Self:  No °Self-injurious Behavior: No °Danger to Others: No °Duty to Warn:no °Physical Aggression / Violence:No  °Access to Firearms a concern: No  °Gang Involvement:No  ° °Subjective: Counselor assessed pt current functioning per pt report.  Processed w/ pt feeling worry and frustrations.  Assisted pt in recognizing steps she has taken advocating for self and positive of getting referral.  Discussed process w/ finding job and discussed frustrations w/ family interactions.  Discussed setting boundaries and asserting.  Pt affect congruent wnl.  Pt reported that she has continued to have headaches, but no debilitating in past week.  Pt reported that still feeling really worn, weak daily.  Pt sought referral and advocating w/her PCP for referral and scheduled w/ neurologist for end of march.  Pt expressed frustration as felt that PCP was dismissive again.  Pt reported that some frustration w/ looking for job and taking longer than anticipated.  Pt reports that she also frustration w/ family expecting she will help w/things as "not busy". Pt reported that she is setting  boundaries and no longer feels bad about and discussed ways to further assert. ° °Interventions: Cognitive Behavioral Therapy, Assertiveness/Communication, and positive self talk.  ° °Diagnosis:Bipolar I disorder (HCC) ° °Plan: f/u w/ pt in 2 weeks for counseling to assist coping w/ stress, anxiety and mood stability.  See tx plan on file in Therapy Charts. Pt to f/u w/ referral to neurology and pt reports exploring new PCP.  ° °Treatment Plan 01/08/21 °Client Abilities/Strengths  °supports: my maternal uncle and his friend Ashley and my best friend- who I met in college. pt enjoys her dogs, reading and singing  °Client Treatment Preferences  °biweekly counseling to start. pt is being referred to psychiatrist by her PCP to manage meds.  °Client Statement of Needs  °pt: "i feel like i should process some things and deal w/ some things to help me take care of myself and know what I want" be able to cope and deal w/ the things that have hit me hard recent in a positive way."  °Treatment Level  °outpatient counseling  °Symptoms  °Autonomic hyperactivity (e.g., palpitations, shortness of breath, dry mouth, trouble swallowing, nausea, diarrhea).: No Description Entered (Status: maintained). Decrease or loss of appetite.: No Description Entered (Status: maintained). Depressed or irritable mood.: No Description Entered (Status: maintained). History of at least one hypomanic, manic, or mixed mood episode.: No Description Entered (Status: maintained). History of chronic or recurrent depression for which the client has taken antidepressant medication, been hospitalized, had outpatient treatment, or had a course of electroconvulsive therapy.: No Description Entered (Status: maintained). Hypervigilance (e.g., feeling constantly on   edge, experiencing concentration difficulties, having trouble falling or staying asleep, exhibiting a general state of irritability).: No Description Entered (Status: maintained). Lack of energy.: No  Description Entered (Status: maintained). Motor tension (e.g., restlessness, tiredness, shakiness, muscle tension).: No Description Entered (Status: maintained). Sleeplessness or hypersomnia.: No Description Entered (Status: maintained).  °Problems Addressed  °Bipolar Disorder - Depression, Bipolar Disorder - Depression, Anxiety  °Goals °1. Alleviate depressive symptoms and return to previous level of effective functioning. °2. Develop healthy cognitive patterns and beliefs about self and the world that lead to alleviation and help prevent the relapse of mood episodes. °Objective °Discuss and resolve troubling personal and interpersonal issues. °Target Date: 2022-01-08 Frequency: Daily  °Progress: 0 Modality: individual  °Related Interventions °Use interpersonal therapy techniques to explore and resolve issues surrounding grief, role disputes, role transitions, and social skills deficits; provide support and strategies for resolving identified interpersonal issues. °Objective °Identify and replace thoughts and behaviors that trigger manic or depressive symptoms. °Target Date: 2022-01-08 Frequency: Daily  °Progress: 0 Modality: individual  °Related Interventions °Use cognitive therapy techniques to explore and educate the client about cognitive biases that trigger his/her elevated or depressive mood (see Cognitive Therapy for Bipolar Disorder by Lam et al.). °3. Enhance ability to effectively cope with the full variety of life's worries and anxieties. °Objective °Learn and implement problem-solving strategies for realistically addressing worries. °Target Date: 2022-01-08 Frequency: Daily  °Progress: 0 Modality: individual  °Related Interventions °Teach the client problem-solving strategies involving specifically defining a problem, generating options for addressing it, evaluating the pros and cons of each option, selecting and implementing an optional action, and reevaluating and refining the action (or assign  "Applying Problem-Solving to Interpersonal Conflict" in the Adult Psychotherapy Homework Planner by Jongsma). °Objective °Identify, challenge, and replace biased, fearful self-talk with positive, realistic, and empowering self-talk. °Target Date: 2022-01-08 Frequency: Daily  °Progress: 0 Modality: individual  °Related Interventions °Explore the client's schema and self-talk that mediate his/her fear response; assist him/her in challenging the biases; replace the distorted messages with reality-based alternatives and positive, realistic self-talk that will increase his/her self-confidence in coping with irrational fears (see Cognitive Therapy of Anxiety Disorders by Clark and Beck). °Objective °Learn and implement calming skills to reduce overall anxiety and manage anxiety symptoms. °Target Date: 2022-01-08 Frequency: Daily  °Progress: 0 Modality: individual  °Related Interventions °Teach the client calming/relaxation skills (e.g., applied relaxation, progressive muscle relaxation, cue controlled relaxation; mindful breathing; biofeedback) and how to discriminate better between relaxation and tension; teach the client how to apply these skills to his/her daily life (e.g., New Directions in Progressive Muscle Relaxation by Bernstein, Borkovec, and Hazlett-Stevens; Treating Generalized Anxiety Disorder by Rygh and Sanderson). °Pt participated in development of plan and provided verbal consent.  ° ° ° ° °YATES,LEANNE, LCMHC °

## 2021-11-27 ENCOUNTER — Ambulatory Visit: Payer: 59 | Admitting: Physical Therapy

## 2021-12-04 ENCOUNTER — Ambulatory Visit: Payer: Self-pay | Admitting: Physical Therapy

## 2021-12-10 ENCOUNTER — Ambulatory Visit (INDEPENDENT_AMBULATORY_CARE_PROVIDER_SITE_OTHER): Payer: 59 | Admitting: Psychology

## 2021-12-10 DIAGNOSIS — F319 Bipolar disorder, unspecified: Secondary | ICD-10-CM | POA: Diagnosis not present

## 2021-12-10 DIAGNOSIS — R69 Illness, unspecified: Secondary | ICD-10-CM | POA: Diagnosis not present

## 2021-12-10 NOTE — Progress Notes (Signed)
[facility] Counselor/Therapist Progress Note ? ?Patient ID: Victoria [name], MRN: [id number],   ? ?Date: 12/10/2021 ? ?Time Spent: 09:23RA-0:76AU  ? ?Treatment Type: Individual Therapy  pt is seen for a virtual video visit via webex.  Pt joins from her home and counselor from her home office.  ? ?Reported Symptoms: stress from medical issues, feeling down ?Mental Status Exam: ?Appearance:  Well Groomed     ?Behavior: Appropriate  ?Motor: Normal  ?Speech/Language:  Normal Rate  ?Affect: Appropriate  ?Mood: dysthymic  ?Thought process: normal  ?Thought content:   WNL  ?Sensory/Perceptual disturbances:   WNL  ?Orientation: oriented to person, place, time/date, and situation  ?Attention: Good  ?Concentration: Good  ?Memory: WNL  ?Fund of knowledge:  Good  ?Insight:   Good  ?Judgment:  Good  ?Impulse Control: Good  ? ?Risk Assessment: ?Danger to Self:  No ?Self-injurious Behavior: No ?Danger to Others: No ?Duty to Warn:no ?Physical Aggression / Violence:No  ?Access to Firearms a concern: No  ?Gang Involvement:No  ? ?Subjective: Counselor assessed pt current functioning per pt report.  Processed w/ pt mood and stressors. 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  Reflected pt focus on what is in her control and steps she is taking and realistic about process/options.  Pt affect wnl.  Pt reported that she has had 2 days of bad headaches and debilitating. Pt reported that feeling down and discouraged as no job opportunities that match what looking for and also feels that slow process for exploring medical concerns and answers.  Pt discussed steps she is taking and options she can take if needed for income while working towards preferred.   ? ?Interventions: Cognitive Behavioral Therapy, Assertiveness/Communication, and positive self talk.  ? ?Diagnosis:Bipolar I disorder ([place]) ? ?Plan: f/u w/ pt in 2 weeks for counseling to assist coping w/ stress, anxiety and mood stability.   See tx plan on file in Therapy Charts. Pt to f/u w/ referral to neurology and pt reports exploring new PCP.  ? ?Treatment Plan 01/08/21 ?Client Abilities/Strengths  ?supports: my maternal uncle and his friend [name] and my best friend- who I met in college. pt enjoys her dogs, reading and singing  ?Client Treatment Preferences  ?biweekly counseling to start. pt is being referred to psychiatrist by her PCP to manage meds.  ?Client Statement of Needs  ?pt: "i feel like i should process some things and deal w/ some things to help me take care of myself and know what I want" be able to cope and deal w/ the things that have hit me hard recent in a positive way."  ?Treatment Level  ?outpatient counseling  ?Symptoms  ?Autonomic hyperactivity (e.g., palpitations, shortness of breath, dry mouth, trouble swallowing, nausea, diarrhea).: No Description Entered (Status: maintained). Decrease or loss of appetite.: No Description Entered (Status: maintained). Depressed or irritable mood.: No Description Entered (Status: maintained). History of at least one hypomanic, manic, or mixed mood episode.: No Description Entered (Status: maintained). History of chronic or recurrent depression for which the client has taken antidepressant medication, been hospitalized, had outpatient treatment, or had a course of electroconvulsive therapy.: No Description Entered (Status: maintained). Hypervigilance (e.g., feeling constantly on edge, experiencing concentration difficulties, having trouble falling or staying asleep, exhibiting a general state of irritability).: No Description Entered (Status: maintained). Lack of energy.: No Description Entered (Status: maintained). Motor tension (e.g., restlessness, tiredness, shakiness, muscle tension).: No Description Entered (Status: maintained). Sleeplessness or hypersomnia.: No Description Entered (Status: maintained).  ?  Problems Addressed  ?Bipolar Disorder - Depression, Bipolar Disorder - Depression,  Anxiety  ?Goals ?1. Alleviate depressive symptoms and return to previous level of effective functioning. ?2. Develop healthy cognitive patterns and beliefs about self and the world that lead to alleviation and help prevent the relapse of mood episodes. ?Objective ?Discuss and resolve troubling personal and interpersonal issues. ?Target Date: 2022-01-08 Frequency: Daily  ?Progress: 0 Modality: individual  ?Related Interventions ?Use interpersonal therapy techniques to explore and resolve issues surrounding grief, role disputes, role transitions, and social skills deficits; provide support and strategies for resolving identified interpersonal issues. ?Objective ?Identify and replace thoughts and behaviors that trigger manic or depressive symptoms. ?Target Date: 2022-01-08 Frequency: Daily  ?Progress: 0 Modality: individual  ?Related Interventions ?Use cognitive therapy techniques to explore and educate the client about cognitive biases that trigger his/her elevated or depressive mood (see Cognitive Therapy for Bipolar Disorder by Lanny Hurst al.). ?3. Enhance ability to effectively cope with the full variety of life's worries and anxieties. ?Objective ?Learn and implement problem-solving strategies for realistically addressing worries. ?Target Date: 2022-01-08 Frequency: Daily  ?Progress: 0 Modality: individual  ?Related Interventions ?Teach the client problem-solving strategies involving specifically defining a problem, generating options for addressing it, evaluating the pros and cons of each option, selecting and implementing an optional action, and reevaluating and refining the action (or assign "Applying Problem-Solving to Interpersonal Conflict" in the Adult Psychotherapy Homework Planner by Bryn Gulling). ?Objective ?Identify, challenge, and replace biased, fearful self-talk with positive, realistic, and empowering self-talk. ?Target Date: 2022-01-08 Frequency: Daily  ?Progress: 0 Modality: individual  ?Related  Interventions ?Explore the client's schema and self-talk that mediate his/her fear response; assist him/her in challenging the biases; replace the distorted messages with reality-based alternatives and positive, realistic self-talk that will increase his/her self-confidence in coping with irrational fears (see Cognitive Therapy of Anxiety Disorders by Alison Stalling). ?Objective ?Learn and implement calming skills to reduce overall anxiety and manage anxiety symptoms. ?Target Date: 2022-01-08 Frequency: Daily  ?Progress: 0 Modality: individual  ?Related Interventions ?Teach the client calming/relaxation skills (e.g., applied relaxation, progressive muscle relaxation, cue controlled relaxation; mindful breathing; biofeedback) and how to discriminate better between relaxation and tension; teach the client how to apply these skills to his/her daily life (e.g., New Directions in Progressive Muscle Relaxation by Casper Harrison, and Hazlett-Stevens; Treating Generalized Anxiety Disorder by Rygh and Amparo Bristol). ?Pt participated in development of plan and provided verbal consent.  ? ? ? ? ?Jan Fireman, [facility] ? ? ? ? ? ? ? ? ? ? ? ? ? ? Jan Fireman, [facility] ?

## 2021-12-24 ENCOUNTER — Ambulatory Visit: Payer: 59 | Admitting: Psychology

## 2021-12-29 ENCOUNTER — Ambulatory Visit: Payer: 59 | Admitting: Psychology

## 2022-01-05 ENCOUNTER — Ambulatory Visit (INDEPENDENT_AMBULATORY_CARE_PROVIDER_SITE_OTHER): Payer: 59 | Admitting: Psychology

## 2022-01-05 DIAGNOSIS — F319 Bipolar disorder, unspecified: Secondary | ICD-10-CM | POA: Diagnosis not present

## 2022-01-05 DIAGNOSIS — R69 Illness, unspecified: Secondary | ICD-10-CM | POA: Diagnosis not present

## 2022-01-05 NOTE — Progress Notes (Signed)
[place] Counselor/Therapist Progress Note ? ?Patient ID: ARIEL DIMITRI, MRN: [id number],   ? ?Date: 01/05/2022 ? ?Time Spent: 10:02am-10:56am  ? ?Treatment Type: Individual Therapy  pt is seen for a virtual video visit via webex.  Pt joins from her home and counselor from her home office.  ? ?Reported Symptoms: increased depressed moods, sleep disturbance ?Mental Status Exam: ?Appearance:  Well Groomed     ?Behavior: Appropriate  ?Motor: Normal  ?Speech/Language:  Normal Rate  ?Affect: Appropriate  ?Mood: depressed  ?Thought process: normal  ?Thought content:   WNL  ?Sensory/Perceptual disturbances:   WNL  ?Orientation: oriented to person, place, time/date, and situation  ?Attention: Good  ?Concentration: Good  ?Memory: WNL  ?Fund of knowledge:  Good  ?Insight:   Good  ?Judgment:  Good  ?Impulse Control: Good  ? ?Risk Assessment: ?Danger to Self:  No ?Self-injurious Behavior: No ?Danger to Others: No ?Duty to Warn:no ?Physical Aggression / Violence:No  ?Access to Firearms a concern: No  ?Gang Involvement:No  ? ?Subjective: Counselor assessed pt current functioning per pt report.  Processed w/ pt mood and identifying contributing factors to shift in mood. Explored protective factors for mood and self care, routine.  Discussed stressors w/ some conflict and avoidance.  Pt affect congruent w/ report of depressed mood.  Pt reported that she has been feeling more shift in mood being down, sleep disturbance, increased difficulty falling asleep and want to not get out of bed.  Pt acknowledged that routine and schedule are important and has set a schedule for self and that is helping.  Pt acknowledged that when things more stressful, in transition or unsettled in her life mood impacted.  Pt discussed impact of allowing stepbrother to stay w/ her and impact had that hadn't anticipated.  Pt discussed some conflict about not meeting expectations- financial and cleaning up after self.  Pt reported needs to  f/u w/ mom who said would supplement financially and hasn't.  Pt discussed how reframing to encourage self instead of avoid stressors.  ? ?Interventions: Cognitive Behavioral Therapy, Assertiveness/Communication, and positive self talk.  ? ?Diagnosis:Bipolar I disorder ([place]) ? ?Plan: f/u w/ pt in 1 weeks for counseling to assist coping w/ stress, anxiety and mood stability.  See tx plan on file in Therapy Charts. Pt to f/u w/ referral to neurology and pt reports exploring new PCP.  ? ?Treatment Plan 01/08/21 ?Client Abilities/Strengths  ?supports: my maternal uncle and his friend [name] and my best friend- who I met in college. pt enjoys her dogs, reading and singing  ?Client Treatment Preferences  ?biweekly counseling to start. pt is being referred to psychiatrist by her PCP to manage meds.  ?Client Statement of Needs  ?pt: "i feel like i should process some things and deal w/ some things to help me take care of myself and know what I want" be able to cope and deal w/ the things that have hit me hard recent in a positive way."  ?Treatment Level  ?outpatient counseling  ?Symptoms  ?Autonomic hyperactivity (e.g., palpitations, shortness of breath, dry mouth, trouble swallowing, nausea, diarrhea).: No Description Entered (Status: maintained). Decrease or loss of appetite.: No Description Entered (Status: maintained). Depressed or irritable mood.: No Description Entered (Status: maintained). History of at least one hypomanic, manic, or mixed mood episode.: No Description Entered (Status: maintained). History of chronic or recurrent depression for which the client has taken antidepressant medication, been hospitalized, had outpatient treatment, or had a course of electroconvulsive therapy.: No  Description Entered (Status: maintained). Hypervigilance (e.g., feeling constantly on edge, experiencing concentration difficulties, having trouble falling or staying asleep, exhibiting a general state of irritability).: No  Description Entered (Status: maintained). Lack of energy.: No Description Entered (Status: maintained). Motor tension (e.g., restlessness, tiredness, shakiness, muscle tension).: No Description Entered (Status: maintained). Sleeplessness or hypersomnia.: No Description Entered (Status: maintained).  ?Problems Addressed  ?Bipolar Disorder - Depression, Bipolar Disorder - Depression, Anxiety  ?Goals ?1. Alleviate depressive symptoms and return to previous level of effective functioning. ?2. Develop healthy cognitive patterns and beliefs about self and the world that lead to alleviation and help prevent the relapse of mood episodes. ?Objective ?Discuss and resolve troubling personal and interpersonal issues. ?Target Date: 2022-01-08 Frequency: Daily  ?Progress: 0 Modality: individual  ?Related Interventions ?Use interpersonal therapy techniques to explore and resolve issues surrounding grief, role disputes, role transitions, and social skills deficits; provide support and strategies for resolving identified interpersonal issues. ?Objective ?Identify and replace thoughts and behaviors that trigger manic or depressive symptoms. ?Target Date: 2022-01-08 Frequency: Daily  ?Progress: 0 Modality: individual  ?Related Interventions ?Use cognitive therapy techniques to explore and educate the client about cognitive biases that trigger his/her elevated or depressive mood (see Cognitive Therapy for Bipolar Disorder by Lanny Hurst al.). ?3. Enhance ability to effectively cope with the full variety of life's worries and anxieties. ?Objective ?Learn and implement problem-solving strategies for realistically addressing worries. ?Target Date: 2022-01-08 Frequency: Daily  ?Progress: 0 Modality: individual  ?Related Interventions ?Teach the client problem-solving strategies involving specifically defining a problem, generating options for addressing it, evaluating the pros and cons of each option, selecting and implementing an optional  action, and reevaluating and refining the action (or assign "Applying Problem-Solving to Interpersonal Conflict" in the Adult Psychotherapy Homework Planner by Bryn Gulling). ?Objective ?Identify, challenge, and replace biased, fearful self-talk with positive, realistic, and empowering self-talk. ?Target Date: 2022-01-08 Frequency: Daily  ?Progress: 0 Modality: individual  ?Related Interventions ?Explore the client's schema and self-talk that mediate his/her fear response; assist him/her in challenging the biases; replace the distorted messages with reality-based alternatives and positive, realistic self-talk that will increase his/her self-confidence in coping with irrational fears (see Cognitive Therapy of Anxiety Disorders by Alison Stalling). ?Objective ?Learn and implement calming skills to reduce overall anxiety and manage anxiety symptoms. ?Target Date: 2022-01-08 Frequency: Daily  ?Progress: 0 Modality: individual  ?Related Interventions ?Teach the client calming/relaxation skills (e.g., applied relaxation, progressive muscle relaxation, cue controlled relaxation; mindful breathing; biofeedback) and how to discriminate better between relaxation and tension; teach the client how to apply these skills to his/her daily life (e.g., New Directions in Progressive Muscle Relaxation by Casper Harrison, and Hazlett-Stevens; Treating Generalized Anxiety Disorder by Rygh and Amparo Bristol). ?Pt participated in development of plan and provided verbal consent.  ? ? ? ? ? Jan Fireman, [facility] ?

## 2022-01-07 ENCOUNTER — Ambulatory Visit: Payer: 59 | Admitting: Psychology

## 2022-01-08 ENCOUNTER — Ambulatory Visit (INDEPENDENT_AMBULATORY_CARE_PROVIDER_SITE_OTHER): Payer: 59 | Admitting: Diagnostic Neuroimaging

## 2022-01-08 ENCOUNTER — Encounter: Payer: Self-pay | Admitting: Diagnostic Neuroimaging

## 2022-01-08 VITALS — BP 114/76 | HR 101 | Ht 64.0 in | Wt 171.6 lb

## 2022-01-08 DIAGNOSIS — R202 Paresthesia of skin: Secondary | ICD-10-CM | POA: Diagnosis not present

## 2022-01-08 DIAGNOSIS — R4189 Other symptoms and signs involving cognitive functions and awareness: Secondary | ICD-10-CM | POA: Diagnosis not present

## 2022-01-08 DIAGNOSIS — R200 Anesthesia of skin: Secondary | ICD-10-CM

## 2022-01-08 NOTE — Progress Notes (Signed)
? ?[facility] ? ?PATIENT: Victoria [name] ?DOB: 06/13/1996 ? ?REFERRING CLINICIAN: Anabel HalonPatel, Rutwik K, MD ?HISTORY FROM: patient  ?REASON FOR VISIT: new consult  ? ? ?HISTORICAL ? ?CHIEF COMPLAINT:  ?Chief Complaint  ?Patient presents with  ? New Patient (Initial Visit)  ?  Rm 6, alone. Pt referred for numbness/tingling, weakness, and tremors of hands. Pt has been having new sx last couple months. Has been having ha and migraines, ha are every week and migrate side to side and pn behind eyes. Migraines 1-2x a month w vision problems. Has had muscles weakness in legs and caused her to fall at times.   ? ? ?HISTORY OF PRESENT ILLNESS:  ? ?[age] year old female here for evaluation of headaches, brain fog, tremor, swallowing difficulties, numbness and tingling. ? ?Starting at age 26 year old patient was having lightheaded, dizzy, fatigue, decreased appetite symptoms.  At age 26 years old she was diagnosed with POTS and started on Florinef. ? ?For past 7 to 8 months patient has had increasing and different symptoms.  Now having intermittent headaches 1-2 times per week.  Also having some migraine type headaches with seeing colors, kaleidoscope, right or left-sided headaches, sensitive to light and sound and nausea. ? ?Also having issues with brain fog, tremor, swallowing difficulties, handwriting difficulties. ? ?Has some history of depression anxiety, currently seeing counselor. ? ? ? ?REVIEW OF SYSTEMS: Full 14 system review of systems performed and negative with exception of: as per HPI. ? ?ALLERGIES: ?No Known Allergies ? ?HOME MEDICATIONS: ?Outpatient Medications Prior to Visit  ?Medication Sig Dispense Refill  ? etonogestrel (NEXPLANON) 68 MG IMPL implant 1 each by Subdermal route once.    ? fludrocortisone (FLORINEF) 0.1 MG tablet TAKE 1 TABLET BY MOUTH TWICE A DAY 60 tablet 1  ? fluticasone (FLONASE) 50 MCG/ACT nasal spray Place 1 spray into both nostrils daily. 16 g 2  ? hydrOXYzine (VISTARIL) 25  MG capsule Take 1 capsule (25 mg total) by mouth every 8 (eight) hours as needed. 30 capsule 2  ? ondansetron (ZOFRAN ODT) 8 MG disintegrating tablet Take 1 tablet (8 mg total) by mouth every 8 (eight) hours as needed for nausea or vomiting. 8 tablet 0  ? pantoprazole (PROTONIX) 20 MG tablet Take 1 tablet (20 mg total) by mouth daily. 30 tablet 1  ? ?No facility-administered medications prior to visit.  ? ? ?PAST MEDICAL HISTORY: ?Past Medical History:  ?Diagnosis Date  ? Abnormal Papanicolaou smear of cervix with positive human papilloma virus (HPV) test 09/08/2020  ? Pap was LSIL +HPV:  Needs colpo per ASCCP guidelines,  CIN 3+ immediate risk is 4.3%______________  ? Bipolar disorder (HCC)   ? Mitral valve prolapse   ? 2D echo 09/2021 showed mild anterior mitral valve prolapse with mild MR  ? POTS (postural orthostatic tachycardia syndrome)   ? ? ?PAST SURGICAL HISTORY: ?Past Surgical History:  ?Procedure Laterality Date  ? EXTRACORPOREAL CIRCULATION    ? LASER ABLATION CONDOLAMATA N/A 10/24/2020  ? Procedure: LASER ABLATION of the cervix;  Surgeon: Lazaro ArmsEure, Luther H, MD;  Location: [facility];  Service: Gynecology;  Laterality: N/A;  ? ? ?FAMILY HISTORY: ?Family History  ?Problem Relation Age of Onset  ? Diabetes Paternal Grandfather   ? Colon cancer Maternal Grandfather   ? ? ?SOCIAL HISTORY: ?Social History  ? ?Socioeconomic History  ? Marital status: Legally Separated  ?  Spouse name: Not on file  ? Number of children: Not on file  ? Years of education: Not on  file  ? Highest education level: Some college, no degree  ?Occupational History  ? Not on file  ?Tobacco Use  ? Smoking status: Never  ? Smokeless tobacco: Never  ?Vaping Use  ? Vaping Use: Former  ?Substance and Sexual Activity  ? Alcohol use: Yes  ?  Comment: occ  ? Drug use: No  ? Sexual activity: Not Currently  ?  Birth control/protection: Implant  ?Other Topics Concern  ? Not on file  ?Social History Narrative  ? Lives with younger brother (age 28)  ? Right  handed  ? Caffeine: 1 C of coffee in the AM. Cut off soda a few months ago.   ? ?Social Determinants of Health  ? ?Financial Resource Strain: Not on file  ?Food Insecurity: Not on file  ?Transportation Needs: Not on file  ?Physical Activity: Not on file  ?Stress: Not on file  ?Social Connections: Not on file  ?Intimate Partner Violence: Not on file  ? ? ? ?PHYSICAL EXAM ? ?GENERAL EXAM/CONSTITUTIONAL: ?Vitals:  ?Vitals:  ? 01/08/22 1043  ?BP: 114/76  ?Pulse: (!) 101  ?Weight: 171 lb 9.6 oz (77.8 kg)  ?Height: 5\' 4"  (1.626 m)  ? ?Body mass index is 29.46 kg/m?. ?Wt Readings from Last 3 Encounters:  ?01/08/22 171 lb 9.6 oz (77.8 kg)  ?10/28/21 176 lb (79.8 kg)  ?09/21/21 173 lb 0.6 oz (78.5 kg)  ? ?Patient is in no distress; well developed, nourished and groomed; neck is supple ? ?CARDIOVASCULAR: ?Examination of carotid arteries is normal; no carotid bruits ?Regular rate and rhythm, no murmurs ?Examination of peripheral vascular system by observation and palpation is normal ? ?EYES: ?Ophthalmoscopic exam of optic discs and posterior segments is normal; no papilledema or hemorrhages ?No results found. ? ?MUSCULOSKELETAL: ?Gait, strength, tone, movements noted in Neurologic exam below ? ?NEUROLOGIC: ?MENTAL STATUS:  ?   ? View : No data to display.  ?  ?  ?  ? ?awake, alert, oriented to person, place and time ?recent and remote memory intact ?normal attention and concentration ?language fluent, comprehension intact, naming intact ?fund of knowledge appropriate ? ?CRANIAL NERVE:  ?2nd - no papilledema on fundoscopic exam ?2nd, 3rd, 4th, 6th - pupils equal and reactive to light, visual fields full to confrontation, extraocular muscles intact, no nystagmus ?5th - facial sensation symmetric ?7th - facial strength symmetric ?8th - hearing intact ?9th - palate elevates symmetrically, uvula midline ?11th - shoulder shrug symmetric ?12th - tongue protrusion midline ? ?MOTOR:  ?normal bulk and tone, full strength in the BUE,  BLE ? ?SENSORY:  ?normal and symmetric to light touch, temperature, vibration ? ?COORDINATION:  ?finger-nose-finger, fine finger movements normal ? ?REFLEXES:  ?deep tendon reflexes TRACE and symmetric ? ?GAIT/STATION:  ?narrow based gait ? ? ? ? ?DIAGNOSTIC DATA (LABS, IMAGING, TESTING) ?- I reviewed patient records, labs, notes, testing and imaging myself where available. ? ?Lab Results  ?Component Value Date  ? WBC 6.4 10/28/2021  ? HGB 13.9 10/28/2021  ? HCT 40.8 10/28/2021  ? MCV 91.3 10/28/2021  ? PLT 274 10/28/2021  ? ?   ?Component Value Date/Time  ? NA 135 10/28/2021 1437  ? NA 140 08/28/2020 1504  ? K 3.6 10/28/2021 1437  ? CL 104 10/28/2021 1437  ? CO2 24 10/28/2021 1437  ? GLUCOSE 92 10/28/2021 1437  ? BUN 9 10/28/2021 1437  ? BUN 5 (L) 08/28/2020 1504  ? CREATININE 0.80 10/28/2021 1437  ? CALCIUM 9.0 10/28/2021 1437  ? PROT 6.8 10/28/2021  1437  ? PROT 7.3 08/28/2020 1504  ? ALBUMIN 3.9 10/28/2021 1437  ? ALBUMIN 4.7 08/28/2020 1504  ? AST 19 10/28/2021 1437  ? ALT 14 10/28/2021 1437  ? ALKPHOS 58 10/28/2021 1437  ? BILITOT 0.7 10/28/2021 1437  ? BILITOT 0.4 08/28/2020 1504  ? GFRNONAA >60 10/28/2021 1437  ? GFRAA 111 08/28/2020 1504  ? ?No results found for: CHOL, HDL, LDLCALC, LDLDIRECT, TRIG, CHOLHDL ?No results found for: HGBA1C ?No results found for: VITAMINB12 ?Lab Results  ?Component Value Date  ? TSH 0.949 08/28/2020  ? ? ?11/08/12 MRI brain  ?- No acute infarction or other acute finding.  The patient has some  ?symmetric signal in the deep white matter that is probably normal  ?or congenital.  The possibility that this relates to an early  ?manifestation of demyelinating disease/multiple sclerosis does  ?exist but is remote.   ? ? ? ?ASSESSMENT AND PLAN ? ?26 y.o. year old female here with: ? ? ?Dx: ? ?1. Numbness and tingling   ?2. Brain fog   ? ? ? ? ?PLAN: ? ?HEADACHES,  BRAIN FOG, TREMOR, SWALLOWING ISSUES ?- check MRI brain (rule out demyelinating dz) ?- optimize sleep, nutrition,  exercise ?- consider migraine treatments in future ? ?History of POTS ?- continue florinef; consider referral to autonomic disorder clinic ([facility], [facility]) ? ?Orders Placed This Encounter  ?Procedures  ? MR BRAIN W WO

## 2022-01-08 NOTE — Patient Instructions (Signed)
?  HEADACHES,  BRAIN FOG, TREMOR, SWALLOWING ISSUES ?- check MRI brain ?- optimize sleep, nutrition, exercise ?- consider migraine treatments in future ? ?History of POTS ?- continue florinef; consider referral to autonomic disorder clinic ([facility], [facility]) ?

## 2022-01-11 ENCOUNTER — Telehealth: Payer: Self-pay | Admitting: Diagnostic Neuroimaging

## 2022-01-11 NOTE — Telephone Encounter (Signed)
[employer] order sent to [facility], they will obtain the auth and reach out to the patient to schedule.  ?

## 2022-01-13 ENCOUNTER — Ambulatory Visit (INDEPENDENT_AMBULATORY_CARE_PROVIDER_SITE_OTHER): Payer: 59 | Admitting: Psychology

## 2022-01-13 DIAGNOSIS — R69 Illness, unspecified: Secondary | ICD-10-CM | POA: Diagnosis not present

## 2022-01-13 DIAGNOSIS — F319 Bipolar disorder, unspecified: Secondary | ICD-10-CM | POA: Diagnosis not present

## 2022-01-13 NOTE — Progress Notes (Signed)
? ? ? ? ? ? ? ? ? ? ? ? ? ? ?  Victoria [name], LCMHC ?
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
## 2022-01-16 ENCOUNTER — Other Ambulatory Visit: Payer: Self-pay | Admitting: Internal Medicine

## 2022-01-16 DIAGNOSIS — G90A Postural orthostatic tachycardia syndrome (POTS): Secondary | ICD-10-CM

## 2022-01-20 ENCOUNTER — Encounter: Payer: 59 | Admitting: Internal Medicine

## 2022-01-25 ENCOUNTER — Ambulatory Visit
Admission: RE | Admit: 2022-01-25 | Discharge: 2022-01-25 | Disposition: A | Discharge status: Home / Self Care | Payer: 59 | Source: Ambulatory Visit | Attending: Diagnostic Neuroimaging | Admitting: Diagnostic Neuroimaging

## 2022-01-25 DIAGNOSIS — R200 Anesthesia of skin: Secondary | ICD-10-CM | POA: Diagnosis not present

## 2022-01-25 DIAGNOSIS — R202 Paresthesia of skin: Secondary | ICD-10-CM | POA: Diagnosis not present

## 2022-01-25 DIAGNOSIS — R4189 Other symptoms and signs involving cognitive functions and awareness: Secondary | ICD-10-CM | POA: Diagnosis not present

## 2022-01-25 MED ORDER — GADOBENATE DIMEGLUMINE 529 MG/ML IV SOLN
15.0000 mL | Freq: Once | INTRAVENOUS | Status: AC | PRN
  Administered 2022-01-25: 15 mL via INTRAVENOUS

## 2022-01-28 ENCOUNTER — Ambulatory Visit (INDEPENDENT_AMBULATORY_CARE_PROVIDER_SITE_OTHER): Payer: 59 | Admitting: Psychology

## 2022-01-28 DIAGNOSIS — R69 Illness, unspecified: Secondary | ICD-10-CM | POA: Diagnosis not present

## 2022-01-28 DIAGNOSIS — F319 Bipolar disorder, unspecified: Secondary | ICD-10-CM
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
## 2022-02-03 ENCOUNTER — Ambulatory Visit (INDEPENDENT_AMBULATORY_CARE_PROVIDER_SITE_OTHER): Payer: 59 | Admitting: Psychology

## 2022-02-03 DIAGNOSIS — F319 Bipolar disorder, unspecified: Secondary | ICD-10-CM | POA: Diagnosis not present

## 2022-02-03 DIAGNOSIS — R69 Illness, unspecified: Secondary | ICD-10-CM | POA: Diagnosis not present

## 2022-02-03 NOTE — Progress Notes (Signed)
? ? ? ?  [facility] Counselor/Therapist Progress Note ? ?Patient ID: Victoria [name], MRN: [id number],   ? ?Date: 02/03/2022 ? ?Time Spent: 2:31pm-3:20pm  ? ?Treatment Type: Individual Therapy  pt is seen for a virtual video visit via webex.  Pt joins from her home and counselor from her home office.  ? ?Reported Symptoms: decreasing anxiety, depressed mood, fatigue, engaging in job search ?Mental Status Exam: ?Appearance:  Well Groomed     ?Behavior: Appropriate  ?Motor: Normal  ?Speech/Language:  Normal Rate  ?Affect: Appropriate  ?Mood: depressed  ?Thought process: normal  ?Thought content:   WNL  ?Sensory/Perceptual disturbances:   WNL  ?Orientation: oriented to person, place, time/date, and situation  ?Attention: Good  ?Concentration: Good  ?Memory: WNL  ?Fund of knowledge:  Good  ?Insight:   Good  ?Judgment:  Good  ?Impulse Control: Good  ? ?Risk Assessment: ?Danger to Self:  No ?Self-injurious Behavior: No ?Danger to Others: No ?Duty to Warn:no ?Physical Aggression / Violence:No  ?Access to Firearms a concern: No  ?Gang Involvement:No  ? ?Subjective: Counselor assessed pt current functioning per pt report.  Processed w/ mood and ways coping and engaging. Reflected decreasing anxiety and no further new stressor. Discussed pt exploring options and reiterated not making any impulsive decisions.  Discussed positive of exploring options and what are not barriers.  Pt affect wnl.  Pt reports continued depressed mood, fatigue and feeling stuck.  Pt reports less anxiety attacks this week.  Pt reported engaging more w/ job search this week and has made progress w/ scheduling new appointments w/ doctors.  Pt discussed how considered if wanting to move.  Pt recognized not tied to area or barriers besides financial but also recognized that might be hoping if change space might change outcomes.  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   ? ?Interventions: Cognitive Behavioral Therapy, Assertiveness/Communication, and positive self talk.  ? ?Diagnosis:Bipolar I disorder ([place]) ? ?Plan: f/u w/ pt in 1 week for counseling to assist coping w/ stress, anxiety and mood stability.  Pt to f/u as scheduled w/ neurology and pt f/u as scheduled w/ PCP.  ? ?Treatment Plan  01/13/22 ? ?Client Abilities/Strengths  ? ?supports: my maternal uncle and his friend [name] and my best friend- who I met in college. pt enjoys her dogs, reading/journaling. physical therapy has helped /w my increased physical activity. advocating for herself.  ?Client Treatment Preferences  ? ?biweekly counseling to start. pt to continue w/ PCP to manage meds. continue to f/u w/ neurologist.  ?Client Statement of Needs  ? ?Pt "some ways I'm self-aware and other ways I'm not. hard to decipher why I do things certain ways. I want to continue to work through understanding patterns that I get stuck in and change those. I have trouble setting up boundaries and saying no to things and that causes issues. work on being assertive."  ?Treatment Level  ? ?outpatient counseling  ?Symptoms  ? ?Depressed or irritable mood.: improving ?Difficulty being assertive:  maintained.  ?History of at least one hypomanic, manic, or mixed mood episode. Improved ?Hypervigilance (e.g., feeling constantly on edge, experiencing concentration difficulties, having trouble falling or staying asleep, exhibiting a general state of irritability). Improving ?Lack of energy. maintained.  ?Motor tension (e.g., restlessness, tiredness, shakiness, muscle tension). Improving ?Sleeplessness or hypersomnia. improved  ?Problems Addressed  ? ?Bipolar Disorder -  Depression, Anxiety, Assertiveness ?Goals ? ?1. Develop healthy cognitive patterns and beliefs about self and the world that lead to alleviation and help prevent the relapse of mood episodes. ? ?Objective ? ?Identify and replace thoughts and  behaviors that trigger manic or depressive symptoms. ?Target Date: 2023-01-14 Frequency: Daily  ?Progress: 30 Modality: individual  ?Related Interventions ? ?Use cognitive therapy techniques to explore and educate the client about cognitive biases that trigger his/her elevated or depressive mood (see Cognitive Therapy for Bipolar Disorder by Lanny Hurst al.). ?2. Enhance ability to effectively cope with the full variety of life's worries and anxieties. ? ?Objective ? ?Learn and implement problem-solving strategies for realistically addressing worries. ?Target Date: 2023-01-14 Frequency: Daily  ?Progress: 50 Modality: individual  ?Related Interventions ? ?Teach the client problem-solving strategies involving specifically defining a problem, generating options for addressing it, evaluating the pros and cons of each option, selecting and implementing an optional action, and reevaluating and refining the action (or assign "Applying Problem-Solving to Interpersonal Conflict" in the Adult Psychotherapy Homework Planner by Bryn Gulling). ?Objective ? ?Identify, challenge, and replace biased, fearful self-talk with positive, realistic, and empowering self-talk. ?Target Date: 2023-01-14 Frequency: Daily  ?Progress: 50 Modality: individual  ?Related Interventions ? ?Explore the client's schema and self-talk that mediate his/her fear response; assist him/her in challenging the biases; replace the distorted messages with reality-based alternatives and positive, realistic self-talk that will increase his/her self-confidence in coping with irrational fears (see Cognitive Therapy of Anxiety Disorders by Alison Stalling). ?3. difficulty w/ being assertive and setting boundaries ? ?Objective ? ?Increase the frequency of assertive behaviors and setting healthy boundaries ?Target Date: 2023-01-14 Frequency: Daily  ?Progress: 0 Modality: individual  ?Related Interventions ? ?Teach assertive communication and healthy boundaries and ways to  impelent  ?  ? ?Conditions For Discharge ? ?Achievement of treatment goals and objectives ?  ?Pt participated in tx plan and provided verbal consent. ? ? ? ? Jan Fireman, [facility] ?

## 2022-02-09 NOTE — Progress Notes (Signed)
[place]

## 2022-02-11 ENCOUNTER — Ambulatory Visit (INDEPENDENT_AMBULATORY_CARE_PROVIDER_SITE_OTHER): Payer: 59 | Admitting: Psychology

## 2022-02-11 DIAGNOSIS — R69 Illness, unspecified: Secondary | ICD-10-CM | POA: Diagnosis not present

## 2022-02-11 DIAGNOSIS — F319 Bipolar disorder, unspecified: Secondary | ICD-10-CM | POA: Diagnosis not present

## 2022-02-11 NOTE — Progress Notes (Signed)
? ?  [facility] Counselor/Therapist Progress Note ? ?Patient ID: Victoria [name], MRN: [id number],   ? ?Date: 02/11/2022 ? ?Time Spent: 84:13KG-40:10UV  ? ?Treatment Type: Individual Therapy  pt is seen for a virtual video visit via caregility.  Pt joins from her home and counselor from her office.  ? ?Reported Symptoms: decreasing anxiety and depressed mood, fatigue, starting job while continues search ?Mental Status Exam: ?Appearance:  Well Groomed     ?Behavior: Appropriate  ?Motor: Normal  ?Speech/Language:  Normal Rate  ?Affect: Appropriate  ?Mood: normal  ?Thought process: normal  ?Thought content:   WNL  ?Sensory/Perceptual disturbances:   WNL  ?Orientation: oriented to person, place, time/date, and situation  ?Attention: Good  ?Concentration: Good  ?Memory: WNL  ?Fund of knowledge:  Good  ?Insight:   Good  ?Judgment:  Good  ?Impulse Control: Good  ? ?Risk Assessment: ?Danger to Self:  No ?Self-injurious Behavior: No ?Danger to Others: No ?Duty to Warn:no ?Physical Aggression / Violence:No  ?Access to Firearms a concern: No  ?Gang Involvement:No  ? ?Subjective: Counselor assessed pt current functioning per pt report.  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  Reflected positives w/ advocating for self.  Discussed ways to do w/ job that will be challenging w/her POTS.  Explored steps pt still taking for job search.   Pt affect wnl.  Pt reports mood has improved some.  Pt decided to start w/ serving job to assist w/ financial needs as continues to look for work.  Pt discussed how she has some appointment w/ new PCP and OBGYN and feels encouraged to continue adovcating for self.  Pt discussed some pain that impacting her physically currently.  Pt is identifying things to be proactive w/ taking this job and advocating for self.    ? ?Interventions: Cognitive Behavioral Therapy, Assertiveness/Communication, and positive self talk.  ? ?Diagnosis:Bipolar I disorder ([place]) ? ?Plan: f/u w/ pt  in 1 week for counseling to assist coping w/ stress, anxiety and mood stability.  Pt to f/u as scheduled w/ neurology and pt f/u as scheduled w/ PCP.  ? ?Treatment Plan  01/13/22 ? ?Client Abilities/Strengths  ? ?supports: my maternal uncle and his friend [name] and my best friend- who I met in college. pt enjoys her dogs, reading/journaling. physical therapy has helped /w my increased physical activity. advocating for herself.  ?Client Treatment Preferences  ? ?biweekly counseling to start. pt to continue w/ PCP to manage meds. continue to f/u w/ neurologist.  ?Client Statement of Needs  ? ?Pt "some ways I'm self-aware and other ways I'm not. hard to decipher why I do things certain ways. I want to continue to work through understanding patterns that I get stuck in and change those. I have trouble setting up boundaries and saying no to things and that causes issues. work on being assertive."  ?Treatment Level  ? ?outpatient counseling  ?Symptoms  ? ?Depressed or irritable mood.: improving ?Difficulty being assertive:  maintained.  ?History of at least one hypomanic, manic, or mixed mood episode. Improved ?Hypervigilance (e.g., feeling constantly on edge, experiencing concentration difficulties, having trouble falling or staying asleep, exhibiting a general state of irritability). Improving ?Lack of energy. maintained.  ?Motor tension (e.g., restlessness, tiredness, shakiness, muscle tension). Improving ?Sleeplessness or hypersomnia. improved  ?Problems Addressed  ? ?Bipolar Disorder - Depression, Anxiety, Assertiveness ?Goals ? ?1. Develop healthy cognitive patterns and beliefs about self and the world that lead to alleviation and help prevent  the relapse of mood episodes. ? ?Objective ? ?Identify and replace thoughts and behaviors that trigger manic or depressive symptoms. ?Target Date: 2023-01-14 Frequency: Daily  ?Progress: 30 Modality: individual  ?Related Interventions ? ?Use cognitive therapy techniques to  explore and educate the client about cognitive biases that trigger his/her elevated or depressive mood (see Cognitive Therapy for Bipolar Disorder by Lanny Hurst al.). ?2. Enhance ability to effectively cope with the full variety of life's worries and anxieties. ? ?Objective ? ?Learn and implement problem-solving strategies for realistically addressing worries. ?Target Date: 2023-01-14 Frequency: Daily  ?Progress: 50 Modality: individual  ?Related Interventions ? ?Teach the client problem-solving strategies involving specifically defining a problem, generating options for addressing it, evaluating the pros and cons of each option, selecting and implementing an optional action, and reevaluating and refining the action (or assign "Applying Problem-Solving to Interpersonal Conflict" in the Adult Psychotherapy Homework Planner by Bryn Gulling). ?Objective ? ?Identify, challenge, and replace biased, fearful self-talk with positive, realistic, and empowering self-talk. ?Target Date: 2023-01-14 Frequency: Daily  ?Progress: 50 Modality: individual  ?Related Interventions ? ?Explore the client's schema and self-talk that mediate his/her fear response; assist him/her in challenging the biases; replace the distorted messages with reality-based alternatives and positive, realistic self-talk that will increase his/her self-confidence in coping with irrational fears (see Cognitive Therapy of Anxiety Disorders by Alison Stalling). ?3. difficulty w/ being assertive and setting boundaries ? ?Objective ? ?Increase the frequency of assertive behaviors and setting healthy boundaries ?Target Date: 2023-01-14 Frequency: Daily  ?Progress: 0 Modality: individual  ?Related Interventions ? ?Teach assertive communication and healthy boundaries and ways to impelent  ?  ? ?Conditions For Discharge ? ?Achievement of treatment goals and objectives ?  ?Pt participated in tx plan and provided verbal consent. ? ? ? Jan Fireman, [facility] ?

## 2022-02-16 ENCOUNTER — Ambulatory Visit (INDEPENDENT_AMBULATORY_CARE_PROVIDER_SITE_OTHER): Payer: 59 | Admitting: Psychology

## 2022-02-16 DIAGNOSIS — F319 Bipolar disorder, unspecified: Secondary | ICD-10-CM | POA: Diagnosis not present

## 2022-02-16 DIAGNOSIS — R69 Illness, unspecified: Secondary | ICD-10-CM | POA: Diagnosis not present

## 2022-02-16 NOTE — Progress Notes (Signed)
? ?  [facility] Counselor/Therapist Progress Note ? ?Patient ID: Victoria [name], MRN: [id number],   ? ?Date: 02/16/2022 ? ?Time Spent: 46:80HO-12:24MG  ? ?Treatment Type: Individual Therapy  pt is seen for a virtual video visit via caregility.  Pt joins from her home and counselor from her home office.  ? ?Reported Symptoms: decreasing anxiety and depressed mood, fatigue, starting job while continues search ?Mental Status Exam: ?Appearance:  Well Groomed     ?Behavior: Appropriate  ?Motor: Normal  ?Speech/Language:  Normal Rate  ?Affect: Appropriate  ?Mood: normal  ?Thought process: normal  ?Thought content:   WNL  ?Sensory/Perceptual disturbances:   WNL  ?Orientation: oriented to person, place, time/date, and situation  ?Attention: Good  ?Concentration: Good  ?Memory: WNL  ?Fund of knowledge:  Good  ?Insight:   Good  ?Judgment:  Good  ?Impulse Control: Good  ? ?Risk Assessment: ?Danger to Self:  No ?Self-injurious Behavior: No ?Danger to Others: No ?Duty to Warn:no ?Physical Aggression / Violence:No  ?Access to Firearms a concern: No  ?Gang Involvement:No  ? ?Subjective: Counselor assessed pt current functioning per pt report.  Processed w/ starting job and stressors w/ brother living w/ her.  Discussed ways to step back and continue to advocate for self if impacts her health.  Reflected positive of setting boundary w/ brother and explored other ways to continue to reduce stress.   Pt affect wnl.  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  Pt reported that has been ok so far.  Pt reported she was able to choose her training scheduled and recognizes that schedule herself a lot of days w/ some long shifts.  Pt discussed how she is taking a day at a time and will adjust if not working.  Pt reported that she has decided that she is no longer paying for her brother's things and set boundary when he needed OTC meds.  Pt felt good about and recognized there other ways of asserting  even if having to repeat to keep from building resentment. ? ?Interventions: Cognitive Behavioral Therapy, Assertiveness/Communication, and positive self talk.  ? ?Diagnosis:Bipolar I disorder ([place]) ? ?Plan: f/u w/ pt in 2 weeks for counseling to assist coping w/ stress, anxiety and mood stability.  Pt to f/u as scheduled w/ neurology and pt f/u as scheduled w/ PCP.  ? ?Treatment Plan  01/13/22 ? ?Client Abilities/Strengths  ? ?supports: my maternal uncle and his friend [name] and my best friend- who I met in college. pt enjoys her dogs, reading/journaling. physical therapy has helped /w my increased physical activity. advocating for herself.  ?Client Treatment Preferences  ? ?biweekly counseling to start. pt to continue w/ PCP to manage meds. continue to f/u w/ neurologist.  ?Client Statement of Needs  ? ?Pt "some ways I'm self-aware and other ways I'm not. hard to decipher why I do things certain ways. I want to continue to work through understanding patterns that I get stuck in and change those. I have trouble setting up boundaries and saying no to things and that causes issues. work on being assertive."  ?Treatment Level  ? ?outpatient counseling  ?Symptoms  ? ?Depressed or irritable mood.: improving ?Difficulty being assertive:  maintained.  ?History of at least one hypomanic, manic, or mixed mood episode. Improved ?Hypervigilance (e.g., feeling constantly on edge, experiencing concentration difficulties, having trouble falling or staying asleep, exhibiting a general state of irritability). Improving ?Lack of energy. maintained.  ?Motor tension (e.g., restlessness,  tiredness, shakiness, muscle tension). Improving ?Sleeplessness or hypersomnia. improved  ?Problems Addressed  ? ?Bipolar Disorder - Depression, Anxiety, Assertiveness ?Goals ? ?1. Develop healthy cognitive patterns and beliefs about self and the world that lead to alleviation and help prevent the relapse of mood episodes. ? ?Objective ? ?Identify and  replace thoughts and behaviors that trigger manic or depressive symptoms. ?Target Date: 2023-01-14 Frequency: Daily  ?Progress: 30 Modality: individual  ?Related Interventions ? ?Use cognitive therapy techniques to explore and educate the client about cognitive biases that trigger his/her elevated or depressive mood (see Cognitive Therapy for Bipolar Disorder by Lanny Hurst al.). ?2. Enhance ability to effectively cope with the full variety of life's worries and anxieties. ? ?Objective ? ?Learn and implement problem-solving strategies for realistically addressing worries. ?Target Date: 2023-01-14 Frequency: Daily  ?Progress: 50 Modality: individual  ?Related Interventions ? ?Teach the client problem-solving strategies involving specifically defining a problem, generating options for addressing it, evaluating the pros and cons of each option, selecting and implementing an optional action, and reevaluating and refining the action (or assign "Applying Problem-Solving to Interpersonal Conflict" in the Adult Psychotherapy Homework Planner by Bryn Gulling). ?Objective ? ?Identify, challenge, and replace biased, fearful self-talk with positive, realistic, and empowering self-talk. ?Target Date: 2023-01-14 Frequency: Daily  ?Progress: 50 Modality: individual  ?Related Interventions ? ?Explore the client's schema and self-talk that mediate his/her fear response; assist him/her in challenging the biases; replace the distorted messages with reality-based alternatives and positive, realistic self-talk that will increase his/her self-confidence in coping with irrational fears (see Cognitive Therapy of Anxiety Disorders by Alison Stalling). ?3. difficulty w/ being assertive and setting boundaries ? ?Objective ? ?Increase the frequency of assertive behaviors and setting healthy boundaries ?Target Date: 2023-01-14 Frequency: Daily  ?Progress: 0 Modality: individual  ?Related Interventions ? ?Teach assertive communication and healthy  boundaries and ways to impelent  ?  ? ?Conditions For Discharge ? ?Achievement of treatment goals and objectives ?  ?Pt participated in tx plan and provided verbal consent. ? ? ? ?Jan Fireman, [facility] ? ? ? ? ? ? ? ? ? ? ? ? ? ? Jan Fireman, [facility] ?

## 2022-02-23 DIAGNOSIS — Z0000 Encounter for general adult medical examination without abnormal findings: Secondary | ICD-10-CM | POA: Diagnosis not present

## 2022-02-23 DIAGNOSIS — Z13220 Encounter for screening for lipoid disorders: Secondary | ICD-10-CM | POA: Diagnosis not present

## 2022-02-23 DIAGNOSIS — R5383 Other fatigue: Secondary | ICD-10-CM | POA: Diagnosis not present

## 2022-02-24 ENCOUNTER — Telehealth: Payer: Self-pay

## 2022-02-24 NOTE — Telephone Encounter (Signed)
NOTES SCANNED TO REFERRAL 

## 2022-03-02 ENCOUNTER — Ambulatory Visit (INDEPENDENT_AMBULATORY_CARE_PROVIDER_SITE_OTHER): Payer: 59 | Admitting: Psychology

## 2022-03-02 DIAGNOSIS — R69 Illness, unspecified: Secondary | ICD-10-CM | POA: Diagnosis not present

## 2022-03-02 DIAGNOSIS — F319 Bipolar disorder, unspecified: Secondary | ICD-10-CM | POA: Diagnosis not present

## 2022-03-02 NOTE — Progress Notes (Signed)
[facility] Counselor/Therapist Progress Note  Patient ID: Victoria [name], MRN: [id number],    Date: 03/02/2022  Time Spent: 11:32am-12:19am   Treatment Type: Individual Therapy  pt is seen for a virtual video visit via caregility.  Pt joins from her home and counselor from her home office.   Reported Symptoms: decreasing anxiety and depressed mood, continued fatigue, stressors w/ medical concerns Mental Status Exam: Appearance:  Well Groomed     Behavior: Appropriate  Motor: Normal  Speech/Language:  Normal Rate  Affect: Appropriate  Mood: normal  Thought process: normal  Thought content:   WNL  Sensory/Perceptual disturbances:   WNL  Orientation: oriented to person, place, time/date, and situation  Attention: Good  Concentration: Good  Memory: WNL  Fund of knowledge:  Good  Insight:   Good  Judgment:  Good  Impulse Control: Good   Risk Assessment: Danger to Self:  No Self-injurious Behavior: No Danger to Others: No Duty to Warn:no Physical Aggression / Violence:No  Access to Firearms a concern: No  Gang Involvement:No   Subjective: Counselor assessed pt current functioning per pt report.  Processed w/ job transition, medical stressors and mood. Validated disappointment and frustration w/ medical concerns and struggles w/ getting referrals from PCP.  Discussed awareness of limitations and advocating for less physically demanding shifts.  Reflected positives of setting boundaries w/ her brother.  Pt affect wnl.  Pt reported that work is going ok.  Pt reported that she does feels impact physically of being on feet for her shifts but feels manageable.  Pt discussed that did need to decide to not be scheduled for more demanding shift.  Pt reported that she meet w/ new PCP and was extremely disappointed as felt that her concerns and requests were dismissed.  Pt reported that PCP informed that they had a POTS specialist but would refer to.  Pt discussed  continuing to advocate to work w/ providers to assist in learning how to better manage POTS.  Pt discussed boundary setting w/ her brother re: money for living expenses.    Interventions: Cognitive Behavioral Therapy, Assertiveness/Communication, and positive self talk.   Diagnosis:Bipolar I disorder ([place])  Plan: f/u w/ pt in 2 weeks for counseling to assist coping w/ stress, anxiety and mood stability.  Pt to f/u as scheduled w/ neurology and pt f/u as scheduled w/ PCP.   Treatment Plan  01/13/22  Client Abilities/Strengths   supports: my maternal uncle and his friend [name] and my best friend- who I met in college. pt enjoys her dogs, reading/journaling. physical therapy has helped /w my increased physical activity. advocating for herself.  Client Treatment Preferences   biweekly counseling to start. pt to continue w/ PCP to manage meds. continue to f/u w/ neurologist.  Client Statement of Needs   Pt "some ways I'm self-aware and other ways I'm not. hard to decipher why I do things certain ways. I want to continue to work through understanding patterns that I get stuck in and change those. I have trouble setting up boundaries and saying no to things and that causes issues. work on being assertive."  Treatment Level   outpatient counseling  Symptoms   Depressed or irritable mood.: improving Difficulty being assertive:  maintained.  History of at least one hypomanic, manic, or mixed mood episode. Improved Hypervigilance (e.g., feeling constantly on edge, experiencing concentration difficulties, having trouble falling or staying asleep, exhibiting a general state of irritability). Improving Lack of energy. maintained.  Motor  tension (e.g., restlessness, tiredness, shakiness, muscle tension). Improving Sleeplessness or hypersomnia. improved  Problems Addressed   Bipolar Disorder - Depression, Anxiety, Assertiveness Goals  1. Develop healthy cognitive patterns and beliefs about self  and the world that lead to alleviation and help prevent the relapse of mood episodes.  Objective  Identify and replace thoughts and behaviors that trigger manic or depressive symptoms. Target Date: 2023-01-14 Frequency: Daily  Progress: 30 Modality: individual  Related Interventions  Use cognitive therapy techniques to explore and educate the client about cognitive biases that trigger his/her elevated or depressive mood (see Cognitive Therapy for Bipolar Disorder by Lanny Hurst al.). 2. Enhance ability to effectively cope with the full variety of life's worries and anxieties.  Objective  Learn and implement problem-solving strategies for realistically addressing worries. Target Date: 2023-01-14 Frequency: Daily  Progress: 50 Modality: individual  Related Interventions  Teach the client problem-solving strategies involving specifically defining a problem, generating options for addressing it, evaluating the pros and cons of each option, selecting and implementing an optional action, and reevaluating and refining the action (or assign "Applying Problem-Solving to Interpersonal Conflict" in the Adult Psychotherapy Homework Planner by Bryn Gulling). Objective  Identify, challenge, and replace biased, fearful self-talk with positive, realistic, and empowering self-talk. Target Date: 2023-01-14 Frequency: Daily  Progress: 50 Modality: individual  Related Interventions  Explore the client's schema and self-talk that mediate his/her fear response; assist him/her in challenging the biases; replace the distorted messages with reality-based alternatives and positive, realistic self-talk that will increase his/her self-confidence in coping with irrational fears (see Cognitive Therapy of Anxiety Disorders by Alison Stalling). 3. difficulty w/ being assertive and setting boundaries  Objective  Increase the frequency of assertive behaviors and setting healthy boundaries Target Date: 2023-01-14 Frequency:  Daily  Progress: 0 Modality: individual  Related Interventions  Teach assertive communication and healthy boundaries and ways to impelent     Conditions For Discharge  Achievement of treatment goals and objectives   Pt participated in tx plan and provided verbal consent.    Jan Fireman, [facility]

## 2022-03-10 ENCOUNTER — Encounter: Payer: Self-pay | Admitting: Internal Medicine

## 2022-03-10 ENCOUNTER — Ambulatory Visit (INDEPENDENT_AMBULATORY_CARE_PROVIDER_SITE_OTHER): Payer: 59 | Admitting: Internal Medicine

## 2022-03-10 VITALS — BP 118/70 | HR 75 | Resp 18 | Ht 64.0 in | Wt 175.2 lb

## 2022-03-10 DIAGNOSIS — R519 Headache, unspecified: Secondary | ICD-10-CM | POA: Diagnosis not present

## 2022-03-10 DIAGNOSIS — G90A Postural orthostatic tachycardia syndrome (POTS): Secondary | ICD-10-CM

## 2022-03-10 DIAGNOSIS — G8929 Other chronic pain: Secondary | ICD-10-CM | POA: Diagnosis not present

## 2022-03-10 DIAGNOSIS — Z0001 Encounter for general adult medical examination with abnormal findings: Secondary | ICD-10-CM

## 2022-03-10 NOTE — Patient Instructions (Signed)
Please continue to take medications as prescribed.  Please continue to maintain adequate hydration and eat at regular intervals.

## 2022-03-10 NOTE — Progress Notes (Unsigned)
Established Patient Office Visit  Subjective:  Patient ID: Victoria [name], female    DOB: 1996/05/19  Age: 26 y.o. MRN: [id number]  CC:  Chief Complaint  Patient presents with   Annual Exam    Annual exam     HPI [name] is a 26 y.o. female with past medical history of POTS, anxiety and bipolar disorder who presents for annual physical.  She still complains of episodes of headache, also associated with numbness and tingling of the UE.  She was evaluated by neurologist.  She also reports mild nausea at times.  She also complains of intermittent dizziness.  Of note, she has history of POTS and takes fludrocortisone for it.  She has been evaluated by cardiology in the past.  She has been doing behavioral therapy for history of GAD/bipolar disorder. She used to take Lamictal and Zoloft for her depression and anxiety, but has not been able to see any local psychiatrist recently.  She was referred to [facility] therapist in the last visit, who has been helping with her anxiety.  She has not been set up with any psychiatrist yet.  She denies any SI or HI currently.    Past Medical History:  Diagnosis Date   Abnormal Papanicolaou smear of cervix with positive human papilloma virus (HPV) test 09/08/2020   Pap was LSIL +HPV:  Needs colpo per ASCCP guidelines,  CIN 3+ immediate risk is 4.3%______________   Bipolar disorder (HCC)    Mitral valve prolapse    2D echo 09/2021 showed mild anterior mitral valve prolapse with mild MR   POTS (postural orthostatic tachycardia syndrome)     Past Surgical History:  Procedure Laterality Date   EXTRACORPOREAL CIRCULATION     LASER ABLATION CONDOLAMATA N/A 10/24/2020   Procedure: LASER ABLATION of the cervix;  Surgeon: Lazaro Arms, MD;  Location: [facility];  Service: Gynecology;  Laterality: N/A;    Family History  Problem Relation Age of Onset   Diabetes Paternal Grandfather    Colon cancer Maternal Grandfather     Social History    Socioeconomic History   Marital status: Legally Separated    Spouse name: Not on file   Number of children: Not on file   Years of education: Not on file   Highest education level: Some college, no degree  Occupational History   Not on file  Tobacco Use   Smoking status: Never   Smokeless tobacco: Never  Vaping Use   Vaping Use: Former  Substance and Sexual Activity   Alcohol use: Yes    Comment: occ   Drug use: No   Sexual activity: Not Currently    Birth control/protection: Implant  Other Topics Concern   Not on file  Social History Narrative   Lives with younger brother (age 29)   Right handed   Caffeine: 1 C of coffee in the AM. Cut off soda a few months ago.    Social Determinants of Health   Financial Resource Strain: Not on file  Food Insecurity: Not on file  Transportation Needs: Not on file  Physical Activity: Not on file  Stress: Not on file  Social Connections: Not on file  Intimate Partner Violence: Not on file    Outpatient Medications Prior to Visit  Medication Sig Dispense Refill   etonogestrel (NEXPLANON) 68 MG IMPL implant 1 each by Subdermal route once.     fludrocortisone (FLORINEF) 0.1 MG tablet TAKE 1 TABLET BY MOUTH TWICE A DAY 60  tablet 1   fluticasone (FLONASE) 50 MCG/ACT nasal spray Place 1 spray into both nostrils daily. 16 g 2   hydrOXYzine (VISTARIL) 25 MG capsule Take 1 capsule (25 mg total) by mouth every 8 (eight) hours as needed. 30 capsule 2   ondansetron (ZOFRAN ODT) 8 MG disintegrating tablet Take 1 tablet (8 mg total) by mouth every 8 (eight) hours as needed for nausea or vomiting. 8 tablet 0   pantoprazole (PROTONIX) 20 MG tablet Take 1 tablet (20 mg total) by mouth daily. 30 tablet 1   No facility-administered medications prior to visit.    No Known Allergies  ROS Review of Systems  Constitutional:  Negative for chills and fever.  HENT:  Negative for congestion, sinus pressure, sinus pain and sore throat.   Eyes:   Negative for pain, discharge and redness.  Respiratory:  Negative for cough and shortness of breath.   Cardiovascular:  Negative for palpitations and leg swelling.  Gastrointestinal:  Negative for abdominal pain, diarrhea, nausea and vomiting.  Endocrine: Negative for polydipsia and polyuria.  Genitourinary:  Negative for dysuria and hematuria.  Musculoskeletal:  Negative for back pain, neck pain and neck stiffness.  Skin:  Negative for rash.  Neurological:  Positive for dizziness, numbness and headaches. Negative for weakness.  Hematological:  Does not bruise/bleed easily.  Psychiatric/Behavioral:  Negative for agitation and behavioral problems.      Objective:    Physical Exam Vitals reviewed.  Constitutional:      General: She is not in acute distress.    Appearance: She is not diaphoretic.  HENT:     Head: Normocephalic and atraumatic.     Nose: Nose normal. No congestion.     Mouth/Throat:     Mouth: Mucous membranes are moist.     Pharynx: No posterior oropharyngeal erythema.  Eyes:     General: No scleral icterus.    Extraocular Movements: Extraocular movements intact.  Cardiovascular:     Rate and Rhythm: Normal rate and regular rhythm.     Pulses: Normal pulses.     Heart sounds: Normal heart sounds. No murmur heard. Pulmonary:     Breath sounds: Normal breath sounds. No wheezing or rales.  Abdominal:     Palpations: Abdomen is soft.     Tenderness: There is no abdominal tenderness.  Musculoskeletal:     Cervical back: Neck supple. No tenderness.     Right lower leg: No edema.     Left lower leg: No edema.  Skin:    General: Skin is warm.     Findings: No rash.  Neurological:     General: No focal deficit present.     Mental Status: She is alert and oriented to person, place, and time.     Cranial Nerves: No cranial nerve deficit.     Sensory: No sensory deficit.     Motor: No weakness.  Psychiatric:        Mood and Affect: Mood normal.        Behavior:  Behavior normal.    BP 118/70 (BP Location: Right Arm, Patient Position: Sitting, Cuff Size: Normal)   Pulse 75   Resp 18   Ht 5\' 4"  (1.626 m)   Wt 175 lb 3.2 oz (79.5 kg)   SpO2 98%   BMI 30.07 kg/m  Wt Readings from Last 3 Encounters:  03/10/22 175 lb 3.2 oz (79.5 kg)  01/08/22 171 lb 9.6 oz (77.8 kg)  10/28/21 176 lb (79.8 kg)  Lab Results  Component Value Date   TSH 0.949 08/28/2020   Lab Results  Component Value Date   WBC 6.4 10/28/2021   HGB 13.9 10/28/2021   HCT 40.8 10/28/2021   MCV 91.3 10/28/2021   PLT 274 10/28/2021   Lab Results  Component Value Date   NA 135 10/28/2021   K 3.6 10/28/2021   CO2 24 10/28/2021   GLUCOSE 92 10/28/2021   BUN 9 10/28/2021   CREATININE 0.80 10/28/2021   BILITOT 0.7 10/28/2021   ALKPHOS 58 10/28/2021   AST 19 10/28/2021   ALT 14 10/28/2021   PROT 6.8 10/28/2021   ALBUMIN 3.9 10/28/2021   CALCIUM 9.0 10/28/2021   ANIONGAP 7 10/28/2021   No results found for: CHOL No results found for: HDL No results found for: LDLCALC No results found for: TRIG No results found for: CHOLHDL No results found for: HYWV3X    Assessment & Plan:   Problem List Items Addressed This Visit       Cardiovascular and Mediastinum   Postural orthostatic tachycardia syndrome    Takes Fludrocortisone 0.1 mg BID Advised to avoid sudden changes in posture CMP was wnl in the past Her headache symptoms are likely migraine, has been evaluated by Neurology         Other   Chronic nonintractable headache    Could be related to anxiety/stress Has been evaluated by neurology -discussed with neurologist, likely migraine -advised to consider Topamax and triptan -tried to call patient to discuss, was unable to reach, left VM.       Encounter for general adult medical examination with abnormal findings - Primary    Physical exam as documented. Fasting blood tests recently done at [facility] clinic, had B12 def. Will try to obtain records.         No orders of the defined types were placed in this encounter.   Follow-up: Return in about 6 months (around 09/09/2022).    Anabel Halon, MD

## 2022-03-12 DIAGNOSIS — Z0001 Encounter for general adult medical examination with abnormal findings: Secondary | ICD-10-CM | POA: Insufficient documentation

## 2022-03-12 NOTE — Assessment & Plan Note (Signed)
Could be related to anxiety/stress Has been evaluated by neurology -discussed with neurologist, likely migraine -advised to consider Topamax and triptan -tried to call patient to discuss, was unable to reach, left VM.

## 2022-03-12 NOTE — Assessment & Plan Note (Signed)
Physical exam as documented. Fasting blood tests recently done at [facility] clinic, had B12 def. Will try to obtain records.

## 2022-03-12 NOTE — Assessment & Plan Note (Signed)
Takes Fludrocortisone 0.1 mg BID Advised to avoid sudden changes in posture CMP was wnl in the past Her headache symptoms are likely migraine, has been evaluated by Neurology

## 2022-03-16 ENCOUNTER — Ambulatory Visit (INDEPENDENT_AMBULATORY_CARE_PROVIDER_SITE_OTHER): Payer: 59 | Admitting: Psychology

## 2022-03-16 DIAGNOSIS — F319 Bipolar disorder, unspecified: Secondary | ICD-10-CM

## 2022-03-16 DIAGNOSIS — R69 Illness, unspecified: Secondary | ICD-10-CM | POA: Diagnosis not present

## 2022-03-16 NOTE — Progress Notes (Signed)
[facility] Counselor/Therapist Progress Note  Patient ID: PRESSLEY BARSKY, MRN: [id number],    Date: 03/16/2022  Time Spent: 11:06am-11:56am   Treatment Type: Individual Therapy  pt is seen for a virtual video visit via caregility.  Pt joins from her home and counselor from her home office.   Reported Symptoms: decreasing anxiety and depressed mood, continued fatigue, stressors w/ housing/roommate Mental Status Exam: Appearance:  Well Groomed     Behavior: Appropriate  Motor: Normal  Speech/Language:  Normal Rate  Affect: Appropriate  Mood: normal  Thought process: normal  Thought content:   WNL  Sensory/Perceptual disturbances:   WNL  Orientation: oriented to person, place, time/date, and situation  Attention: Good  Concentration: Good  Memory: WNL  Fund of knowledge:  Good  Insight:   Good  Judgment:  Good  Impulse Control: Good   Risk Assessment: Danger to Self:  No Self-injurious Behavior: No Danger to Others: No Duty to Warn:no Physical Aggression / Violence:No  Access to Firearms a concern: No  Gang Involvement:No   Subjective: Counselor assessed pt current functioning per pt report.  Processed w/ stressors and coping. Validated frustrations w/ housing, finances, roommate stressors.  Reflected positives with asserting self and focusing on ruminating on frustrations.   Pt affect wnl.  Pt reported that work is going ok and does feel fatigue and some pain flare ups but feels that managing.  Pt reported that saw PCP and advocating to see specialist and PCP agreed to seek referral.  Pt reported frustrations w/ rental home repairs being ignored and recognizing that having roommate w/ brother who increased her expenses and causing more stress.  Pt discussed asserting self in these w/ communicating needs.   Interventions: Cognitive Behavioral Therapy, Assertiveness/Communication, and positive self talk.   Diagnosis:Bipolar I disorder ([place])  Plan: f/u w/  pt in 2 weeks for counseling to assist coping w/ stress, anxiety and mood stability.  Pt to f/u as scheduled w/ neurology and pt f/u as scheduled w/ PCP.   Treatment Plan  01/13/22  Client Abilities/Strengths   supports: my maternal uncle and his friend [name] and my best friend- who I met in college. pt enjoys her dogs, reading/journaling. physical therapy has helped /w my increased physical activity. advocating for herself.  Client Treatment Preferences   biweekly counseling to start. pt to continue w/ PCP to manage meds. continue to f/u w/ neurologist.  Client Statement of Needs   Pt "some ways I'm self-aware and other ways I'm not. hard to decipher why I do things certain ways. I want to continue to work through understanding patterns that I get stuck in and change those. I have trouble setting up boundaries and saying no to things and that causes issues. work on being assertive."  Treatment Level   outpatient counseling  Symptoms   Depressed or irritable mood.: improving Difficulty being assertive:  maintained.  History of at least one hypomanic, manic, or mixed mood episode. Improved Hypervigilance (e.g., feeling constantly on edge, experiencing concentration difficulties, having trouble falling or staying asleep, exhibiting a general state of irritability). Improving Lack of energy. maintained.  Motor tension (e.g., restlessness, tiredness, shakiness, muscle tension). Improving Sleeplessness or hypersomnia. improved  Problems Addressed   Bipolar Disorder - Depression, Anxiety, Assertiveness Goals  1. Develop healthy cognitive patterns and beliefs about self and the world that lead to alleviation and help prevent the relapse of mood episodes.  Objective  Identify and replace thoughts and behaviors that trigger  manic or depressive symptoms. Target Date: 2023-01-14 Frequency: Daily  Progress: 30 Modality: individual  Related Interventions  Use cognitive therapy techniques to  explore and educate the client about cognitive biases that trigger his/her elevated or depressive mood (see Cognitive Therapy for Bipolar Disorder by Lanny Hurst al.). 2. Enhance ability to effectively cope with the full variety of life's worries and anxieties.  Objective  Learn and implement problem-solving strategies for realistically addressing worries. Target Date: 2023-01-14 Frequency: Daily  Progress: 50 Modality: individual  Related Interventions  Teach the client problem-solving strategies involving specifically defining a problem, generating options for addressing it, evaluating the pros and cons of each option, selecting and implementing an optional action, and reevaluating and refining the action (or assign "Applying Problem-Solving to Interpersonal Conflict" in the Adult Psychotherapy Homework Planner by Bryn Gulling). Objective  Identify, challenge, and replace biased, fearful self-talk with positive, realistic, and empowering self-talk. Target Date: 2023-01-14 Frequency: Daily  Progress: 50 Modality: individual  Related Interventions  Explore the client's schema and self-talk that mediate his/her fear response; assist him/her in challenging the biases; replace the distorted messages with reality-based alternatives and positive, realistic self-talk that will increase his/her self-confidence in coping with irrational fears (see Cognitive Therapy of Anxiety Disorders by Alison Stalling). 3. difficulty w/ being assertive and setting boundaries  Objective  Increase the frequency of assertive behaviors and setting healthy boundaries Target Date: 2023-01-14 Frequency: Daily  Progress: 0 Modality: individual  Related Interventions  Teach assertive communication and healthy boundaries and ways to impelent     Conditions For Discharge  Achievement of treatment goals and objectives   Pt participated in tx plan and provided verbal consent.    Jan Fireman, [facility]

## 2022-03-17 ENCOUNTER — Encounter: Payer: Self-pay | Admitting: Internal Medicine

## 2022-03-17 ENCOUNTER — Encounter: Payer: Self-pay | Admitting: Diagnostic Neuroimaging

## 2022-03-26 NOTE — Telephone Encounter (Signed)
Please see mychart message when you return.

## 2022-03-28 ENCOUNTER — Other Ambulatory Visit: Payer: Self-pay | Admitting: Internal Medicine

## 2022-03-28 DIAGNOSIS — G90A Postural orthostatic tachycardia syndrome (POTS): Secondary | ICD-10-CM

## 2022-03-30 ENCOUNTER — Other Ambulatory Visit: Payer: Self-pay | Admitting: Internal Medicine

## 2022-03-30 ENCOUNTER — Ambulatory Visit (INDEPENDENT_AMBULATORY_CARE_PROVIDER_SITE_OTHER): Payer: 59 | Admitting: Psychology

## 2022-03-30 DIAGNOSIS — R519 Headache, unspecified: Secondary | ICD-10-CM

## 2022-03-30 DIAGNOSIS — F319 Bipolar disorder, unspecified: Secondary | ICD-10-CM | POA: Diagnosis not present

## 2022-03-30 DIAGNOSIS — G90A Postural orthostatic tachycardia syndrome (POTS): Secondary | ICD-10-CM

## 2022-03-30 DIAGNOSIS — R69 Illness, unspecified: Secondary | ICD-10-CM | POA: Diagnosis not present

## 2022-03-30 MED ORDER — SUMATRIPTAN SUCCINATE 50 MG PO TABS
50.0000 mg | ORAL_TABLET | ORAL | 0 refills | Status: DC | PRN
Start: 2022-03-30 — End: 2022-05-26

## 2022-03-30 NOTE — Progress Notes (Signed)
[place] Counselor/Therapist Progress Note  Patient ID: Victoria [name], MRN: [id number],    Date: 03/30/2022  Time Spent: 11:03am-11:48am   Treatment Type: Individual Therapy  pt is seen for a virtual video visit via caregility.  Pt joins from her home and counselor from her home office.   Reported Symptoms:  continued fatigue, stressors/frustation w/ housing/roommate  Mental Status Exam: Appearance:  Well Groomed     Behavior: Appropriate  Motor: Normal  Speech/Language:  Normal Rate  Affect: Appropriate  Mood: normal  Thought process: normal  Thought content:   WNL  Sensory/Perceptual disturbances:   WNL  Orientation: oriented to person, place, time/date, and situation  Attention: Good  Concentration: Good  Memory: WNL  Fund of knowledge:  Good  Insight:   Good  Judgment:  Good  Impulse Control: Good   Risk Assessment: Danger to Self:  No Self-injurious Behavior: No Danger to Others: No Duty to Warn:no Physical Aggression / Violence:No  Access to Firearms a concern: No  Gang Involvement:No   Subjective: Counselor assessed pt current functioning per pt report.  Processed w/ stressors and positives and impact on mood- functioning.  Validated frustrations and coping skills focusing on what is in her control.  Discussed continued ways of asserting and decisions making re: roommate.  Pt affect wnl.  Pt reported that work is going ok and figured out pay discrepancies and resolving w/ Dance movement psychotherapist.  Pt recognizes impact of standing on feet w/her POTS and flare ups.  Pt discussed frustrations w/ uncle not addressing upkeep on rental and stepbrother not keeping up his agreement for being roommate.  Pt discussed considering whether he should leave.  Pt discussed assertive communication options w/ uncle re: repairs needed.      Interventions: Cognitive Behavioral Therapy, Assertiveness/Communication, and positive self talk.   Diagnosis:Bipolar I disorder  ([place])  Plan: f/u w/ pt in 2 weeks for counseling to assist coping w/ stress, anxiety and mood stability.  Pt to f/u as scheduled w/ neurology and pt f/u as scheduled w/ PCP.   Treatment Plan  01/13/22  Client Abilities/Strengths   supports: my maternal uncle and his friend [name] and my best friend- who I met in college. pt enjoys her dogs, reading/journaling. physical therapy has helped /w my increased physical activity. advocating for herself.  Client Treatment Preferences   biweekly counseling to start. pt to continue w/ PCP to manage meds. continue to f/u w/ neurologist.  Client Statement of Needs   Pt "some ways I'm self-aware and other ways I'm not. hard to decipher why I do things certain ways. I want to continue to work through understanding patterns that I get stuck in and change those. I have trouble setting up boundaries and saying no to things and that causes issues. work on being assertive."  Treatment Level   outpatient counseling  Symptoms   Depressed or irritable mood.: improving Difficulty being assertive:  maintained.  History of at least one hypomanic, manic, or mixed mood episode. Improved Hypervigilance (e.g., feeling constantly on edge, experiencing concentration difficulties, having trouble falling or staying asleep, exhibiting a general state of irritability). Improving Lack of energy. maintained.  Motor tension (e.g., restlessness, tiredness, shakiness, muscle tension). Improving Sleeplessness or hypersomnia. improved  Problems Addressed   Bipolar Disorder - Depression, Anxiety, Assertiveness Goals  1. Develop healthy cognitive patterns and beliefs about self and the world that lead to alleviation and help prevent the relapse of mood episodes.  Objective  Identify  and replace thoughts and behaviors that trigger manic or depressive symptoms. Target Date: 2023-01-14 Frequency: Daily  Progress: 30 Modality: individual  Related Interventions  Use cognitive  therapy techniques to explore and educate the client about cognitive biases that trigger his/her elevated or depressive mood (see Cognitive Therapy for Bipolar Disorder by Lanny Hurst al.). 2. Enhance ability to effectively cope with the full variety of life's worries and anxieties.  Objective  Learn and implement problem-solving strategies for realistically addressing worries. Target Date: 2023-01-14 Frequency: Daily  Progress: 50 Modality: individual  Related Interventions  Teach the client problem-solving strategies involving specifically defining a problem, generating options for addressing it, evaluating the pros and cons of each option, selecting and implementing an optional action, and reevaluating and refining the action (or assign "Applying Problem-Solving to Interpersonal Conflict" in the Adult Psychotherapy Homework Planner by Bryn Gulling). Objective  Identify, challenge, and replace biased, fearful self-talk with positive, realistic, and empowering self-talk. Target Date: 2023-01-14 Frequency: Daily  Progress: 50 Modality: individual  Related Interventions  Explore the client's schema and self-talk that mediate his/her fear response; assist him/her in challenging the biases; replace the distorted messages with reality-based alternatives and positive, realistic self-talk that will increase his/her self-confidence in coping with irrational fears (see Cognitive Therapy of Anxiety Disorders by Alison Stalling). 3. difficulty w/ being assertive and setting boundaries  Objective  Increase the frequency of assertive behaviors and setting healthy boundaries Target Date: 2023-01-14 Frequency: Daily  Progress: 0 Modality: individual  Related Interventions  Teach assertive communication and healthy boundaries and ways to impelent     Conditions For Discharge  Achievement of treatment goals and objectives   Pt participated in tx plan and provided verbal consent.    Jan Fireman, [facility]

## 2022-04-06 ENCOUNTER — Other Ambulatory Visit: Payer: Self-pay | Admitting: *Deleted

## 2022-04-06 DIAGNOSIS — R251 Tremor, unspecified: Secondary | ICD-10-CM

## 2022-04-06 DIAGNOSIS — R519 Headache, unspecified: Secondary | ICD-10-CM

## 2022-04-06 DIAGNOSIS — R200 Anesthesia of skin: Secondary | ICD-10-CM

## 2022-04-12 ENCOUNTER — Ambulatory Visit (INDEPENDENT_AMBULATORY_CARE_PROVIDER_SITE_OTHER): Payer: 59 | Admitting: Psychology

## 2022-04-12 DIAGNOSIS — F319 Bipolar disorder, unspecified: Secondary | ICD-10-CM | POA: Diagnosis not present

## 2022-04-12 DIAGNOSIS — R69 Illness, unspecified: Secondary | ICD-10-CM | POA: Diagnosis not present

## 2022-04-12 NOTE — Progress Notes (Signed)
[place] Counselor/Therapist Progress Note  Patient ID: Victoria [name], MRN: [id number],    Date: 04/12/2022  Time Spent: 11:04am-11:52am   Treatment Type: Individual Therapy  pt is seen for a virtual video visit via caregility.  Pt joins from her home and counselor from her home office.   Reported Symptoms:  some discouraged, stressors/frustation w/ housing/roommate  Mental Status Exam: Appearance:  Well Groomed     Behavior: Appropriate  Motor: Normal  Speech/Language:  Normal Rate  Affect: Appropriate  Mood: discouraged  Thought process: normal  Thought content:   WNL  Sensory/Perceptual disturbances:   WNL  Orientation: oriented to person, place, time/date, and situation  Attention: Good  Concentration: Good  Memory: WNL  Fund of knowledge:  Good  Insight:   Good  Judgment:  Good  Impulse Control: Good   Risk Assessment: Danger to Self:  No Self-injurious Behavior: No Danger to Others: No Duty to Warn:no Physical Aggression / Violence:No  Access to Firearms a concern: No  Gang Involvement:No   Subjective: Counselor assessed pt current functioning per pt report.  Processed w/ stressors and and mood.  Validated frustrations and acknowledging how not fair. Discussed continued ways of asserting and making decisions on how to approach.  Reflected positives of how she is coping.  Pt affect wnl.  Pt reported that work has been ok.  Pt reported frustrations w/ housing and uncle not willing to fix things (A[employer] not working).  Pt also discouraged as mom not helping financially as promised.  Pt feels taking advantage of.  Pt validates her feelings and identifies focus on caring for self in ways she can control.  Pt did reported some positives w/ response from PCP ordering B12 shots and referred to functional neurologist.     Interventions: Cognitive Behavioral Therapy, Assertiveness/Communication, and positive self talk.   Diagnosis:Bipolar I disorder  ([place])  Plan: f/u w/ pt in 2 weeks for counseling to assist coping w/ stress, anxiety and mood stability.  Pt to f/u as scheduled w/ neurology and pt f/u as scheduled w/ PCP.   Treatment Plan  01/13/22  Client Abilities/Strengths   supports: my maternal uncle and his friend [name] and my best friend- who I met in college. pt enjoys her dogs, reading/journaling. physical therapy has helped /w my increased physical activity. advocating for herself.  Client Treatment Preferences   biweekly counseling to start. pt to continue w/ PCP to manage meds. continue to f/u w/ neurologist.  Client Statement of Needs   Pt "some ways I'm self-aware and other ways I'm not. hard to decipher why I do things certain ways. I want to continue to work through understanding patterns that I get stuck in and change those. I have trouble setting up boundaries and saying no to things and that causes issues. work on being assertive."  Treatment Level   outpatient counseling  Symptoms   Depressed or irritable mood.: improving Difficulty being assertive:  maintained.  History of at least one hypomanic, manic, or mixed mood episode. Improved Hypervigilance (e.g., feeling constantly on edge, experiencing concentration difficulties, having trouble falling or staying asleep, exhibiting a general state of irritability). Improving Lack of energy. maintained.  Motor tension (e.g., restlessness, tiredness, shakiness, muscle tension). Improving Sleeplessness or hypersomnia. improved  Problems Addressed   Bipolar Disorder - Depression, Anxiety, Assertiveness Goals  1. Develop healthy cognitive patterns and beliefs about self and the world that lead to alleviation and help prevent the relapse of mood episodes.  Objective  Identify and replace thoughts and behaviors that trigger manic or depressive symptoms. Target Date: 2023-01-14 Frequency: Daily  Progress: 30 Modality: individual  Related Interventions  Use cognitive  therapy techniques to explore and educate the client about cognitive biases that trigger his/her elevated or depressive mood (see Cognitive Therapy for Bipolar Disorder by Lanny Hurst al.). 2. Enhance ability to effectively cope with the full variety of life's worries and anxieties.  Objective  Learn and implement problem-solving strategies for realistically addressing worries. Target Date: 2023-01-14 Frequency: Daily  Progress: 50 Modality: individual  Related Interventions  Teach the client problem-solving strategies involving specifically defining a problem, generating options for addressing it, evaluating the pros and cons of each option, selecting and implementing an optional action, and reevaluating and refining the action (or assign "Applying Problem-Solving to Interpersonal Conflict" in the Adult Psychotherapy Homework Planner by Bryn Gulling). Objective  Identify, challenge, and replace biased, fearful self-talk with positive, realistic, and empowering self-talk. Target Date: 2023-01-14 Frequency: Daily  Progress: 50 Modality: individual  Related Interventions  Explore the client's schema and self-talk that mediate his/her fear response; assist him/her in challenging the biases; replace the distorted messages with reality-based alternatives and positive, realistic self-talk that will increase his/her self-confidence in coping with irrational fears (see Cognitive Therapy of Anxiety Disorders by Alison Stalling). 3. difficulty w/ being assertive and setting boundaries  Objective  Increase the frequency of assertive behaviors and setting healthy boundaries Target Date: 2023-01-14 Frequency: Daily  Progress: 0 Modality: individual  Related Interventions  Teach assertive communication and healthy boundaries and ways to impelent     Conditions For Discharge  Achievement of treatment goals and objectives   Pt participated in tx plan and provided verbal consent.      Jan Fireman,  [facility]

## 2022-04-26 ENCOUNTER — Encounter: Payer: 59 | Admitting: Psychology

## 2022-04-26 NOTE — Progress Notes (Signed)
                Delisha Peaden, LCMHC This encounter was created in error - please disregard. 

## 2022-04-27 ENCOUNTER — Ambulatory Visit: Payer: 59 | Admitting: Cardiology

## 2022-04-28 ENCOUNTER — Encounter (HOSPITAL_COMMUNITY): Payer: Self-pay

## 2022-04-28 ENCOUNTER — Ambulatory Visit (HOSPITAL_COMMUNITY)
Admission: EM | Admit: 2022-04-28 | Discharge: 2022-04-28 | Disposition: A | Discharge status: Home / Self Care | Payer: 59 | Attending: Family Medicine | Admitting: Family Medicine

## 2022-04-28 DIAGNOSIS — J029 Acute pharyngitis, unspecified: Secondary | ICD-10-CM

## 2022-04-28 DIAGNOSIS — J069 Acute upper respiratory infection, unspecified: Secondary | ICD-10-CM

## 2022-04-28 DIAGNOSIS — Z20822 Contact with and (suspected) exposure to covid-19: Secondary | ICD-10-CM | POA: Insufficient documentation

## 2022-04-28 LAB — POCT RAPID STREP A, ED / UC: Streptococcus, Group A Screen (Direct): NEGATIVE

## 2022-04-28 NOTE — ED Triage Notes (Signed)
Patient states she has been having nasal congestion and sore throat onset yesterday. States her brother is also sick but does not know who was sick first.

## 2022-04-28 NOTE — ED Provider Notes (Signed)
[facility]    CSN: [id number] Arrival date & time: 04/28/22  1314      History   Chief Complaint Chief Complaint  Patient presents with   Sore Throat   Nasal Congestion    HPI Victoria [name] is a 26 y.o. female.    Sore Throat   Here for sore throat and mild nasal congestion that began about 2 days ago.  She has had minimal cough.  Also occasional ear pain  No fever or chills.  No vomiting or diarrhea.  Her menstrual cycle started in the last day or 2.  Past Medical History:  Diagnosis Date   Abnormal Papanicolaou smear of cervix with positive human papilloma virus (HPV) test 09/08/2020   Pap was LSIL +HPV:  Needs colpo per ASCCP guidelines,  CIN 3+ immediate risk is 4.3%______________   Bipolar disorder (HCC)    Mitral valve prolapse    2D echo 09/2021 showed mild anterior mitral valve prolapse with mild MR   POTS (postural orthostatic tachycardia syndrome)     Patient Active Problem List   Diagnosis Date Noted   Encounter for general adult medical examination with abnormal findings 03/12/2022   Numbness and tingling of hand 11/24/2021   Tremors of nervous system 11/24/2021   Mitral valve prolapse 09/11/2021   Chest pain 06/22/2021   Anxiety with depression 06/22/2021   Chronic nonintractable headache 06/22/2021   Abnormal Pap smear of cervix 09/08/2020   Nexplanon in place 08/28/2020   Major depressive disorder, recurrent, severe without psychotic features (HCC) 07/01/2018   Generalized anxiety disorder 07/01/2018   Moderate episode of recurrent major depressive disorder (HCC) 04/11/2018   Postural orthostatic tachycardia syndrome 08/27/2011    Past Surgical History:  Procedure Laterality Date   EXTRACORPOREAL CIRCULATION     LASER ABLATION CONDOLAMATA N/A 10/24/2020   Procedure: LASER ABLATION of the cervix;  Surgeon: Lazaro Arms, MD;  Location: [facility];  Service: Gynecology;  Laterality: N/A;    OB History     Gravida  0   Para  0    Term  0   Preterm  0   AB  0   Living  0      SAB  0   IAB  0   Ectopic  0   Multiple  0   Live Births  0            Home Medications    Prior to Admission medications   Medication Sig Start Date End Date Taking? Authorizing Provider  etonogestrel (NEXPLANON) 68 MG IMPL implant 1 each by Subdermal route once.   Yes [provider]  fludrocortisone (FLORINEF) 0.1 MG tablet TAKE 1 TABLET BY MOUTH TWICE A DAY 03/29/22  Yes Patel, Earlie Lou, MD  fluticasone (FLONASE) 50 MCG/ACT nasal spray Place 1 spray into both nostrils daily. 10/13/21   Coralyn Mark, NP  hydrOXYzine (VISTARIL) 25 MG capsule Take 1 capsule (25 mg total) by mouth every 8 (eight) hours as needed. 09/21/21   Anabel Halon, MD  pantoprazole (PROTONIX) 20 MG tablet Take 1 tablet (20 mg total) by mouth daily. 10/28/21   Teressa Lower, PA-C  SUMAtriptan (IMITREX) 50 MG tablet Take 1 tablet (50 mg total) by mouth every 2 (two) hours as needed for migraine. May repeat in 2 hours if headache persists or recurs. 03/30/22   Anabel Halon, MD    Family History Family History  Problem Relation Age of Onset  Diabetes Paternal Grandfather    Colon cancer Maternal Grandfather     Social History Social History   Tobacco Use   Smoking status: Never   Smokeless tobacco: Never  Vaping Use   Vaping Use: Former  Substance Use Topics   Alcohol use: Yes    Comment: occ   Drug use: No     Allergies   Patient has no known allergies.   Review of Systems Review of Systems   Physical Exam Triage Vital Signs ED Triage Vitals  Enc Vitals Group     BP 04/28/22 1345 95/61     Pulse Rate 04/28/22 1345 67     Resp --      Temp 04/28/22 1345 (!) 97.1 F (36.2 C)     Temp Source 04/28/22 1345 Oral     SpO2 04/28/22 1345 98 %     Weight 04/28/22 1348 165 lb (74.8 kg)     Height 04/28/22 1348 5\' 4"  (1.626 m)     Head Circumference --      Peak Flow --      Pain Score 04/28/22 1348 5      Pain Loc --      Pain Edu? --      Excl. in GC? --    No data found.  Updated Vital Signs BP 95/61 (BP Location: Right Arm)   Pulse 67   Temp (!) 97.1 F (36.2 C) (Oral)   Ht 5\' 4"  (1.626 m)   Wt 74.8 kg   LMP 04/23/2022 (Within Days)   SpO2 98%   BMI 28.32 kg/m   Visual Acuity Right Eye Distance:   Left Eye Distance:   Bilateral Distance:    Right Eye Near:   Left Eye Near:    Bilateral Near:     Physical Exam   UC Treatments / Results  Labs (all labs ordered are listed, but only abnormal results are displayed) Labs Reviewed  CULTURE, GROUP A STREP ([facility])  SARS CORONAVIRUS 2 (TAT 6-24 HRS)  POCT RAPID STREP A, ED / UC    EKG   Radiology No results found.  Procedures Procedures (including critical care time)  Medications Ordered in UC Medications - No data to display  Initial Impression / Assessment and Plan / UC Course  I have reviewed the triage vital signs and the nursing notes.  Pertinent labs & imaging results that were available during my care of the patient were reviewed by me and considered in my medical decision making (see chart for details).     Rapid strep is negative and culture will be sent.  Staff will call and treat per protocol if positive on the culture. Also we will swab for COVID so that she knows if she needs to quarantine. Final Clinical Impressions(s) / UC Diagnoses   Final diagnoses:  Acute pharyngitis, unspecified etiology  Viral upper respiratory tract infection     Discharge Instructions      Your strep test is negative.  Culture of the throat will be sent, and staff will notify you if that is in turn positive.   You have been swabbed for COVID, and the test will result in the next 24 hours. Our staff will call you if positive. If the test is positive, you should quarantine for 5 days.      ED Prescriptions   None    PDMP not reviewed this encounter.   , MD 04/28/22 [phone]

## 2022-04-28 NOTE — Discharge Instructions (Addendum)
Your strep test is negative.  Culture of the throat will be sent, and staff will notify you if that is in turn positive.   You have been swabbed for COVID, and the test will result in the next 24 hours. Our staff will call you if positive. If the test is positive, you should quarantine for 5 days.

## 2022-04-29 LAB — SARS CORONAVIRUS 2 (TAT 6-24 HRS): SARS Coronavirus 2: NEGATIVE

## 2022-04-30 LAB — CULTURE, GROUP A STREP (THRC)

## 2022-05-01 ENCOUNTER — Ambulatory Visit (HOSPITAL_COMMUNITY)
Admission: EM | Admit: 2022-05-01 | Discharge: 2022-05-01 | Disposition: A | Discharge status: Home / Self Care | Payer: 59 | Attending: Emergency Medicine | Admitting: Emergency Medicine

## 2022-05-01 ENCOUNTER — Encounter (HOSPITAL_COMMUNITY): Payer: Self-pay | Admitting: Emergency Medicine

## 2022-05-01 DIAGNOSIS — J069 Acute upper respiratory infection, unspecified: Secondary | ICD-10-CM

## 2022-05-01 MED ORDER — PROMETHAZINE-DM 6.25-15 MG/5ML PO SYRP: 5.0000 mL | ORAL_SOLUTION | Freq: Four times a day (QID) | ORAL | 0 refills | Status: DC | PRN

## 2022-05-01 MED ORDER — BENZONATATE 100 MG PO CAPS: 100.0000 mg | ORAL_CAPSULE | Freq: Three times a day (TID) | ORAL | 0 refills | Status: DC

## 2022-05-01 MED ORDER — AMOXICILLIN 500 MG PO CAPS: 500.0000 mg | ORAL_CAPSULE | Freq: Two times a day (BID) | ORAL | 0 refills | Status: AC

## 2022-05-01 NOTE — Discharge Instructions (Signed)
Your symptoms today are most likely being caused by a virus and should steadily improve in time it can take up to 7 to 10 days before you truly start to see a turnaround however things will get better  You may use Tessalon pill every 8 hours as needed to help calm your coughing  You may use cough syrup every 6 hours for additional comfort, be mindful this will make you drowsy, if this occurs you may use half a dose or use at bedtime  If your symptoms have still persisted for 9 to 10 days without any form of resolution you may begin use of amoxicillin every morning and every evening for 10 days, this medicine will be available to you on Wednesday   you can take Tylenol and/or Ibuprofen as needed for fever reduction and pain relief.   For cough: honey 1/2 to 1 teaspoon (you can dilute the honey in water or another fluid).  You can also use guaifenesin and dextromethorphan for cough. You can use a humidifier for chest congestion and cough.  If you don't have a humidifier, you can sit in the bathroom with the hot shower running.      For sore throat: try warm salt water gargles, cepacol lozenges, throat spray, warm tea or water with lemon/honey, popsicles or ice, or OTC cold relief medicine for throat discomfort.   For congestion: take a daily anti-histamine like Zyrtec, Claritin, and a oral decongestant, such as pseudoephedrine.  You can also use Flonase 1-2 sprays in each nostril daily.   It is important to stay hydrated: drink plenty of fluids (water, gatorade/powerade/pedialyte, juices, or teas) to keep your throat moisturized and help further relieve irritation/discomfort.

## 2022-05-01 NOTE — ED Triage Notes (Signed)
Upper respiratory symptoms starting Tuesday  this past week. Was seen and evaluated on Wednesday, covid, rapid strep, and culture all negative. Reports she's feeling worse today.

## 2022-05-01 NOTE — ED Provider Notes (Signed)
[facility]    CSN: [id number] Arrival date & time: 05/01/22  1629      History   Chief Complaint Chief Complaint  Patient presents with   Cough    HPI Victoria [name] is a 26 y.o. female.   Patient presents with a sore throat, nasal congestion, nonproductive dry cough, body aches and some bilateral ear pain for 5 days.  Decreased appetite but tolerating food and liquids.  No known sick contacts.  Has attempted use of DayQuil and NyQuil which have been minimally effective.  Was evaluated in urgent care 3 days ago where COVID strep and flu testing were negative.  Endorses that she feels that symptoms are slightly worsening as new symptoms are beginning.  History of a mitral valve prolapse and POTS. ' Past Medical History:  Diagnosis Date   Abnormal Papanicolaou smear of cervix with positive human papilloma virus (HPV) test 09/08/2020   Pap was LSIL +HPV:  Needs colpo per [facility] guidelines,  CIN 3+ immediate risk is 4.3%______________   Bipolar disorder (HCC)    Mitral valve prolapse    2D echo 09/2021 showed mild anterior mitral valve prolapse with mild MR   POTS (postural orthostatic tachycardia syndrome)     Patient Active Problem List   Diagnosis Date Noted   Encounter for general adult medical examination with abnormal findings 03/12/2022   Numbness and tingling of hand 11/24/2021   Tremors of nervous system 11/24/2021   Mitral valve prolapse 09/11/2021   Chest pain 06/22/2021   Anxiety with depression 06/22/2021   Chronic nonintractable headache 06/22/2021   Abnormal Pap smear of cervix 09/08/2020   Nexplanon in place 08/28/2020   Major depressive disorder, recurrent, severe without psychotic features (HCC) 07/01/2018   Generalized anxiety disorder 07/01/2018   Moderate episode of recurrent major depressive disorder (HCC) 04/11/2018   Postural orthostatic tachycardia syndrome 08/27/2011    Past Surgical History:  Procedure Laterality Date    EXTRACORPOREAL CIRCULATION     LASER ABLATION CONDOLAMATA N/A 10/24/2020   Procedure: LASER ABLATION of the cervix;  Surgeon: Lazaro Arms, MD;  Location: [facility];  Service: Gynecology;  Laterality: N/A;    OB History     Gravida  0   Para  0   Term  0   Preterm  0   AB  0   Living  0      SAB  0   IAB  0   Ectopic  0   Multiple  0   Live Births  0            Home Medications    Prior to Admission medications   Medication Sig Start Date End Date Taking? Authorizing Provider  etonogestrel (NEXPLANON) 68 MG IMPL implant 1 each by Subdermal route once.    [provider]  fludrocortisone (FLORINEF) 0.1 MG tablet TAKE 1 TABLET BY MOUTH TWICE A DAY 03/29/22   Anabel Halon, MD  fluticasone (FLONASE) 50 MCG/ACT nasal spray Place 1 spray into both nostrils daily. 10/13/21   Coralyn Mark, NP  hydrOXYzine (VISTARIL) 25 MG capsule Take 1 capsule (25 mg total) by mouth every 8 (eight) hours as needed. 09/21/21   Anabel Halon, MD  pantoprazole (PROTONIX) 20 MG tablet Take 1 tablet (20 mg total) by mouth daily. 10/28/21   Teressa Lower, PA-C  SUMAtriptan (IMITREX) 50 MG tablet Take 1 tablet (50 mg total) by mouth every 2 (two) hours as needed for migraine.  May repeat in 2 hours if headache persists or recurs. 03/30/22   Lindell Spar, MD    Family History Family History  Problem Relation Age of Onset   Diabetes Paternal Grandfather    Colon cancer Maternal Grandfather     Social History Social History   Tobacco Use   Smoking status: Never   Smokeless tobacco: Never  Vaping Use   Vaping Use: Former  Substance Use Topics   Alcohol use: Yes    Comment: occ   Drug use: No     Allergies   Patient has no known allergies.   Review of Systems Review of Systems  Constitutional: Negative.   HENT:  Positive for congestion, rhinorrhea and sore throat. Negative for dental problem, drooling, ear discharge, ear pain, facial swelling, hearing  loss, mouth sores, nosebleeds, postnasal drip, sinus pressure, sinus pain, sneezing, tinnitus, trouble swallowing and voice change.   Respiratory:  Positive for cough. Negative for apnea, choking, chest tightness, shortness of breath, wheezing and stridor.   Cardiovascular: Negative.   Gastrointestinal: Negative.   Musculoskeletal:  Positive for myalgias. Negative for arthralgias, back pain, gait problem, joint swelling, neck pain and neck stiffness.  Skin: Negative.      Physical Exam Triage Vital Signs ED Triage Vitals  Enc Vitals Group     BP 05/01/22 1646 101/67     Pulse Rate 05/01/22 1646 99     Resp 05/01/22 1646 16     Temp 05/01/22 1646 98.8 F (37.1 C)     Temp Source 05/01/22 1646 Oral     SpO2 05/01/22 1646 96 %     Weight --      Height --      Head Circumference --      Peak Flow --      Pain Score 05/01/22 1647 0     Pain Loc --      Pain Edu? --      Excl. in [place]? --    No data found.  Updated Vital Signs BP 101/67 (BP Location: Right Arm)   Pulse 99   Temp 98.8 F (37.1 C) (Oral)   Resp 16   LMP 04/23/2022 (Within Days)   SpO2 96%   Visual Acuity Right Eye Distance:   Left Eye Distance:   Bilateral Distance:    Right Eye Near:   Left Eye Near:    Bilateral Near:     Physical Exam Constitutional:      Appearance: Normal appearance.  HENT:     Head: Normocephalic.     Right Ear: Hearing, tympanic membrane, ear canal and external ear normal.     Left Ear: Hearing, tympanic membrane, ear canal and external ear normal.     Nose: Congestion and rhinorrhea present.     Mouth/Throat:     Pharynx: Oropharynx is clear. No posterior oropharyngeal erythema.     Tonsils: No tonsillar exudate. 0 on the right. 0 on the left.  Eyes:     Extraocular Movements: Extraocular movements intact.  Cardiovascular:     Rate and Rhythm: Normal rate and regular rhythm.     Pulses: Normal pulses.     Heart sounds: Normal heart sounds.  Pulmonary:     Effort:  Pulmonary effort is normal.     Breath sounds: Normal breath sounds.  Musculoskeletal:     Cervical back: Normal range of motion and neck supple.  Skin:    General: Skin is warm and dry.  Neurological:  Mental Status: She is alert and oriented to person, place, and time. Mental status is at baseline.  Psychiatric:        Mood and Affect: Mood normal.        Behavior: Behavior normal.      UC Treatments / Results  Labs (all labs ordered are listed, but only abnormal results are displayed) Labs Reviewed - No data to display  EKG   Radiology No results found.  Procedures Procedures (including critical care time)  Medications Ordered in UC Medications - No data to display  Initial Impression / Assessment and Plan / UC Course  I have reviewed the triage vital signs and the nursing notes.  Pertinent labs & imaging results that were available during my care of the patient were reviewed by me and considered in my medical decision making (see chart for details).  Viral URI with cough  Vital signs are stable and patient is in no signs of distress, O2 saturation 96% on room air and lungs are clear to auscultation, low suspicion for pneumonia or bronchitis and will defer imaging today, etiology is most likely viral, discussed with patient, Promethazine DM and Tessalon sent to pharmacy as cough is most worrisome symptom, watch wait antibiotic placed at pharmacy for day 10 of illness to begin use if no symptoms have begun to resolve, may follow-up with his urgent care as needed if symptoms persist or worsen, work note given Final Clinical Impressions(s) / UC Diagnoses   Final diagnoses:  None   Discharge Instructions   None    ED Prescriptions   None    PDMP not reviewed this encounter.   Valinda Hoar, NP 05/01/22 1710

## 2022-05-03 ENCOUNTER — Ambulatory Visit (INDEPENDENT_AMBULATORY_CARE_PROVIDER_SITE_OTHER): Payer: 59 | Admitting: Psychology

## 2022-05-03 DIAGNOSIS — F319 Bipolar disorder, unspecified: Secondary | ICD-10-CM

## 2022-05-03 DIAGNOSIS — R69 Illness, unspecified: Secondary | ICD-10-CM | POA: Diagnosis not present

## 2022-05-03 NOTE — Progress Notes (Addendum)
[facility] Counselor/Therapist Progress Note  Patient ID: STARLINA LAPRE, MRN: [id number],    Date: 05/03/2022  Time Spent: 11:02am-11:46am   Treatment Type: Individual Therapy  pt is seen for a virtual video visit via caregility.  Pt joins from her home and counselor from her home office.   Reported Symptoms:  stressors/frustation w/ housing/roommate, asserting boundaries  Mental Status Exam: Appearance:  Well Groomed     Behavior: Appropriate  Motor: Normal  Speech/Language:  Normal Rate  Affect: Appropriate  Mood: normal  Thought process: normal  Thought content:   WNL  Sensory/Perceptual disturbances:   WNL  Orientation: oriented to person, place, time/date, and situation  Attention: Good  Concentration: Good  Memory: WNL  Fund of knowledge:  Good  Insight:   Good  Judgment:  Good  Impulse Control: Good   Risk Assessment: Danger to Self:  No Self-injurious Behavior: No Danger to Others: No Duty to Warn:no Physical Aggression / Violence:No  Access to Firearms a concern: No  Gang Involvement:No   Subjective: Counselor assessed pt current functioning per pt report.  Processed w/ stressors and and recent conflict.  Reflected pt setting boundaries and asserting for self.  Discussed process of continued boundary setting to maintain.  Discussed stressors w/ being sick and not being able to work.  Discussed focus on self care and focus on what is in her control.  Pt affect wnl, presents sick and reports has been dx w/ cold past week.  Pt reported has set back financially as unable to work.  Pt discussed conflict w/ brother when blamed for being unfair for setting boundaries w/ roommate/stepbrother.  Pt was able to assert her self and her boundaries w/ roommate/brother and discussed not feeling guilty about or taking on blame from others.  Pt reports that focused on not dwelling on stressors.     Interventions: Cognitive Behavioral Therapy,  Assertiveness/Communication, and positive self talk.   Diagnosis:Bipolar I disorder ([place])  Plan: f/u w/ pt in 2 weeks for counseling to assist coping w/ stress, anxiety and mood stability.  Pt to f/u as scheduled w/ neurology and pt f/u as scheduled w/ PCP.   Treatment Plan  01/13/22  Client Abilities/Strengths   supports: my maternal uncle and his friend [name] and my best friend- who I met in college. pt enjoys her dogs, reading/journaling. physical therapy has helped /w my increased physical activity. advocating for herself.  Client Treatment Preferences   biweekly counseling to start. pt to continue w/ PCP to manage meds. continue to f/u w/ neurologist.  Client Statement of Needs   Pt "some ways I'm self-aware and other ways I'm not. hard to decipher why I do things certain ways. I want to continue to work through understanding patterns that I get stuck in and change those. I have trouble setting up boundaries and saying no to things and that causes issues. work on being assertive."  Treatment Level   outpatient counseling  Symptoms   Depressed or irritable mood.: improving Difficulty being assertive:  maintained.  History of at least one hypomanic, manic, or mixed mood episode. Improved Hypervigilance (e.g., feeling constantly on edge, experiencing concentration difficulties, having trouble falling or staying asleep, exhibiting a general state of irritability). Improving Lack of energy. maintained.  Motor tension (e.g., restlessness, tiredness, shakiness, muscle tension). Improving Sleeplessness or hypersomnia. improved  Problems Addressed   Bipolar Disorder - Depression, Anxiety, Assertiveness Goals  1. Develop healthy cognitive patterns and beliefs about self and  the world that lead to alleviation and help prevent the relapse of mood episodes.  Objective  Identify and replace thoughts and behaviors that trigger manic or depressive symptoms. Target Date: 2023-01-14  Frequency: Daily  Progress: 30 Modality: individual  Related Interventions  Use cognitive therapy techniques to explore and educate the client about cognitive biases that trigger his/her elevated or depressive mood (see Cognitive Therapy for Bipolar Disorder by Lanny Hurst al.). 2. Enhance ability to effectively cope with the full variety of life's worries and anxieties.  Objective  Learn and implement problem-solving strategies for realistically addressing worries. Target Date: 2023-01-14 Frequency: Daily  Progress: 50 Modality: individual  Related Interventions  Teach the client problem-solving strategies involving specifically defining a problem, generating options for addressing it, evaluating the pros and cons of each option, selecting and implementing an optional action, and reevaluating and refining the action (or assign "Applying Problem-Solving to Interpersonal Conflict" in the Adult Psychotherapy Homework Planner by Bryn Gulling). Objective  Identify, challenge, and replace biased, fearful self-talk with positive, realistic, and empowering self-talk. Target Date: 2023-01-14 Frequency: Daily  Progress: 50 Modality: individual  Related Interventions  Explore the client's schema and self-talk that mediate his/her fear response; assist him/her in challenging the biases; replace the distorted messages with reality-based alternatives and positive, realistic self-talk that will increase his/her self-confidence in coping with irrational fears (see Cognitive Therapy of Anxiety Disorders by Alison Stalling). 3. difficulty w/ being assertive and setting boundaries  Objective  Increase the frequency of assertive behaviors and setting healthy boundaries Target Date: 2023-01-14 Frequency: Daily  Progress: 0 Modality: individual  Related Interventions  Teach assertive communication and healthy boundaries and ways to impelent     Conditions For Discharge  Achievement of treatment goals and  objectives   Pt participated in tx plan and provided verbal consent.      Jan Fireman [facility]               [place], [facility]

## 2022-05-17 ENCOUNTER — Ambulatory Visit (INDEPENDENT_AMBULATORY_CARE_PROVIDER_SITE_OTHER): Payer: Self-pay | Admitting: Psychology

## 2022-05-17 DIAGNOSIS — F319 Bipolar disorder, unspecified: Secondary | ICD-10-CM

## 2022-05-17 NOTE — Progress Notes (Signed)
[facility] Counselor/Therapist Progress Note  Patient ID: Victoria [name], MRN: [id number],    Date: 05/17/2022  Time Spent: 10:17am-10:50am   Treatment Type: Individual Therapy  pt is seen for a virtual video visit via caregility.  Pt joins from her home and counselor from her office.   Reported Symptoms:  stress/anxiety, coping well w/ self care and what's in her control  Mental Status Exam: Appearance:  Well Groomed     Behavior: Appropriate  Motor: Normal  Speech/Language:  Normal Rate  Affect: Appropriate  Mood: normal  Thought process: normal  Thought content:   WNL  Sensory/Perceptual disturbances:   WNL  Orientation: oriented to person, place, time/date, and situation  Attention: Good  Concentration: Good  Memory: WNL  Fund of knowledge:  Good  Insight:   Good  Judgment:  Good  Impulse Control: Good   Risk Assessment: Danger to Self:  No Self-injurious Behavior: No Danger to Others: No Duty to Warn:no Physical Aggression / Violence:No  Access to Firearms a concern: No  Gang Involvement:No   Subjective: Counselor assessed pt current functioning per pt report.  Processed w/ stressors and and preparing for court today w/ divorce proceedings.   Reflected positive coping skills using, validated and normalized emotions w/ stressors.  Discussed supports utilizing.  Pt affect wnl.  Pt reported less sleep over weekend w/ supporting family.  Pt reported on stress of court today and recognizing more stressed than realized going into weekend.  Pt discussed self care and focus on one thing at time and seeking support. Interventions: Cognitive Behavioral Therapy, Assertiveness/Communication, and positive self talk.   Diagnosis:Bipolar I disorder ([place])  Plan: f/u w/ pt in 2 weeks for counseling to assist coping w/ stress, anxiety and mood stability.  Pt to f/u as scheduled w/ neurology and pt f/u as scheduled w/ PCP.   Treatment Plan  01/13/22  Client  Abilities/Strengths   supports: my maternal uncle and his friend [name] and my best friend- who I met in college. pt enjoys her dogs, reading/journaling. physical therapy has helped /w my increased physical activity. advocating for herself.  Client Treatment Preferences   biweekly counseling to start. pt to continue w/ PCP to manage meds. continue to f/u w/ neurologist.  Client Statement of Needs   Pt "some ways I'm self-aware and other ways I'm not. hard to decipher why I do things certain ways. I want to continue to work through understanding patterns that I get stuck in and change those. I have trouble setting up boundaries and saying no to things and that causes issues. work on being assertive."  Treatment Level   outpatient counseling  Symptoms   Depressed or irritable mood.: improving Difficulty being assertive:  maintained.  History of at least one hypomanic, manic, or mixed mood episode. Improved Hypervigilance (e.g., feeling constantly on edge, experiencing concentration difficulties, having trouble falling or staying asleep, exhibiting a general state of irritability). Improving Lack of energy. maintained.  Motor tension (e.g., restlessness, tiredness, shakiness, muscle tension). Improving Sleeplessness or hypersomnia. improved  Problems Addressed   Bipolar Disorder - Depression, Anxiety, Assertiveness Goals  1. Develop healthy cognitive patterns and beliefs about self and the world that lead to alleviation and help prevent the relapse of mood episodes.  Objective  Identify and replace thoughts and behaviors that trigger manic or depressive symptoms. Target Date: 2023-01-14 Frequency: Daily  Progress: 30 Modality: individual  Related Interventions  Use cognitive therapy techniques to explore and educate the  client about cognitive biases that trigger his/her elevated or depressive mood (see Cognitive Therapy for Bipolar Disorder by Lanny Hurst al.). 2. Enhance ability to  effectively cope with the full variety of life's worries and anxieties.  Objective  Learn and implement problem-solving strategies for realistically addressing worries. Target Date: 2023-01-14 Frequency: Daily  Progress: 50 Modality: individual  Related Interventions  Teach the client problem-solving strategies involving specifically defining a problem, generating options for addressing it, evaluating the pros and cons of each option, selecting and implementing an optional action, and reevaluating and refining the action (or assign "Applying Problem-Solving to Interpersonal Conflict" in the Adult Psychotherapy Homework Planner by Bryn Gulling). Objective  Identify, challenge, and replace biased, fearful self-talk with positive, realistic, and empowering self-talk. Target Date: 2023-01-14 Frequency: Daily  Progress: 50 Modality: individual  Related Interventions  Explore the client's schema and self-talk that mediate his/her fear response; assist him/her in challenging the biases; replace the distorted messages with reality-based alternatives and positive, realistic self-talk that will increase his/her self-confidence in coping with irrational fears (see Cognitive Therapy of Anxiety Disorders by Alison Stalling). 3. difficulty w/ being assertive and setting boundaries  Objective  Increase the frequency of assertive behaviors and setting healthy boundaries Target Date: 2023-01-14 Frequency: Daily  Progress: 0 Modality: individual  Related Interventions  Teach assertive communication and healthy boundaries and ways to impelent     Conditions For Discharge  Achievement of treatment goals and objectives   Pt participated in tx plan and provided verbal consent.      Jan Fireman, [facility]

## 2022-05-26 ENCOUNTER — Ambulatory Visit (INDEPENDENT_AMBULATORY_CARE_PROVIDER_SITE_OTHER): Payer: Self-pay | Admitting: Medical

## 2022-05-26 ENCOUNTER — Encounter: Payer: Self-pay | Admitting: Medical

## 2022-05-26 ENCOUNTER — Other Ambulatory Visit (HOSPITAL_COMMUNITY)
Admission: RE | Admit: 2022-05-26 | Discharge: 2022-05-26 | Disposition: A | Discharge status: Home / Self Care | Payer: Self-pay | Source: Ambulatory Visit | Attending: Medical | Admitting: Medical

## 2022-05-26 VITALS — BP 114/67 | HR 70 | Ht 64.0 in | Wt 176.0 lb

## 2022-05-26 DIAGNOSIS — Z3009 Encounter for other general counseling and advice on contraception: Secondary | ICD-10-CM

## 2022-05-26 DIAGNOSIS — Z01419 Encounter for gynecological examination (general) (routine) without abnormal findings: Secondary | ICD-10-CM | POA: Insufficient documentation

## 2022-05-26 NOTE — Progress Notes (Signed)
History:  [name] is a 26 y.o. [id number] who presents to clinic today for annual exam with pap smear. The patient has a history of ASCUS pap with +HPV 04/2021 and colpo showing CIN 1. She has nexplanon in place but is not currently sexually active. She states regular periods and some intermenstrual bleeding with the Nexplanon that has picked up some recently.  She denies abnormal discharge, pain, UTI symptoms, breast concerns or any GYN concerns today. She is bleeding today.   The following portions of the patient's history were reviewed and updated as appropriate: allergies, current medications, family history, past medical history, social history, past surgical history and problem list.  Review of Systems:  Review of Systems  Constitutional:  Negative for fever and malaise/fatigue.  Gastrointestinal:  Negative for abdominal pain.  Genitourinary:  Negative for dysuria, frequency and urgency.       Neg - vaginal bleeding, discharge, pelvic pain      Objective:  Physical Exam BP 114/67 (BP Location: Right Arm, Patient Position: Sitting, Cuff Size: Normal)   Pulse 70   Ht 5\' 4"  (1.626 m)   Wt 176 lb (79.8 kg)   LMP 04/23/2022 (Within Days)   BMI 30.21 kg/m  Physical Exam Vitals and nursing note reviewed. Exam conducted with a chaperone present.  Constitutional:      General: She is not in acute distress.    Appearance: Normal appearance. She is well-developed and normal weight.  HENT:     Head: Normocephalic and atraumatic.  Cardiovascular:     Rate and Rhythm: Normal rate and regular rhythm.     Heart sounds: No murmur heard. Pulmonary:     Effort: Pulmonary effort is normal. No respiratory distress.     Breath sounds: Normal breath sounds. No wheezing.  Chest:  Breasts:    Right: Normal.     Left: Normal.  Abdominal:     General: Abdomen is flat. Bowel sounds are normal. There is no distension.     Palpations: Abdomen is soft. There is no mass.     Tenderness:  There is no abdominal tenderness. There is no guarding or rebound.  Genitourinary:    General: Normal vulva.     Vagina: Bleeding (small) present. No vaginal discharge, erythema or tenderness.     Cervix: No cervical motion tenderness, discharge, friability, lesion, erythema or cervical bleeding.     Uterus: Not enlarged and not tender.      Adnexa:        Right: No mass or tenderness.         Left: No mass or tenderness.    Musculoskeletal:     Cervical back: Neck supple.  Skin:    General: Skin is warm and dry.     Findings: No erythema.  Neurological:     Mental Status: She is alert and oriented to person, place, and time.  Psychiatric:        Mood and Affect: Mood normal.     Health Maintenance Due  Topic Date Due   Hepatitis C Screening  Never done   COVID-19 Vaccine (3 - Moderna series) 04/14/2021   INFLUENZA VACCINE  05/11/2022    Labs, imaging and previous visits in Epic reviewed  Assessment & Plan:  1. Encounter for gynecological examination with Papanicolaou smear of cervix - Cytology - PAP( [facility]) - Declined STD testing today other than HPV   2. Birth Control Counseling  - Discussed all options for birth control  including vaginal gel, OCPs, ring, patch, implant, injection and IUD. Patient will consider and schedule when ready to change or when Nexplanon expires.    Marny Lowenstein, PA-C 05/26/2022 9:10 AM

## 2022-05-27 ENCOUNTER — Other Ambulatory Visit: Payer: Self-pay | Admitting: Internal Medicine

## 2022-05-27 DIAGNOSIS — G90A Postural orthostatic tachycardia syndrome (POTS): Secondary | ICD-10-CM

## 2022-05-31 ENCOUNTER — Ambulatory Visit (INDEPENDENT_AMBULATORY_CARE_PROVIDER_SITE_OTHER): Payer: Self-pay | Admitting: Psychology

## 2022-05-31 DIAGNOSIS — F319 Bipolar disorder, unspecified: Secondary | ICD-10-CM

## 2022-05-31 LAB — CYTOLOGY - PAP
Comment: NEGATIVE
Diagnosis: NEGATIVE
Diagnosis: REACTIVE
High risk HPV: NEGATIVE
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
## 2022-06-10 ENCOUNTER — Other Ambulatory Visit: Payer: Self-pay | Admitting: Internal Medicine

## 2022-06-10 DIAGNOSIS — G90A Postural orthostatic tachycardia syndrome (POTS): Secondary | ICD-10-CM

## 2022-06-15 ENCOUNTER — Ambulatory Visit (INDEPENDENT_AMBULATORY_CARE_PROVIDER_SITE_OTHER): Payer: Self-pay | Admitting: Psychology

## 2022-06-15 DIAGNOSIS — F319 Bipolar disorder, unspecified: Secondary | ICD-10-CM

## 2022-06-15 NOTE — Progress Notes (Signed)
[facility] Counselor/Therapist Progress Note  Patient ID: Victoria [name], MRN: [id number],    Date: 06/15/2022  Time Spent: 9:02am-9:38am   Treatment Type: Individual Therapy  pt is seen for a virtual video visit via caregility.  Pt joins from her home and counselor from her home office.   Reported Symptoms: financial stress and worry  Mental Status Exam: Appearance:  Well Groomed     Behavior: Appropriate  Motor: Normal  Speech/Language:  Normal Rate  Affect: Appropriate  Mood: worry  Thought process: normal  Thought content:   WNL  Sensory/Perceptual disturbances:   WNL  Orientation: oriented to person, place, time/date, and situation  Attention: Good  Concentration: Good  Memory: WNL  Fund of knowledge:  Good  Insight:   Good  Judgment:  Good  Impulse Control: Good   Risk Assessment: Danger to Self:  No Self-injurious Behavior: No Danger to Others: No Duty to Warn:no Physical Aggression / Violence:No  Access to Firearms a concern: No  Gang Involvement:No   Subjective: Counselor assessed pt current functioning per pt report.  Processed w/ financial  stressors and impact having. Explored w/ pt steps she is taking to managing and reflecting positives steps.  Encouraged to f/u w/ uncle and communicate needs.  Discussed tx and balancing tx needs and financial needs. Pt affect wnl.  Pt reported she has been tired, not feeling well w/ her medical concerns but feels impacted w/ stress.  Pt reported her biggest stressor is financial- behind on bills and although working 6 days a week at [employer] not making enough.  Pt discussed steps she is taking to look for another job w/ increase wage/consistency.   Pt decided that would be helpful to be transparent w/ uncle who is landlord re: payment of rent.  Pt reported that she is about to get insurance and wants to keep her counseling appointments as scheduled.  Pt reports she will f/u w/ pt accounting re: payment  plan.  Interventions: Cognitive Behavioral Therapy, Assertiveness/Communication, and positive self talk.   Diagnosis:Bipolar I disorder ([place])  Plan: f/u w/ pt in 2 weeks for counseling to assist coping w/ stress, anxiety and mood stability.  Pt to f/u as scheduled w/ neurology and pt f/u as scheduled w/ PCP.   Encouraged to follow up w/ [facility] pt accounting for to explore options since currently uninsured and explore payment plan options.  Treatment Plan  01/13/22  Client Abilities/Strengths   supports: my maternal uncle and his friend Victoria [name] and my best friend- who I met in college. pt enjoys her dogs, reading/journaling. physical therapy has helped /w my increased physical activity. advocating for herself.  Client Treatment Preferences   biweekly counseling to start. pt to continue w/ PCP to manage meds. continue to f/u w/ neurologist.  Client Statement of Needs   Pt "some ways I'm self-aware and other ways I'm not. hard to decipher why I do things certain ways. I want to continue to work through understanding patterns that I get stuck in and change those. I have trouble setting up boundaries and saying no to things and that causes issues. work on being assertive."  Treatment Level   outpatient counseling  Symptoms   Depressed or irritable mood.: improving Difficulty being assertive:  maintained.  History of at least one hypomanic, manic, or mixed mood episode. Improved Hypervigilance (e.g., feeling constantly on edge, experiencing concentration difficulties, having trouble falling or staying asleep, exhibiting a general state of irritability). Improving Lack of  energy. maintained.  Motor tension (e.g., restlessness, tiredness, shakiness, muscle tension). Improving Sleeplessness or hypersomnia. improved  Problems Addressed   Bipolar Disorder - Depression, Anxiety, Assertiveness Goals  1. Develop healthy cognitive patterns and beliefs about self and the world that lead to alleviation  and help prevent the relapse of mood episodes.  Objective  Identify and replace thoughts and behaviors that trigger manic or depressive symptoms. Target Date: 2023-01-14 Frequency: Daily  Progress: 30 Modality: individual  Related Interventions  Use cognitive therapy techniques to explore and educate the client about cognitive biases that trigger his/her elevated or depressive mood (see Cognitive Therapy for Bipolar Disorder by Lanny Hurst al.). 2. Enhance ability to effectively cope with the full variety of life's worries and anxieties.  Objective  Learn and implement problem-solving strategies for realistically addressing worries. Target Date: 2023-01-14 Frequency: Daily  Progress: 50 Modality: individual  Related Interventions  Teach the client problem-solving strategies involving specifically defining a problem, generating options for addressing it, evaluating the pros and cons of each option, selecting and implementing an optional action, and reevaluating and refining the action (or assign "Applying Problem-Solving to Interpersonal Conflict" in the Adult Psychotherapy Homework Planner by Bryn Gulling). Objective  Identify, challenge, and replace biased, fearful self-talk with positive, realistic, and empowering self-talk. Target Date: 2023-01-14 Frequency: Daily  Progress: 50 Modality: individual  Related Interventions  Explore the client's schema and self-talk that mediate his/her fear response; assist him/her in challenging the biases; replace the distorted messages with reality-based alternatives and positive, realistic self-talk that will increase his/her self-confidence in coping with irrational fears (see Cognitive Therapy of Anxiety Disorders by Alison Stalling). 3. difficulty w/ being assertive and setting boundaries  Objective  Increase the frequency of assertive behaviors and setting healthy boundaries Target Date: 2023-01-14 Frequency: Daily  Progress: 0 Modality: individual   Related Interventions  Teach assertive communication and healthy boundaries and ways to impelent     Conditions For Discharge  Achievement of treatment goals and objectives   Pt participated in tx plan and provided verbal consent.    Jan Fireman, [facility]

## 2022-06-28 ENCOUNTER — Ambulatory Visit (INDEPENDENT_AMBULATORY_CARE_PROVIDER_SITE_OTHER): Payer: Self-pay | Admitting: Psychology

## 2022-06-28 DIAGNOSIS — F319 Bipolar disorder, unspecified: Secondary | ICD-10-CM

## 2022-06-28 NOTE — Progress Notes (Signed)
[facility] Counselor/Therapist Progress Note  Patient ID: Victoria [name], MRN: [id number],    Date: 06/28/2022  Time Spent: 11:05am-11:48am   Treatment Type: Individual Therapy  pt is seen for a virtual video visit via caregility.  Pt joins from her home and counselor from her home office.   Reported Symptoms: financial stress, starting new job, identifying progress mae  Mental Status Exam: Appearance:  Well Groomed     Behavior: Appropriate  Motor: Normal  Speech/Language:  Normal Rate  Affect: Appropriate  Mood: worry  Thought process: normal  Thought content:   WNL  Sensory/Perceptual disturbances:   WNL  Orientation: oriented to person, place, time/date, and situation  Attention: Good  Concentration: Good  Memory: WNL  Fund of knowledge:  Good  Insight:   Good  Judgment:  Good  Impulse Control: Good   Risk Assessment: Danger to Self:  No Self-injurious Behavior: No Danger to Others: No Duty to Warn:no Physical Aggression / Violence:No  Access to Firearms a concern: No  Gang Involvement:No   Subjective: Counselor assessed pt current functioning per pt report.  Processed w/ stressors and positives.  Reflected steps pt has taking and how she has followed through w/ her plans.  Discussed positive outcomes and continuing to identify steps taking and encouraging self w/ what's in her control. Pt affect wnl.  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  Pt discussed financial stressors behind on car payment and rent.  Pt reports she has caught up on all other bills and identifying steps taking towards continued progress.  Pt acknowledged steps taking and that progress happening- just slowly.   Pt reports she will f/u w/ pt accounting re: payment plan or any financial assistance.  Interventions: Cognitive Behavioral Therapy, Assertiveness/Communication, and positive self  talk.   Diagnosis:Bipolar I disorder ([place])  Plan: f/u w/ pt in 2 weeks for counseling to assist coping w/ stress, anxiety and mood stability.  Pt to f/u as scheduled w/ neurology and pt f/u as scheduled w/ PCP.   Encouraged to follow up w/ [facility] pt accounting for to explore options since currently uninsured and explore payment plan options.  Treatment Plan  01/13/22  Client Abilities/Strengths   supports: my maternal uncle and his friend [name] and my best friend- who I met in college. pt enjoys her dogs, reading/journaling. physical therapy has helped /w my increased physical activity. advocating for herself.  Client Treatment Preferences   biweekly counseling to start. pt to continue w/ PCP to manage meds. continue to f/u w/ neurologist.  Client Statement of Needs   Pt "some ways I'm self-aware and other ways I'm not. hard to decipher why I do things certain ways. I want to continue to work through understanding patterns that I get stuck in and change those. I have trouble setting up boundaries and saying no to things and that causes issues. work on being assertive."  Treatment Level   outpatient counseling  Symptoms   Depressed or irritable mood.: improving Difficulty being assertive:  maintained.  History of at least one hypomanic, manic, or mixed mood episode. Improved Hypervigilance (e.g., feeling constantly on edge, experiencing concentration difficulties, having trouble falling or staying asleep, exhibiting a general state of irritability). Improving Lack of energy. maintained.  Motor tension (e.g., restlessness, tiredness, shakiness, muscle tension). Improving Sleeplessness or hypersomnia. improved  Problems Addressed   Bipolar Disorder - Depression, Anxiety,  Assertiveness Goals  1. Develop healthy cognitive patterns and beliefs about self and the world that lead to alleviation and help prevent the relapse of mood episodes.  Objective  Identify and replace thoughts and  behaviors that trigger manic or depressive symptoms. Target Date: 2023-01-14 Frequency: Daily  Progress: 30 Modality: individual  Related Interventions  Use cognitive therapy techniques to explore and educate the client about cognitive biases that trigger his/her elevated or depressive mood (see Cognitive Therapy for Bipolar Disorder by Lanny Hurst al.). 2. Enhance ability to effectively cope with the full variety of life's worries and anxieties.  Objective  Learn and implement problem-solving strategies for realistically addressing worries. Target Date: 2023-01-14 Frequency: Daily  Progress: 50 Modality: individual  Related Interventions  Teach the client problem-solving strategies involving specifically defining a problem, generating options for addressing it, evaluating the pros and cons of each option, selecting and implementing an optional action, and reevaluating and refining the action (or assign "Applying Problem-Solving to Interpersonal Conflict" in the Adult Psychotherapy Homework Planner by Bryn Gulling). Objective  Identify, challenge, and replace biased, fearful self-talk with positive, realistic, and empowering self-talk. Target Date: 2023-01-14 Frequency: Daily  Progress: 50 Modality: individual  Related Interventions  Explore the client's schema and self-talk that mediate his/her fear response; assist him/her in challenging the biases; replace the distorted messages with reality-based alternatives and positive, realistic self-talk that will increase his/her self-confidence in coping with irrational fears (see Cognitive Therapy of Anxiety Disorders by Alison Stalling). 3. difficulty w/ being assertive and setting boundaries  Objective  Increase the frequency of assertive behaviors and setting healthy boundaries Target Date: 2023-01-14 Frequency: Daily  Progress: 0 Modality: individual  Related Interventions  Teach assertive communication and healthy boundaries and ways to  impelent     Conditions For Discharge  Achievement of treatment goals and objectives   Pt participated in tx plan and provided verbal consent.    Jan Fireman, [facility]

## 2022-07-19 ENCOUNTER — Ambulatory Visit: Payer: Self-pay | Admitting: Psychology

## 2022-07-20 ENCOUNTER — Ambulatory Visit (INDEPENDENT_AMBULATORY_CARE_PROVIDER_SITE_OTHER): Payer: Self-pay | Admitting: Psychology

## 2022-07-20 DIAGNOSIS — F319 Bipolar disorder, unspecified: Secondary | ICD-10-CM

## 2022-07-20 NOTE — Progress Notes (Signed)
[place] Counselor/Therapist Progress Note  Patient ID: Victoria [name], MRN: [id number],    Date: 07/20/2022  Time Spent: 9:05am-9:44am   Treatment Type: Individual Therapy  pt is seen for a virtual video visit via caregility.  Pt joins from her home and counselor from her home office.   Reported Symptoms: some days feeling sad,  busy w/ working past 2 weeks Mental Status Exam: Appearance:  Well Groomed     Behavior: Appropriate  Motor: Normal  Speech/Language:  Normal Rate  Affect: Appropriate  Mood: sad  Thought process: normal  Thought content:   WNL  Sensory/Perceptual disturbances:   WNL  Orientation: oriented to person, place, time/date, and situation  Attention: Good  Concentration: Good  Memory: WNL  Fund of knowledge:  Good  Insight:   Good  Judgment:  Good  Impulse Control: Good   Risk Assessment: Danger to Self:  No Self-injurious Behavior: No Danger to Others: No Duty to Warn:no Physical Aggression / Violence:No  Access to Firearms a concern: No  Gang Involvement:No   Subjective: Counselor assessed pt current functioning per pt report.  Processed w/ mood, stressors and positives.  Reflected progress towards plan and seeing steps she has been able to take financially.  Explored engagement in activities and w/ others and encouraged to assist w/ improving mood.  Pt affect wnl.  Pt reported she has been busy working every day the past 2 weeks between new job training and other job.  Pt recognized that won't be able to maintain and that old job not making enough to put efforts there.  Pt reported that she has caught up on other bills and seeing progress.  Pt identifying feeling more sad over past week and acknowledges contributing factors of change and lack of free time to engage w/ other things outside of work.  Pt reflected on ways to be intentional about this w/ next couple days off.  Interventions: Cognitive Behavioral Therapy,  Solution-Oriented/Positive Psychology, and positive self talk.   Diagnosis:Bipolar I disorder ([place])  Plan: f/u w/ pt in 2 weeks for counseling to assist coping w/ stress, anxiety and mood stability.  Pt to f/u as scheduled w/ neurology and pt f/u as scheduled w/ PCP.   Encouraged to follow up w/ [facility] pt accounting for to explore options since currently uninsured and explore payment plan options.  Treatment Plan  01/13/22  Client Abilities/Strengths   supports: my maternal uncle and his friend [name] and my best friend- who I met in college. pt enjoys her dogs, reading/journaling. physical therapy has helped /w my increased physical activity. advocating for herself.  Client Treatment Preferences   biweekly counseling to start. pt to continue w/ PCP to manage meds. continue to f/u w/ neurologist.  Client Statement of Needs   Pt "some ways I'm self-aware and other ways I'm not. hard to decipher why I do things certain ways. I want to continue to work through understanding patterns that I get stuck in and change those. I have trouble setting up boundaries and saying no to things and that causes issues. work on being assertive."  Treatment Level   outpatient counseling  Symptoms   Depressed or irritable mood.: improving Difficulty being assertive:  maintained.  History of at least one hypomanic, manic, or mixed mood episode. Improved Hypervigilance (e.g., feeling constantly on edge, experiencing concentration difficulties, having trouble falling or staying asleep, exhibiting a general state of irritability). Improving Lack of energy. maintained.  Motor tension (e.g.,  restlessness, tiredness, shakiness, muscle tension). Improving Sleeplessness or hypersomnia. improved  Problems Addressed   Bipolar Disorder - Depression, Anxiety, Assertiveness Goals  1. Develop healthy cognitive patterns and beliefs about self and the world that lead to alleviation and help prevent the relapse of mood  episodes.  Objective  Identify and replace thoughts and behaviors that trigger manic or depressive symptoms. Target Date: 2023-01-14 Frequency: Daily  Progress: 30 Modality: individual  Related Interventions  Use cognitive therapy techniques to explore and educate the client about cognitive biases that trigger his/her elevated or depressive mood (see Cognitive Therapy for Bipolar Disorder by Lanny Hurst al.). 2. Enhance ability to effectively cope with the full variety of life's worries and anxieties.  Objective  Learn and implement problem-solving strategies for realistically addressing worries. Target Date: 2023-01-14 Frequency: Daily  Progress: 50 Modality: individual  Related Interventions  Teach the client problem-solving strategies involving specifically defining a problem, generating options for addressing it, evaluating the pros and cons of each option, selecting and implementing an optional action, and reevaluating and refining the action (or assign "Applying Problem-Solving to Interpersonal Conflict" in the Adult Psychotherapy Homework Planner by Bryn Gulling). Objective  Identify, challenge, and replace biased, fearful self-talk with positive, realistic, and empowering self-talk. Target Date: 2023-01-14 Frequency: Daily  Progress: 50 Modality: individual  Related Interventions  Explore the client's schema and self-talk that mediate his/her fear response; assist him/her in challenging the biases; replace the distorted messages with reality-based alternatives and positive, realistic self-talk that will increase his/her self-confidence in coping with irrational fears (see Cognitive Therapy of Anxiety Disorders by Alison Stalling). 3. difficulty w/ being assertive and setting boundaries  Objective  Increase the frequency of assertive behaviors and setting healthy boundaries Target Date: 2023-01-14 Frequency: Daily  Progress: 0 Modality: individual  Related Interventions  Teach  assertive communication and healthy boundaries and ways to impelent     Conditions For Discharge  Achievement of treatment goals and objectives   Pt participated in tx plan and provided verbal consent.    Jan Fireman [facility]               [place], [facility]

## 2022-08-09 ENCOUNTER — Ambulatory Visit (INDEPENDENT_AMBULATORY_CARE_PROVIDER_SITE_OTHER): Payer: Self-pay | Admitting: Psychology

## 2022-08-09 DIAGNOSIS — F319 Bipolar disorder, unspecified: Secondary | ICD-10-CM

## 2022-08-09 NOTE — Progress Notes (Signed)
[place] Counselor/Therapist Progress Note  Patient ID: Victoria [name], MRN: [id number],    Date: 08/09/2022  Time Spent: 9:02am-9:45am   Treatment Type: Individual Therapy  pt is seen for a virtual video visit via caregility.  Pt joins from her home and counselor from her home office.   Reported Symptoms: frustration w/ slow progress w/ financial goals Mental Status Exam: Appearance:  Well Groomed     Behavior: Appropriate  Motor: Normal  Speech/Language:  Normal Rate  Affect: Appropriate  Mood: depressed  Thought process: normal  Thought content:   WNL  Sensory/Perceptual disturbances:   WNL  Orientation: oriented to person, place, time/date, and situation  Attention: Good  Concentration: Good  Memory: WNL  Fund of knowledge:  Good  Insight:   Good  Judgment:  Good  Impulse Control: Good   Risk Assessment: Danger to Self:  No Self-injurious Behavior: No Danger to Others: No Duty to Warn:no Physical Aggression / Violence:No  Access to Firearms a concern: No  Gang Involvement:No   Subjective: Counselor assessed pt current functioning per pt report.  Processed w/ mood and stressors.  Reflected progress towards financial goals and validated frustration about pace. Assisted pt in not internalizing messages about irresponsibility and continued positive self talk.  Pt affect wnl.  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  Pt reported she is making progress financially but very slow and not enough to get caught up.  Pt discussed frustration that serving not working out as other state or promise.  Pt expressed frustration w/ lack of support and questions that imply irresponsible.  Pt is able to acknowledge that she is working hard and has sacrificed a lot to meet needs.    Interventions: Cognitive Behavioral Therapy, Solution-Oriented/Positive Psychology, and positive self talk.   Diagnosis:Bipolar  I disorder ([place])  Plan: f/u w/ pt in 2 weeks for counseling to assist coping w/ stress, anxiety and mood stability.  Pt to f/u as scheduled w/ neurology and pt f/u as scheduled w/ PCP.   Encouraged to follow up w/ [facility] pt accounting for to explore options since currently uninsured and explore payment plan options.  Treatment Plan  01/13/22  Client Abilities/Strengths   supports: my maternal uncle and his friend Victoria [name] and my best friend- who I met in college. pt enjoys her dogs, reading/journaling. physical therapy has helped /w my increased physical activity. advocating for herself.  Client Treatment Preferences   biweekly counseling to start. pt to continue w/ PCP to manage meds. continue to f/u w/ neurologist.  Client Statement of Needs   Pt "some ways I'm self-aware and other ways I'm not. hard to decipher why I do things certain ways. I want to continue to work through understanding patterns that I get stuck in and change those. I have trouble setting up boundaries and saying no to things and that causes issues. work on being assertive."  Treatment Level   outpatient counseling  Symptoms   Depressed or irritable mood.: improving Difficulty being assertive:  maintained.  History of at least one hypomanic, manic, or mixed mood episode. Improved Hypervigilance (e.g., feeling constantly on edge, experiencing concentration difficulties, having trouble falling or staying asleep, exhibiting a general state of irritability). Improving Lack of energy. maintained.  Motor tension (e.g., restlessness, tiredness, shakiness, muscle tension). Improving Sleeplessness or hypersomnia. improved  Problems Addressed   Bipolar Disorder - Depression, Anxiety, Assertiveness Goals  1. Develop healthy cognitive patterns and beliefs about self and the world that lead to alleviation and help prevent the relapse of mood episodes.  Objective  Identify and replace thoughts and behaviors that trigger manic or  depressive symptoms. Target Date: 2023-01-14 Frequency: Daily  Progress: 30 Modality: individual  Related Interventions  Use cognitive therapy techniques to explore and educate the client about cognitive biases that trigger his/her elevated or depressive mood (see Cognitive Therapy for Bipolar Disorder by Victoria [name] al.). 2. Enhance ability to effectively cope with the full variety of life's worries and anxieties.  Objective  Learn and implement problem-solving strategies for realistically addressing worries. Target Date: 2023-01-14 Frequency: Daily  Progress: 50 Modality: individual  Related Interventions  Teach the client problem-solving strategies involving specifically defining a problem, generating options for addressing it, evaluating the pros and cons of each option, selecting and implementing an optional action, and reevaluating and refining the action (or assign "Applying Problem-Solving to Interpersonal Conflict" in the Adult Psychotherapy Homework Planner by Victoria [name]). Objective  Identify, challenge, and replace biased, fearful self-talk with positive, realistic, and empowering self-talk. Target Date: 2023-01-14 Frequency: Daily  Progress: 50 Modality: individual  Related Interventions  Explore the client's schema and self-talk that mediate his/her fear response; assist him/her in challenging the biases; replace the distorted messages with reality-based alternatives and positive, realistic self-talk that will increase his/her self-confidence in coping with irrational fears (see Cognitive Therapy of Anxiety Disorders by Victoria [name]). 3. difficulty w/ being assertive and setting boundaries  Objective  Increase the frequency of assertive behaviors and setting healthy boundaries Target Date: 2023-01-14 Frequency: Daily  Progress: 0 Modality: individual  Related Interventions  Teach assertive communication and healthy boundaries and ways to impelent     Conditions For  Discharge  Achievement of treatment goals and objectives   Pt participated in tx plan and provided verbal consent.     Victoria [name], [facility]

## 2022-08-19 ENCOUNTER — Other Ambulatory Visit: Payer: Self-pay

## 2022-08-19 DIAGNOSIS — I341 Nonrheumatic mitral (valve) prolapse: Secondary | ICD-10-CM

## 2022-08-30 ENCOUNTER — Ambulatory Visit (INDEPENDENT_AMBULATORY_CARE_PROVIDER_SITE_OTHER): Payer: Self-pay | Admitting: Psychology

## 2022-08-30 DIAGNOSIS — F319 Bipolar disorder, unspecified: Secondary | ICD-10-CM
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
## 2022-09-09 ENCOUNTER — Ambulatory Visit: Payer: 59 | Admitting: Internal Medicine

## 2022-09-13 ENCOUNTER — Ambulatory Visit (INDEPENDENT_AMBULATORY_CARE_PROVIDER_SITE_OTHER): Payer: Self-pay | Admitting: Psychology

## 2022-09-13 DIAGNOSIS — F319 Bipolar disorder, unspecified: Secondary | ICD-10-CM
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
## 2022-09-15 ENCOUNTER — Ambulatory Visit: Payer: 59 | Admitting: Internal Medicine

## 2022-09-20 ENCOUNTER — Ambulatory Visit (HOSPITAL_COMMUNITY): Payer: Self-pay

## 2022-09-28 ENCOUNTER — Ambulatory Visit (INDEPENDENT_AMBULATORY_CARE_PROVIDER_SITE_OTHER): Payer: Self-pay | Admitting: Psychology

## 2022-09-28 DIAGNOSIS — F319 Bipolar disorder, unspecified: Secondary | ICD-10-CM

## 2022-09-28 NOTE — Progress Notes (Signed)
[facility] Counselor/Therapist Progress Note  Patient ID: Victoria [name], MRN: [id number],    Date: 09/28/2022  Time Spent: 9:03am-9:38am   Treatment Type: Individual Therapy  pt is seen for a virtual video visit via caregility.  Pt joins from her home and counselor from her home office.   Reported Symptoms: increased hope, seeing progress for self  Mental Status Exam: Appearance:  Well Groomed     Behavior: Appropriate  Motor: Normal  Speech/Language:  Normal Rate  Affect: Appropriate  Mood: normal  Thought process: normal  Thought content:   WNL  Sensory/Perceptual disturbances:   WNL  Orientation: oriented to person, place, time/date, and situation  Attention: Good  Concentration: Good  Memory: WNL  Fund of knowledge:  Good  Insight:   Good  Judgment:  Good  Impulse Control: Good   Risk Assessment: Danger to Self:  No Self-injurious Behavior: No Danger to Others: No Duty to Warn:no Physical Aggression / Violence:No  Access to Firearms a concern: No  Gang Involvement:No   Subjective: Counselor assessed pt current functioning per pt report.  Processed w/ pt mood and progress.  Reflected steps she continued to take and payoff.  Explored w/pt decisions w/ work and what is beneficial to her goals.  Pt affect wnl.  Pt reports she has been working more and seeing increase w/ pay.  Pt reported that she was able to get a new car w/ mom cosigning and she was able to go to [facility] for her appointment.  Pt sees progress making to her financial and personal goals and this give more hope.  Pt discussed decisions w/ work for better financial outcomes while she looks for another job.   Interventions: Cognitive Behavioral Therapy, Solution-Oriented/Positive Psychology, and positive self talk.   Diagnosis:Bipolar I disorder ([place])  Plan: f/u w/ pt in 3 weeks for counseling to assist coping w/ stress, anxiety and mood stability.  Pt to f/u as  scheduled w/ neurology and pt f/u as scheduled w/ PCP.   Encouraged to follow up w/ [facility] pt accounting for to explore options since currently uninsured and explore payment plan options.  Treatment Plan  01/13/22  Client Abilities/Strengths   supports: my maternal uncle and his friend [name] and my best friend- who I met in college. pt enjoys her dogs, reading/journaling. physical therapy has helped /w my increased physical activity. advocating for herself.  Client Treatment Preferences   biweekly counseling to start. pt to continue w/ PCP to manage meds. continue to f/u w/ neurologist.  Client Statement of Needs   Pt "some ways I'm self-aware and other ways I'm not. hard to decipher why I do things certain ways. I want to continue to work through understanding patterns that I get stuck in and change those. I have trouble setting up boundaries and saying no to things and that causes issues. work on being assertive."  Treatment Level   outpatient counseling  Symptoms   Depressed or irritable mood.: improving Difficulty being assertive:  maintained.  History of at least one hypomanic, manic, or mixed mood episode. Improved Hypervigilance (e.g., feeling constantly on edge, experiencing concentration difficulties, having trouble falling or staying asleep, exhibiting a general state of irritability). Improving Lack of energy. maintained.  Motor tension (e.g., restlessness, tiredness, shakiness, muscle tension). Improving Sleeplessness or hypersomnia. improved  Problems Addressed   Bipolar Disorder - Depression, Anxiety, Assertiveness Goals  1. Develop healthy cognitive patterns and beliefs about self and the world that  lead to alleviation and help prevent the relapse of mood episodes.  Objective  Identify and replace thoughts and behaviors that trigger manic or depressive symptoms. Target Date: 2023-01-14 Frequency: Daily  Progress: 30 Modality: individual  Related Interventions  Use  cognitive therapy techniques to explore and educate the client about cognitive biases that trigger his/her elevated or depressive mood (see Cognitive Therapy for Bipolar Disorder by Lanny Hurst al.). 2. Enhance ability to effectively cope with the full variety of life's worries and anxieties.  Objective  Learn and implement problem-solving strategies for realistically addressing worries. Target Date: 2023-01-14 Frequency: Daily  Progress: 50 Modality: individual  Related Interventions  Teach the client problem-solving strategies involving specifically defining a problem, generating options for addressing it, evaluating the pros and cons of each option, selecting and implementing an optional action, and reevaluating and refining the action (or assign "Applying Problem-Solving to Interpersonal Conflict" in the Adult Psychotherapy Homework Planner by Bryn Gulling). Objective  Identify, challenge, and replace biased, fearful self-talk with positive, realistic, and empowering self-talk. Target Date: 2023-01-14 Frequency: Daily  Progress: 50 Modality: individual  Related Interventions  Explore the client's schema and self-talk that mediate his/her fear response; assist him/her in challenging the biases; replace the distorted messages with reality-based alternatives and positive, realistic self-talk that will increase his/her self-confidence in coping with irrational fears (see Cognitive Therapy of Anxiety Disorders by Alison Stalling). 3. difficulty w/ being assertive and setting boundaries  Objective  Increase the frequency of assertive behaviors and setting healthy boundaries Target Date: 2023-01-14 Frequency: Daily  Progress: 0 Modality: individual  Related Interventions  Teach assertive communication and healthy boundaries and ways to impelent     Conditions For Discharge  Achievement of treatment goals and objectives   Pt participated in tx plan and provided verbal  consent.       Jan Fireman, [facility]

## 2022-09-29 ENCOUNTER — Ambulatory Visit (HOSPITAL_COMMUNITY)
Admission: RE | Admit: 2022-09-29 | Discharge: 2022-09-29 | Disposition: A | Discharge status: Home / Self Care | Payer: Self-pay | Source: Ambulatory Visit | Attending: Cardiology | Admitting: Cardiology

## 2022-09-29 DIAGNOSIS — I341 Nonrheumatic mitral (valve) prolapse: Secondary | ICD-10-CM | POA: Insufficient documentation

## 2022-09-29 LAB — ECHOCARDIOGRAM COMPLETE
AR max vel: 1.97 cm2
AV Area VTI: 1.88 cm2
AV Area mean vel: 2.13 cm2
AV Mean grad: 5 mmHg
AV Peak grad: 9 mmHg
Ao pk vel: 1.5 m/s
Area-P 1/2: 2.99 cm2
S' Lateral: 2.8 cm

## 2022-09-29 NOTE — Progress Notes (Signed)
*  PRELIMINARY RESULTS* Echocardiogram 2D Echocardiogram has been performed.  Stacey Drain 09/29/2022, 3:43 PM

## 2022-09-30 ENCOUNTER — Encounter: Payer: Self-pay | Admitting: Cardiology

## 2022-10-06 ENCOUNTER — Ambulatory Visit: Payer: 59 | Admitting: Internal Medicine

## 2022-10-19 ENCOUNTER — Ambulatory Visit (INDEPENDENT_AMBULATORY_CARE_PROVIDER_SITE_OTHER): Payer: Self-pay | Admitting: Psychology

## 2022-10-19 DIAGNOSIS — F319 Bipolar disorder, unspecified: Secondary | ICD-10-CM

## 2022-10-19 NOTE — Progress Notes (Signed)
[facility] Counselor/Therapist Progress Note  Patient ID: Victoria [name], MRN: [id number],    Date: 10/19/2022  Time Spent: 9:01am-9:42am   Treatment Type: Individual Therapy  [name] is seen for a virtual video visit via caregility.  [name] joins from her home and counselor from her home office.   Reported Symptoms: mood stable,  making progress w/ finances  Mental Status Exam: Appearance:  Well Groomed     Behavior: Appropriate  Motor: Normal  Speech/Language:  Normal Rate  Affect: Appropriate  Mood: normal  Thought process: normal  Thought content:   WNL  Sensory/Perceptual disturbances:   WNL  Orientation: oriented to person, place, time/date, and situation  Attention: Good  Concentration: Good  Memory: WNL  Fund of knowledge:  Good  Insight:   Good  Judgment:  Good  Impulse Control: Good   Risk Assessment: Danger to Self:  No Self-injurious Behavior: No Danger to Others: No Duty to Warn:no Physical Aggression / Violence:No  Access to Firearms a concern: No  Gang Involvement:No   Subjective: Counselor assessed [name] current functioning per [name] report.  Processed w/ [name] mood and stressors.  Reflected positive steps taking towards financial goals to reduce stressors.  Explored what is in her control and focus on those things.  [name] affect wnl.  [name] reports fatigue w/ working a lot recent- but mood good.  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 [name] reports slowly making progress w/ finances.   [name] reported that she had her f/u w/ Victoria [name] and learned a lot and some f/u recommended to see if has autoimmune d/o.  [name] reported that had some recommendations for tx but can't afford currently.  [name] reports she is going to work only at [employer] moving forward and reduce stress w/ juggling both and get hours at one.   Interventions: Cognitive Behavioral Therapy, Solution-Oriented/Positive Psychology, and positive self talk.   Diagnosis:Bipolar  I disorder ([facility])  Plan: f/u w/ [name] in 3 weeks for counseling to assist coping w/ stress, anxiety and mood stability.  [name] to f/u as scheduled w/ [name] and [name] f/u as scheduled w/ PCP.   Encouraged to follow up w/ Victoria [name] accounting for to explore options since currently uninsured and explore payment plan options.  Treatment Plan  01/13/22  Client Abilities/Strengths   supports: my maternal uncle and his friend Victoria [name] and my best friend- who I met in college. [name] enjoys her dogs, reading/journaling. physical therapy has helped /w my increased physical activity. advocating for herself.  Client Treatment Preferences   biweekly counseling to start. [name] to continue w/ PCP to manage meds. continue to f/u w/ neurologist.  Client Statement of Needs   [name] "some ways I'm self-aware and other ways I'm not. hard to decipher why I do things certain ways. I want to continue to work through understanding patterns that I get stuck in and change those. I have trouble setting up boundaries and saying no to things and that causes issues. work on being assertive."  Treatment Level   outpatient counseling  Symptoms   Depressed or irritable mood.: improving Difficulty being assertive:  maintained.  History of at least one hypomanic, manic, or mixed mood episode. Improved Hypervigilance (e.g., feeling constantly on edge, experiencing concentration difficulties, having trouble falling or staying asleep, exhibiting a general state of irritability). Improving Lack of energy. maintained.  Motor tension (e.g., restlessness, tiredness, shakiness, muscle tension). Improving Sleeplessness or hypersomnia. improved  Problems Addressed  Bipolar Disorder - Depression, Anxiety, Assertiveness Goals  1. Develop healthy cognitive patterns and beliefs about self and the world that lead to alleviation and help prevent the relapse of mood episodes.  Objective  Identify and replace thoughts and behaviors that trigger manic or  depressive symptoms. Target Date: 2023-01-14 Frequency: Daily  Progress: 30 Modality: individual  Related Interventions  Use cognitive therapy techniques to explore and educate the client about cognitive biases that trigger his/her elevated or depressive mood (see Cognitive Therapy for Bipolar Disorder by Lanny Hurst al.). 2. Enhance ability to effectively cope with the full variety of life's worries and anxieties.  Objective  Learn and implement problem-solving strategies for realistically addressing worries. Target Date: 2023-01-14 Frequency: Daily  Progress: 50 Modality: individual  Related Interventions  Teach the client problem-solving strategies involving specifically defining a problem, generating options for addressing it, evaluating the pros and cons of each option, selecting and implementing an optional action, and reevaluating and refining the action (or assign "Applying Problem-Solving to Interpersonal Conflict" in the Adult Psychotherapy Homework Planner by Bryn Gulling). Objective  Identify, challenge, and replace biased, fearful self-talk with positive, realistic, and empowering self-talk. Target Date: 2023-01-14 Frequency: Daily  Progress: 50 Modality: individual  Related Interventions  Explore the client's schema and self-talk that mediate his/her fear response; assist him/her in challenging the biases; replace the distorted messages with reality-based alternatives and positive, realistic self-talk that will increase his/her self-confidence in coping with irrational fears (see Cognitive Therapy of Anxiety Disorders by Alison Stalling). 3. difficulty w/ being assertive and setting boundaries  Objective  Increase the frequency of assertive behaviors and setting healthy boundaries Target Date: 2023-01-14 Frequency: Daily  Progress: 0 Modality: individual  Related Interventions  Teach assertive communication and healthy boundaries and ways to impelent     Conditions For  Discharge  Achievement of treatment goals and objectives   [name] participated in tx plan and provided verbal consent.           Jan Fireman, [facility]

## 2022-10-20 ENCOUNTER — Ambulatory Visit: Payer: 59 | Admitting: Internal Medicine

## 2022-11-10 ENCOUNTER — Ambulatory Visit (INDEPENDENT_AMBULATORY_CARE_PROVIDER_SITE_OTHER): Payer: Self-pay | Admitting: Psychology

## 2022-11-10 DIAGNOSIS — F319 Bipolar disorder, unspecified: Secondary | ICD-10-CM

## 2022-11-10 NOTE — Progress Notes (Signed)
[facility] Counselor/Therapist Progress Note  Patient ID: IFRAH VEST, MRN: [id number],    Date: 11/10/2022  Time Spent: 09:38HW-[id number]   Treatment Type: Individual Therapy  pt is seen for a virtual video visit via caregility.  Pt joins from her home and counselor from her home office.   Reported Symptoms: mood stable,  making progress w/ finances  Mental Status Exam: Appearance:  Well Groomed     Behavior: Appropriate  Motor: Normal  Speech/Language:  Normal Rate  Affect: Appropriate  Mood: normal  Thought process: normal  Thought content:   WNL  Sensory/Perceptual disturbances:   WNL  Orientation: oriented to person, place, time/date, and situation  Attention: Good  Concentration: Good  Memory: WNL  Fund of knowledge:  Good  Insight:   Good  Judgment:  Good  Impulse Control: Good   Risk Assessment: Danger to Self:  No Self-injurious Behavior: No Danger to Others: No Duty to Warn:no Physical Aggression / Violence:No  Access to Firearms a concern: No  Gang Involvement:No   Subjective: Counselor assessed pt current functioning per pt report.  Processed w/ pt mood and changes.  Reflected positive steps w/ progress and stressors of uncertainty about consistency w/ wages. Explored recent request from family friend to move in and identifying ways to say no.   Pt affect wnl.  Pt reports her mood has been good- stable.  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  Pt reported that still exploring other jobs and will talk w/ manager to ask for increased shifts.  Pt reported that a family friend asked to move in and pt knows that reliable for paying, however this person very differently lifestyle- parties, always inviting over to help them out and aware that wouldn't work to be roommates.  Pt reported wished would have said no from beginning.  Pt identified  ways can say no now.    Interventions: Cognitive Behavioral Therapy, Assertiveness/Communication, and Solution-Oriented/Positive Psychology  Diagnosis:Bipolar I disorder ([place])  Plan: f/u w/ pt in 3 weeks for counseling to assist coping w/ stress, anxiety and mood stability.  Pt to f/u as scheduled w/ neurology and pt f/u as scheduled w/ PCP.   Encouraged to follow up w/ [facility] pt accounting for to explore options since currently uninsured and explore payment plan options.  Pt reports will have insurance that starts Feb 2024.   Treatment Plan  01/13/22  Client Abilities/Strengths   supports: my maternal uncle and his friend [name] and my best friend- who I met in college. pt enjoys her dogs, reading/journaling. physical therapy has helped /w my increased physical activity. advocating for herself.  Client Treatment Preferences   biweekly counseling to start. pt to continue w/ PCP to manage meds. continue to f/u w/ neurologist.  Client Statement of Needs   Pt "some ways I'm self-aware and other ways I'm not. hard to decipher why I do things certain ways. I want to continue to work through understanding patterns that I get stuck in and change those. I have trouble setting up boundaries and saying no to things and that causes issues. work on being assertive."  Treatment Level   outpatient counseling  Symptoms   Depressed or irritable mood.: improving Difficulty being assertive:  maintained.  History of at least one hypomanic, manic, or mixed mood episode. Improved Hypervigilance (e.g., feeling constantly on edge, experiencing  concentration difficulties, having trouble falling or staying asleep, exhibiting a general state of irritability). Improving Lack of energy. maintained.  Motor tension (e.g., restlessness, tiredness, shakiness, muscle tension). Improving Sleeplessness or hypersomnia. improved  Problems Addressed   Bipolar Disorder - Depression, Anxiety, Assertiveness Goals  1. Develop  healthy cognitive patterns and beliefs about self and the world that lead to alleviation and help prevent the relapse of mood episodes.  Objective  Identify and replace thoughts and behaviors that trigger manic or depressive symptoms. Target Date: 2023-01-14 Frequency: Daily  Progress: 30 Modality: individual  Related Interventions  Use cognitive therapy techniques to explore and educate the client about cognitive biases that trigger his/her elevated or depressive mood (see Cognitive Therapy for Bipolar Disorder by Lanny Hurst al.). 2. Enhance ability to effectively cope with the full variety of life's worries and anxieties.  Objective  Learn and implement problem-solving strategies for realistically addressing worries. Target Date: 2023-01-14 Frequency: Daily  Progress: 50 Modality: individual  Related Interventions  Teach the client problem-solving strategies involving specifically defining a problem, generating options for addressing it, evaluating the pros and cons of each option, selecting and implementing an optional action, and reevaluating and refining the action (or assign "Applying Problem-Solving to Interpersonal Conflict" in the Adult Psychotherapy Homework Planner by Bryn Gulling). Objective  Identify, challenge, and replace biased, fearful self-talk with positive, realistic, and empowering self-talk. Target Date: 2023-01-14 Frequency: Daily  Progress: 50 Modality: individual  Related Interventions  Explore the client's schema and self-talk that mediate his/her fear response; assist him/her in challenging the biases; replace the distorted messages with reality-based alternatives and positive, realistic self-talk that will increase his/her self-confidence in coping with irrational fears (see Cognitive Therapy of Anxiety Disorders by Alison Stalling). 3. difficulty w/ being assertive and setting boundaries  Objective  Increase the frequency of assertive behaviors and setting healthy  boundaries Target Date: 2023-01-14 Frequency: Daily  Progress: 0 Modality: individual  Related Interventions  Teach assertive communication and healthy boundaries and ways to impelent     Conditions For Discharge  Achievement of treatment goals and objectives   Pt participated in tx plan and provided verbal consent.         Jan Fireman, [facility]

## 2022-12-01 ENCOUNTER — Ambulatory Visit: Payer: 59 | Admitting: Internal Medicine

## 2022-12-01 ENCOUNTER — Ambulatory Visit (INDEPENDENT_AMBULATORY_CARE_PROVIDER_SITE_OTHER): Payer: Self-pay | Admitting: Psychology

## 2022-12-01 DIAGNOSIS — F319 Bipolar disorder, unspecified: Secondary | ICD-10-CM

## 2022-12-01 NOTE — Progress Notes (Signed)
[facility] Counselor/Therapist Progress Note  Patient ID: NIHA SREY, MRN: [id number],    Date: 12/01/2022  Time Spent: [id number]   Treatment Type: Individual Therapy  pt is seen for a virtual video visit via caregility.  Pt joins from her home and counselor from her home office.   Reported Symptoms: mood stable, setting boundaries/asserting  Mental Status Exam: Appearance:  Well Groomed     Behavior: Appropriate  Motor: Normal  Speech/Language:  Normal Rate  Affect: Appropriate  Mood: normal  Thought process: normal  Thought content:   WNL  Sensory/Perceptual disturbances:   WNL  Orientation: oriented to person, place, time/date, and situation  Attention: Good  Concentration: Good  Memory: WNL  Fund of knowledge:  Good  Insight:   Good  Judgment:  Good  Impulse Control: Good   Risk Assessment: Danger to Self:  No Self-injurious Behavior: No Danger to Others: No Duty to Warn:no Physical Aggression / Violence:No  Access to Firearms a concern: No  Gang Involvement:No   Subjective: Counselor assessed pt current functioning per pt report.  Processed w/ pt mood, positives and stressors.  Explored upcoming change w/ having temporary roommate. Reflected positives w/ asserting needs and setting boundaries.  Discussed how coping w/ changes to manager through effectively.  Encouraged pt to look at budgeting w/ current expenses/income to identify needs moving forward.   Pt affect wnl.  Pt reports her mood has been stable.  Pt reported that she has been struggling w/ some increased physical symptoms related to her health.  Pt reported that she has agreed to friend moving in temporary and has been able to express limits/boundaries needed and feels good that has been able to assert.  Pt acknowledged that will need to continue as things come up. Pt reported on progress feels making w/ finances but still struggling to pay rent consistent.  Pt recognized may need  to evaluate budget to make decisions w/ desired outcomes.   Interventions: Cognitive Behavioral Therapy, Assertiveness/Communication, and Solution-Oriented/Positive Psychology  Diagnosis:Bipolar I disorder ([place])  Plan: f/u w/ pt in 3 weeks for counseling to assist coping w/ stress, anxiety and mood stability.  Pt to f/u as scheduled w/ neurology and pt f/u as scheduled w/ PCP.   Encouraged to follow up w/ [facility] pt accounting for to explore options since currently uninsured and explore payment plan options.  Pt reports will have insurance that starts Feb 2024.   Treatment Plan  01/13/22  Client Abilities/Strengths   supports: my maternal uncle and his friend [name] and my best friend- who I met in college. pt enjoys her dogs, reading/journaling. physical therapy has helped /w my increased physical activity. advocating for herself.  Client Treatment Preferences   biweekly counseling to start. pt to continue w/ PCP to manage meds. continue to f/u w/ neurologist.  Client Statement of Needs   Pt "some ways I'm self-aware and other ways I'm not. hard to decipher why I do things certain ways. I want to continue to work through understanding patterns that I get stuck in and change those. I have trouble setting up boundaries and saying no to things and that causes issues. work on being assertive."  Treatment Level   outpatient counseling  Symptoms   Depressed or irritable mood.: improving Difficulty being assertive:  maintained.  History of at least one hypomanic, manic, or mixed mood episode. Improved Hypervigilance (e.g., feeling constantly on edge, experiencing concentration difficulties, having trouble falling or staying asleep, exhibiting a  general state of irritability). Improving Lack of energy. maintained.  Motor tension (e.g., restlessness, tiredness, shakiness, muscle tension). Improving Sleeplessness or hypersomnia. improved  Problems Addressed   Bipolar Disorder - Depression,  Anxiety, Assertiveness Goals  1. Develop healthy cognitive patterns and beliefs about self and the world that lead to alleviation and help prevent the relapse of mood episodes.  Objective  Identify and replace thoughts and behaviors that trigger manic or depressive symptoms. Target Date: 2023-01-14 Frequency: Daily  Progress: 30 Modality: individual  Related Interventions  Use cognitive therapy techniques to explore and educate the client about cognitive biases that trigger his/her elevated or depressive mood (see Cognitive Therapy for Bipolar Disorder by Lanny Hurst al.). 2. Enhance ability to effectively cope with the full variety of life's worries and anxieties.  Objective  Learn and implement problem-solving strategies for realistically addressing worries. Target Date: 2023-01-14 Frequency: Daily  Progress: 50 Modality: individual  Related Interventions  Teach the client problem-solving strategies involving specifically defining a problem, generating options for addressing it, evaluating the pros and cons of each option, selecting and implementing an optional action, and reevaluating and refining the action (or assign "Applying Problem-Solving to Interpersonal Conflict" in the Adult Psychotherapy Homework Planner by Bryn Gulling). Objective  Identify, challenge, and replace biased, fearful self-talk with positive, realistic, and empowering self-talk. Target Date: 2023-01-14 Frequency: Daily  Progress: 50 Modality: individual  Related Interventions  Explore the client's schema and self-talk that mediate his/her fear response; assist him/her in challenging the biases; replace the distorted messages with reality-based alternatives and positive, realistic self-talk that will increase his/her self-confidence in coping with irrational fears (see Cognitive Therapy of Anxiety Disorders by Alison Stalling). 3. difficulty w/ being assertive and setting boundaries  Objective  Increase the frequency  of assertive behaviors and setting healthy boundaries Target Date: 2023-01-14 Frequency: Daily  Progress: 0 Modality: individual  Related Interventions  Teach assertive communication and healthy boundaries and ways to impelent     Conditions For Discharge  Achievement of treatment goals and objectives   Pt participated in tx plan and provided verbal consent.         Jan Fireman [facility]               [place], [place]

## 2022-12-20 ENCOUNTER — Encounter: Payer: Self-pay | Admitting: Internal Medicine

## 2022-12-20 ENCOUNTER — Ambulatory Visit: Payer: 59 | Admitting: Internal Medicine

## 2022-12-22 ENCOUNTER — Ambulatory Visit: Payer: Self-pay | Admitting: Psychology

## 2022-12-29 ENCOUNTER — Ambulatory Visit (INDEPENDENT_AMBULATORY_CARE_PROVIDER_SITE_OTHER): Payer: BLUE CROSS/BLUE SHIELD | Admitting: Psychology

## 2022-12-29 ENCOUNTER — Ambulatory Visit (INDEPENDENT_AMBULATORY_CARE_PROVIDER_SITE_OTHER): Payer: BLUE CROSS/BLUE SHIELD | Admitting: Internal Medicine

## 2022-12-29 ENCOUNTER — Encounter: Payer: Self-pay | Admitting: Internal Medicine

## 2022-12-29 VITALS — BP 112/73 | HR 76 | Ht 64.0 in | Wt 179.2 lb

## 2022-12-29 DIAGNOSIS — I341 Nonrheumatic mitral (valve) prolapse: Secondary | ICD-10-CM

## 2022-12-29 DIAGNOSIS — F319 Bipolar disorder, unspecified: Secondary | ICD-10-CM

## 2022-12-29 DIAGNOSIS — J0110 Acute frontal sinusitis, unspecified: Secondary | ICD-10-CM

## 2022-12-29 DIAGNOSIS — J309 Allergic rhinitis, unspecified: Secondary | ICD-10-CM | POA: Diagnosis not present

## 2022-12-29 DIAGNOSIS — F3170 Bipolar disorder, currently in remission, most recent episode unspecified: Secondary | ICD-10-CM

## 2022-12-29 DIAGNOSIS — G90A Postural orthostatic tachycardia syndrome (POTS): Secondary | ICD-10-CM | POA: Diagnosis not present

## 2022-12-29 MED ORDER — AZITHROMYCIN 250 MG PO TABS: ORAL_TABLET | ORAL | 0 refills | Status: AC

## 2022-12-29 MED ORDER — FLUTICASONE PROPIONATE 50 MCG/ACT NA SUSP: 1.0000 | Freq: Every day | NASAL | 2 refills | Status: AC

## 2022-12-29 MED ORDER — FLUDROCORTISONE ACETATE 0.1 MG PO TABS: 100.0000 ug | ORAL_TABLET | Freq: Two times a day (BID) | ORAL | 3 refills | Status: DC

## 2022-12-29 NOTE — Assessment & Plan Note (Signed)
Noted on echo Followed by cardiology

## 2022-12-29 NOTE — Assessment & Plan Note (Signed)
Takes Zyrtec Refilled Flonase Currently has worsening of sinus symptoms, started empiric azithromycin Advised to use vaporizer for nasal congestion

## 2022-12-29 NOTE — Patient Instructions (Signed)
Please take Azithromycin for acute sinusitis.  Please use Flonase for nasal congestion.  Please continue taking other medications as prescribed.

## 2022-12-29 NOTE — Assessment & Plan Note (Signed)
Takes Fludrocortisone 0.1 mg BID Advised to avoid sudden changes in posture BMP was wnl in the last week Her headache symptoms are likely migraine, has been evaluated by Neurology
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
## 2022-12-29 NOTE — Progress Notes (Signed)
[facility] Counselor/Therapist Progress Note  Patient ID: Victoria [name], MRN: [id number],    Date: 12/29/2022  Time Spent: 9:02am-9:22m   Treatment Type: Individual Therapy  pt is seen for a virtual video visit via caregility.  Pt joins from her home and counselor from her home office.   Reported Symptoms: feeling more discouraged w/ recent medical concerns  Mental Status Exam: Appearance:  Well Groomed     Behavior: Appropriate  Motor: Normal  Speech/Language:  Normal Rate  Affect: Appropriate  Mood: sad  Thought process: normal  Thought content:   WNL  Sensory/Perceptual disturbances:   WNL  Orientation: oriented to person, place, time/date, and situation  Attention: Good  Concentration: Good  Memory: WNL  Fund of knowledge:  Good  Insight:   Good  Judgment:  Good  Impulse Control: Good   Risk Assessment: Danger to Self:  No Self-injurious Behavior: No Danger to Others: No Duty to Warn:no Physical Aggression / Violence:No  Access to Firearms a concern: No  Gang Involvement:No   Subjective: Counselor assessed pt current functioning per pt report.  Processed w/ pt mood, and recent medical stressor.  Reflected pt awareness of how impacted w/ increased negative thought patterns.  Explored pt coping and positive steps continuing for her health.   Discussed next steps w/ finances/job considerations.   Pt affect congruent w/ report of discouraged and recognizing more negative recent.  Pt reported that had incident of severe chest pain in middle of night.  Pt discussed how when did present to urgent care had resolved and wasn't able to identify anything.  Pt reports has felt more off since.  Pt discussed how roommate didn't move in as planned.  Pt reports she did get some things checked off her list, but continues to be behind w/ bills and recognize not making enough to meet monthly bills.  Pt is considering change to job and discussed options exploring.     Interventions: Cognitive Behavioral Therapy, Assertiveness/Communication, and Solution-Oriented/Positive Psychology  Diagnosis:Bipolar I disorder ([place])  Plan: f/u w/ pt in 3 weeks for counseling to assist coping w/ stress, anxiety and mood stability.  Pt to f/u as scheduled w/ neurology and pt f/u as scheduled w/ PCP.    Treatment Plan  01/13/22  Client Abilities/Strengths   supports: my maternal uncle and his friend [name] and my best friend- who I met in college. pt enjoys her dogs, reading/journaling. physical therapy has helped /w my increased physical activity. advocating for herself.  Client Treatment Preferences   biweekly counseling to start. pt to continue w/ PCP to manage meds. continue to f/u w/ neurologist.  Client Statement of Needs   Pt "some ways I'm self-aware and other ways I'm not. hard to decipher why I do things certain ways. I want to continue to work through understanding patterns that I get stuck in and change those. I have trouble setting up boundaries and saying no to things and that causes issues. work on being assertive."  Treatment Level   outpatient counseling  Symptoms   Depressed or irritable mood.: improving Difficulty being assertive:  maintained.  History of at least one hypomanic, manic, or mixed mood episode. Improved Hypervigilance (e.g., feeling constantly on edge, experiencing concentration difficulties, having trouble falling or staying asleep, exhibiting a general state of irritability). Improving Lack of energy. maintained.  Motor tension (e.g., restlessness, tiredness, shakiness, muscle tension). Improving Sleeplessness or hypersomnia. improved  Problems Addressed   Bipolar Disorder - Depression, Anxiety,  Assertiveness Goals  1. Develop healthy cognitive patterns and beliefs about self and the world that lead to alleviation and help prevent the relapse of mood episodes.  Objective  Identify and replace thoughts and behaviors that  trigger manic or depressive symptoms. Target Date: 2023-01-14 Frequency: Daily  Progress: 30 Modality: individual  Related Interventions  Use cognitive therapy techniques to explore and educate the client about cognitive biases that trigger his/her elevated or depressive mood (see Cognitive Therapy for Bipolar Disorder by Lanny Hurst al.). 2. Enhance ability to effectively cope with the full variety of life's worries and anxieties.  Objective  Learn and implement problem-solving strategies for realistically addressing worries. Target Date: 2023-01-14 Frequency: Daily  Progress: 50 Modality: individual  Related Interventions  Teach the client problem-solving strategies involving specifically defining a problem, generating options for addressing it, evaluating the pros and cons of each option, selecting and implementing an optional action, and reevaluating and refining the action (or assign "Applying Problem-Solving to Interpersonal Conflict" in the Adult Psychotherapy Homework Planner by Bryn Gulling). Objective  Identify, challenge, and replace biased, fearful self-talk with positive, realistic, and empowering self-talk. Target Date: 2023-01-14 Frequency: Daily  Progress: 50 Modality: individual  Related Interventions  Explore the client's schema and self-talk that mediate his/her fear response; assist him/her in challenging the biases; replace the distorted messages with reality-based alternatives and positive, realistic self-talk that will increase his/her self-confidence in coping with irrational fears (see Cognitive Therapy of Anxiety Disorders by Alison Stalling). 3. difficulty w/ being assertive and setting boundaries  Objective  Increase the frequency of assertive behaviors and setting healthy boundaries Target Date: 2023-01-14 Frequency: Daily  Progress: 0 Modality: individual  Related Interventions  Teach assertive communication and healthy boundaries and ways to impelent      Conditions For Discharge  Achievement of treatment goals and objectives   Pt participated in tx plan and provided verbal consent.       Jan Fireman, [facility]

## 2022-12-29 NOTE — Assessment & Plan Note (Signed)
Did not tolerate Zoloft and other SSRIs in the past Was on Lamictal in the past, does not have any local Psychiatrist currently Follow-up with [facility] therapist Had referred to psychiatry in [place] for bipolar disorder, but has not been able to see them yet

## 2023-01-07 ENCOUNTER — Ambulatory Visit: Payer: Self-pay | Admitting: Nurse Practitioner

## 2023-01-19 ENCOUNTER — Ambulatory Visit (INDEPENDENT_AMBULATORY_CARE_PROVIDER_SITE_OTHER): Payer: BLUE CROSS/BLUE SHIELD | Admitting: Psychology

## 2023-01-19 DIAGNOSIS — F319 Bipolar disorder, unspecified: Secondary | ICD-10-CM | POA: Diagnosis not present
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
## 2023-01-27 ENCOUNTER — Ambulatory Visit: Payer: Self-pay | Admitting: Nurse Practitioner

## 2023-02-09 ENCOUNTER — Ambulatory Visit (INDEPENDENT_AMBULATORY_CARE_PROVIDER_SITE_OTHER): Payer: BLUE CROSS/BLUE SHIELD | Admitting: Psychology

## 2023-02-09 DIAGNOSIS — F319 Bipolar disorder, unspecified: Secondary | ICD-10-CM | POA: Diagnosis not present

## 2023-02-09 NOTE — Progress Notes (Addendum)
[facility] Counselor/Therapist Progress Note  Patient ID: Victoria [name], MRN: [id number],    Date: 02/09/2023  Time Spent: 11:04am-11:48am   Treatment Type: Individual Therapy  pt is seen for a virtual video visit via caregility.  Pt joins from her home and counselor from her home office.   Reported Symptoms: acknowledged need to be intentional about taking step for change  Mental Status Exam: Appearance:  Well Groomed     Behavior: Appropriate  Motor: Normal  Speech/Language:  Normal Rate  Affect: Appropriate  Mood: normal  Thought process: normal  Thought content:   WNL  Sensory/Perceptual disturbances:   WNL  Orientation: oriented to person, place, time/date, and situation  Attention: Good  Concentration: Good  Memory: WNL  Fund of knowledge:  Good  Insight:   Good  Judgment:  Good  Impulse Control: Good   Risk Assessment: Danger to Self:  No Self-injurious Behavior: No Danger to Others: No Duty to Warn:no Physical Aggression / Violence:No  Access to Firearms a concern: No  Gang Involvement:No   Subjective: Counselor assessed pt current functioning per pt report.  Processed w/ pt mood, stressors and positives.  Explored stressors w/ fiances, work and assisted pt w/ planning for next steps.  Discussed ways to plan for making time for next steps.  Pt affect wnl.  Pt reported that she is doing ok w/ mood.  Pt reports she is still not making enough w/ current job/hours/pay to meet all bills.  Pt reported on engaging w/ friends and w/ family and interactions positive. Pt recognized she is still in same place of needing to take same steps.  Pt discussed just in routine of work/ home. Pt discussed intentions to apply to other others to make movement.     Interventions: Cognitive Behavioral Therapy, Assertiveness/Communication, and Solution-Oriented/Positive Psychology  Diagnosis:Bipolar I disorder (HCC)  Plan: f/u w/ pt in 3 weeks for counseling to  assist coping w/ stress, anxiety and mood stability.  Pt to f/u as scheduled w/ neurology and pt f/u as scheduled w/ PCP.     Treatment Plan  01/13/22  Client Abilities/Strengths  supports: my maternal uncle and his friend [name] and my best friend- who I met in college. pt enjoys her dogs, reading/journaling.  advocating for herself.   Client Treatment Preferences  Counseling 1-2 times a month. pt to continue w/ PCP to manage meds. continue to f/u w/ cardiologist.   Client Statement of Needs  Pt "to continue to work through understanding patterns that I get stuck in and change those. I have struggled with figuring out what I want for self personally and  I wan to be able to work towards that- to have a new job, direction and figure that out".  Treatment Level  outpatient counseling   Symptoms  Depressed or irritable mood. Difficulty being assertive:    History of at least one hypomanic, manic, or mixed mood episode. Improved Hypervigilance (e.g., feeling constantly on edge, experiencing concentration difficulties, having trouble falling or staying asleep, exhibiting a general state of irritability).  Lack of energy.  Motor tension (e.g., restlessness, tiredness, shakiness, muscle tension).  Sleeplessness or hypersomnia.   Problems Addressed  Bipolar Disorder - Depression, Anxiety, Assertiveness,   Goals  1. Develop healthy cognitive patterns and beliefs about self and the world that lead to alleviation and help prevent the relapse of mood episodes.  Objective Identify and replace thoughts and behaviors that trigger manic or depressive symptoms. Target Date:  2024-01-19 Frequency: Daily  Progress: 50 Modality: individual  Related Interventions  Use cognitive therapy techniques to explore and educate the client about cognitive biases that trigger his/her elevated or depressive mood (see Cognitive Therapy for Bipolar Disorder by Aggie Hacker al.).  2. Enhance ability to effectively cope  with the full variety of life's worries and anxieties.  Objective Learn and implement problem-solving strategies for realistically addressing worries. Target Date: 2024-01-19 Frequency: Daily  Progress: 60 Modality: individual  Related Interventions Teach the client problem-solving strategies involving specifically defining a problem, generating options for addressing it, evaluating the pros and cons of each option, selecting and implementing an optional action, and reevaluating and refining the action (or assign "Applying Problem-Solving to Interpersonal Conflict" in the Adult Psychotherapy Homework Planner by Stephannie Li).  Objective Identify, challenge, and replace biased, fearful self-talk with positive, realistic, and empowering self-talk. Target Date: 2024-01-19 Frequency: Daily  Progress: 60 Modality: individual  Related Interventions Explore the client's schema and self-talk that mediate his/her fear response; assist him/her in challenging the biases; replace the distorted messages with reality-based alternatives and positive, realistic self-talk that will increase his/her self-confidence in coping with irrational fears (see Cognitive Therapy of Anxiety Disorders by Laurence Slate).  3. difficulty w/ being assertive and setting boundaries  Objective Increase the frequency of assertive behaviors and setting healthy boundaries Target Date: 2024-01-19 Frequency: Daily  Progress: 40 Modality: individual  Related Interventions  Teach assertive communication and healthy boundaries and ways to impelent   4. Identify personal goals for job/career and wants for self and direction wants to take.  Objective Increase pt awareness of her values, wants, interests and opportunities that are congruent with.    Target Date: 2024-01-19 Frequency: Daily  Progress: 0 Modality: individual  Related Interventions Assist pt in identifying what she values and explore opportunities and problem  solving/planning to take steps towards identified wants.      Pt participated in tx plan and provided verbal consent.      Forde Radon, [facility]

## 2023-02-22 ENCOUNTER — Ambulatory Visit: Payer: BLUE CROSS/BLUE SHIELD | Admitting: Nurse Practitioner

## 2023-03-02 ENCOUNTER — Ambulatory Visit (INDEPENDENT_AMBULATORY_CARE_PROVIDER_SITE_OTHER): Payer: BLUE CROSS/BLUE SHIELD | Admitting: Psychology

## 2023-03-02 DIAGNOSIS — F319 Bipolar disorder, unspecified: Secondary | ICD-10-CM

## 2023-03-02 NOTE — Progress Notes (Addendum)
[facility] Counselor/Therapist Progress Note  Patient ID: Victoria [name], MRN: [id number],    Date: 03/02/2023  Time Spent: 11:07am-11:38am   Treatment Type: Individual Therapy  pt is seen for a virtual video visit via caregility.  Pt joins from her home and counselor from her home office.   Reported Symptoms: identified steps for towards change Mental Status Exam: Appearance:  Well Groomed     Behavior: Appropriate  Motor: Normal  Speech/Language:  Normal Rate  Affect: Appropriate  Mood: overwhelmed  Thought process: normal  Thought content:   WNL  Sensory/Perceptual disturbances:   WNL  Orientation: oriented to person, place, time/date, and situation  Attention: Good  Concentration: Good  Memory: WNL  Fund of knowledge:  Good  Insight:   Good  Judgment:  Good  Impulse Control: Good   Risk Assessment: Danger to Self:  No Self-injurious Behavior: No Danger to Others: No Duty to Warn:no Physical Aggression / Violence:No  Access to Firearms a concern: No  Gang Involvement:No   Subjective: Counselor assessed pt current functioning per pt report.  Processed w/ pt mood, stressors and positives.  Explored stressors w/ fiances and how overwhelmed and not taking steps.  Assisted pt w/ identifying one things to work towards in a week. Encouraged pt to explored providers that are in her insurance network instead of avoiding bills/tx.   Pt affect wnl.  Pt reported that her mood has been ok.  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  Pt reported realizing also that her insurance is not covering [facility] providers and has been ignoring bills in mail and had to cancel her cardiologist f/u as couldn't afford.  Pt identified that she can find providers in her network/[facility] and to focus on that this week.       Interventions: Cognitive Behavioral Therapy, Assertiveness/Communication, and  Solution-Oriented/Positive Psychology  Diagnosis:Bipolar I disorder (HCC)  Plan: No f/u scheduled w/ this provider at this time as OON for pt.  Pt reports she will find provider in [facility] network and will f/u w/ counselor by email if needs to schedule.     Treatment Plan  01/13/22  Client Abilities/Strengths  supports: my maternal uncle and his friend [name] and my best friend- who I met in college. pt enjoys her dogs, reading/journaling.  advocating for herself.   Client Treatment Preferences  Counseling 1-2 times a month. pt to continue w/ PCP to manage meds. continue to f/u w/ cardiologist.   Client Statement of Needs  Pt "to continue to work through understanding patterns that I get stuck in and change those. I have struggled with figuring out what I want for self personally and  I wan to be able to work towards that- to have a new job, direction and figure that out".  Treatment Level  outpatient counseling   Symptoms  Depressed or irritable mood. Difficulty being assertive:    History of at least one hypomanic, manic, or mixed mood episode. Improved Hypervigilance (e.g., feeling constantly on edge, experiencing concentration difficulties, having trouble falling or staying asleep, exhibiting a general state of irritability).  Lack of energy.  Motor tension (e.g., restlessness, tiredness, shakiness, muscle tension).  Sleeplessness or hypersomnia.   Problems Addressed  Bipolar Disorder - Depression, Anxiety, Assertiveness,   Goals  1. Develop healthy cognitive patterns and beliefs about self and the world that lead to alleviation and help prevent the relapse  of mood episodes.  Objective Identify and replace thoughts and behaviors that trigger manic or depressive symptoms. Target Date: 2024-01-19 Frequency: Daily  Progress: 50 Modality: individual  Related Interventions  Use cognitive therapy techniques to explore and educate the client about cognitive biases that trigger  his/her elevated or depressive mood (see Cognitive Therapy for Bipolar Disorder by Aggie Hacker al.).  2. Enhance ability to effectively cope with the full variety of life's worries and anxieties.  Objective Learn and implement problem-solving strategies for realistically addressing worries. Target Date: 2024-01-19 Frequency: Daily  Progress: 60 Modality: individual  Related Interventions Teach the client problem-solving strategies involving specifically defining a problem, generating options for addressing it, evaluating the pros and cons of each option, selecting and implementing an optional action, and reevaluating and refining the action (or assign "Applying Problem-Solving to Interpersonal Conflict" in the Adult Psychotherapy Homework Planner by Stephannie Li).  Objective Identify, challenge, and replace biased, fearful self-talk with positive, realistic, and empowering self-talk. Target Date: 2024-01-19 Frequency: Daily  Progress: 60 Modality: individual  Related Interventions Explore the client's schema and self-talk that mediate his/her fear response; assist him/her in challenging the biases; replace the distorted messages with reality-based alternatives and positive, realistic self-talk that will increase his/her self-confidence in coping with irrational fears (see Cognitive Therapy of Anxiety Disorders by Laurence Slate).  3. difficulty w/ being assertive and setting boundaries  Objective Increase the frequency of assertive behaviors and setting healthy boundaries Target Date: 2024-01-19 Frequency: Daily  Progress: 40 Modality: individual  Related Interventions  Teach assertive communication and healthy boundaries and ways to impelent   4. Identify personal goals for job/career and wants for self and direction wants to take.  Objective Increase pt awareness of her values, wants, interests and opportunities that are congruent with.    Target Date: 2024-01-19 Frequency: Daily  Progress:  0 Modality: individual  Related Interventions Assist pt in identifying what she values and explore opportunities and problem solving/planning to take steps towards identified wants.      Pt participated in tx plan and provided verbal consent.      [facility]               [place], [facility]

## 2023-03-27 ENCOUNTER — Other Ambulatory Visit: Payer: Self-pay | Admitting: Internal Medicine

## 2023-03-27 DIAGNOSIS — G90A Postural orthostatic tachycardia syndrome (POTS): Secondary | ICD-10-CM

## 2023-07-04 ENCOUNTER — Ambulatory Visit: Payer: BLUE CROSS/BLUE SHIELD | Admitting: Internal Medicine
